# Patient Record
Sex: Male | Born: 1960 | Race: White | Hispanic: No | Marital: Married | Smoking: Former smoker
Health system: Southern US, Community
[De-identification: ages and names within clinical notes are randomized; demographics above are authoritative.]

## PROBLEM LIST (undated history)

## (undated) DIAGNOSIS — F32A Depression, unspecified: Secondary | ICD-10-CM

## (undated) DIAGNOSIS — K6289 Other specified diseases of anus and rectum: Secondary | ICD-10-CM

## (undated) DIAGNOSIS — C4359 Malignant melanoma of other part of trunk: Secondary | ICD-10-CM

## (undated) DIAGNOSIS — Z87820 Personal history of traumatic brain injury: Secondary | ICD-10-CM

## (undated) DIAGNOSIS — M1990 Unspecified osteoarthritis, unspecified site: Secondary | ICD-10-CM

## (undated) DIAGNOSIS — F329 Major depressive disorder, single episode, unspecified: Secondary | ICD-10-CM

## (undated) DIAGNOSIS — J189 Pneumonia, unspecified organism: Secondary | ICD-10-CM

## (undated) DIAGNOSIS — K219 Gastro-esophageal reflux disease without esophagitis: Secondary | ICD-10-CM

## (undated) DIAGNOSIS — F419 Anxiety disorder, unspecified: Secondary | ICD-10-CM

## (undated) DIAGNOSIS — R32 Unspecified urinary incontinence: Secondary | ICD-10-CM

## (undated) HISTORY — PX: TONSILLECTOMY: SUR1361

## (undated) HISTORY — DX: Unspecified urinary incontinence: R32

## (undated) HISTORY — PX: SUBTALAR JOINT ARTHROEREISIS: SHX6201

## (undated) HISTORY — DX: Personal history of traumatic brain injury: Z87.820

## (undated) HISTORY — PX: LAPAROSCOPIC CHOLECYSTECTOMY: SUR755

## (undated) HISTORY — DX: Other specified diseases of anus and rectum: K62.89

## (undated) HISTORY — PX: FRACTURE SURGERY: SHX138

## (undated) HISTORY — DX: Malignant melanoma of other part of trunk: C43.59

## (undated) HISTORY — PX: ANKLE ARTHROSCOPY: SHX545

## (undated) HISTORY — DX: Major depressive disorder, single episode, unspecified: F32.9

---

## 1980-12-03 HISTORY — PX: ANKLE FRACTURE SURGERY: SHX122

## 2011-06-19 ENCOUNTER — Other Ambulatory Visit: Payer: Self-pay | Admitting: Orthopedic Surgery

## 2011-06-19 ENCOUNTER — Ambulatory Visit
Admission: RE | Admit: 2011-06-19 | Discharge: 2011-06-19 | Disposition: A | Discharge status: Home / Self Care | Payer: BC Managed Care – PPO | Source: Ambulatory Visit | Attending: Orthopedic Surgery | Admitting: Orthopedic Surgery

## 2011-06-19 DIAGNOSIS — M25572 Pain in left ankle and joints of left foot: Secondary | ICD-10-CM

## 2011-06-19 DIAGNOSIS — M25579 Pain in unspecified ankle and joints of unspecified foot: Secondary | ICD-10-CM

## 2012-09-21 ENCOUNTER — Ambulatory Visit: Payer: BC Managed Care – PPO | Attending: Orthopedic Surgery | Admitting: Rehabilitation

## 2012-09-21 DIAGNOSIS — M25673 Stiffness of unspecified ankle, not elsewhere classified: Secondary | ICD-10-CM | POA: Insufficient documentation

## 2012-09-21 DIAGNOSIS — IMO0001 Reserved for inherently not codable concepts without codable children: Secondary | ICD-10-CM | POA: Insufficient documentation

## 2012-09-21 DIAGNOSIS — M25676 Stiffness of unspecified foot, not elsewhere classified: Secondary | ICD-10-CM | POA: Insufficient documentation

## 2012-09-21 DIAGNOSIS — M25579 Pain in unspecified ankle and joints of unspecified foot: Secondary | ICD-10-CM | POA: Insufficient documentation

## 2012-10-03 ENCOUNTER — Ambulatory Visit: Payer: BC Managed Care – PPO | Admitting: Rehabilitation

## 2012-10-06 ENCOUNTER — Ambulatory Visit: Payer: BC Managed Care – PPO | Attending: Orthopedic Surgery | Admitting: Rehabilitation

## 2012-10-06 DIAGNOSIS — M25579 Pain in unspecified ankle and joints of unspecified foot: Secondary | ICD-10-CM | POA: Insufficient documentation

## 2012-10-06 DIAGNOSIS — M25676 Stiffness of unspecified foot, not elsewhere classified: Secondary | ICD-10-CM | POA: Insufficient documentation

## 2012-10-06 DIAGNOSIS — IMO0001 Reserved for inherently not codable concepts without codable children: Secondary | ICD-10-CM | POA: Insufficient documentation

## 2012-10-06 DIAGNOSIS — M25673 Stiffness of unspecified ankle, not elsewhere classified: Secondary | ICD-10-CM | POA: Insufficient documentation

## 2012-10-10 ENCOUNTER — Ambulatory Visit: Payer: BC Managed Care – PPO | Admitting: Rehabilitation

## 2012-10-13 ENCOUNTER — Ambulatory Visit: Payer: BC Managed Care – PPO | Admitting: Physical Therapy

## 2012-10-17 ENCOUNTER — Ambulatory Visit: Payer: BC Managed Care – PPO | Admitting: Rehabilitation

## 2012-10-20 ENCOUNTER — Ambulatory Visit: Payer: BC Managed Care – PPO | Admitting: Rehabilitation

## 2012-10-24 ENCOUNTER — Ambulatory Visit: Payer: BC Managed Care – PPO | Admitting: Rehabilitation

## 2012-10-27 ENCOUNTER — Ambulatory Visit: Payer: BC Managed Care – PPO | Admitting: Rehabilitation

## 2012-10-31 ENCOUNTER — Ambulatory Visit: Payer: BC Managed Care – PPO | Admitting: Rehabilitation

## 2012-11-03 ENCOUNTER — Ambulatory Visit: Payer: BC Managed Care – PPO | Admitting: Rehabilitation

## 2012-11-07 ENCOUNTER — Ambulatory Visit: Payer: BC Managed Care – PPO | Attending: Orthopedic Surgery | Admitting: Rehabilitation

## 2012-11-07 DIAGNOSIS — IMO0001 Reserved for inherently not codable concepts without codable children: Secondary | ICD-10-CM | POA: Insufficient documentation

## 2012-11-07 DIAGNOSIS — M25579 Pain in unspecified ankle and joints of unspecified foot: Secondary | ICD-10-CM | POA: Insufficient documentation

## 2012-11-07 DIAGNOSIS — M25676 Stiffness of unspecified foot, not elsewhere classified: Secondary | ICD-10-CM | POA: Insufficient documentation

## 2012-11-07 DIAGNOSIS — M25673 Stiffness of unspecified ankle, not elsewhere classified: Secondary | ICD-10-CM | POA: Insufficient documentation

## 2012-11-10 ENCOUNTER — Ambulatory Visit: Payer: BC Managed Care – PPO | Admitting: Rehabilitation

## 2012-11-14 ENCOUNTER — Ambulatory Visit: Payer: BC Managed Care – PPO | Admitting: Rehabilitation

## 2012-11-17 ENCOUNTER — Ambulatory Visit: Payer: BC Managed Care – PPO | Admitting: Rehabilitation

## 2012-11-21 ENCOUNTER — Ambulatory Visit: Payer: BC Managed Care – PPO | Admitting: Rehabilitation

## 2012-11-24 ENCOUNTER — Ambulatory Visit: Payer: BC Managed Care – PPO | Admitting: Rehabilitation

## 2012-11-28 ENCOUNTER — Ambulatory Visit: Payer: BC Managed Care – PPO | Admitting: Rehabilitation

## 2012-12-01 ENCOUNTER — Encounter: Payer: BC Managed Care – PPO | Admitting: Rehabilitation

## 2012-12-05 ENCOUNTER — Encounter: Payer: BC Managed Care – PPO | Admitting: Rehabilitation

## 2012-12-08 ENCOUNTER — Ambulatory Visit: Payer: BC Managed Care – PPO | Attending: Orthopedic Surgery | Admitting: Rehabilitation

## 2012-12-08 DIAGNOSIS — IMO0001 Reserved for inherently not codable concepts without codable children: Secondary | ICD-10-CM | POA: Insufficient documentation

## 2012-12-08 DIAGNOSIS — M25673 Stiffness of unspecified ankle, not elsewhere classified: Secondary | ICD-10-CM | POA: Insufficient documentation

## 2012-12-08 DIAGNOSIS — M25579 Pain in unspecified ankle and joints of unspecified foot: Secondary | ICD-10-CM | POA: Insufficient documentation

## 2012-12-08 DIAGNOSIS — M25676 Stiffness of unspecified foot, not elsewhere classified: Secondary | ICD-10-CM | POA: Insufficient documentation

## 2012-12-12 ENCOUNTER — Ambulatory Visit: Payer: BC Managed Care – PPO | Admitting: Rehabilitation

## 2012-12-14 ENCOUNTER — Encounter: Payer: BC Managed Care – PPO | Admitting: Rehabilitation

## 2012-12-14 ENCOUNTER — Ambulatory Visit: Payer: BC Managed Care – PPO | Admitting: Rehabilitation

## 2012-12-15 ENCOUNTER — Encounter: Payer: BC Managed Care – PPO | Admitting: Rehabilitation

## 2012-12-26 ENCOUNTER — Ambulatory Visit: Payer: BC Managed Care – PPO | Admitting: Rehabilitation

## 2012-12-28 ENCOUNTER — Ambulatory Visit: Payer: BC Managed Care – PPO | Admitting: Rehabilitation

## 2013-01-04 ENCOUNTER — Ambulatory Visit: Payer: BC Managed Care – PPO | Attending: Orthopedic Surgery | Admitting: Rehabilitation

## 2013-01-04 DIAGNOSIS — M25579 Pain in unspecified ankle and joints of unspecified foot: Secondary | ICD-10-CM | POA: Insufficient documentation

## 2013-01-04 DIAGNOSIS — M25673 Stiffness of unspecified ankle, not elsewhere classified: Secondary | ICD-10-CM | POA: Insufficient documentation

## 2013-01-04 DIAGNOSIS — IMO0001 Reserved for inherently not codable concepts without codable children: Secondary | ICD-10-CM | POA: Insufficient documentation

## 2013-01-04 DIAGNOSIS — M25676 Stiffness of unspecified foot, not elsewhere classified: Secondary | ICD-10-CM | POA: Insufficient documentation

## 2013-01-10 ENCOUNTER — Ambulatory Visit: Payer: BC Managed Care – PPO | Admitting: Rehabilitation

## 2013-01-16 ENCOUNTER — Ambulatory Visit: Payer: BC Managed Care – PPO | Admitting: Rehabilitation

## 2013-01-23 ENCOUNTER — Encounter: Payer: BC Managed Care – PPO | Admitting: Rehabilitation

## 2015-06-07 DIAGNOSIS — J3089 Other allergic rhinitis: Secondary | ICD-10-CM | POA: Insufficient documentation

## 2015-06-07 DIAGNOSIS — F411 Generalized anxiety disorder: Secondary | ICD-10-CM | POA: Insufficient documentation

## 2015-06-07 DIAGNOSIS — B009 Herpesviral infection, unspecified: Secondary | ICD-10-CM | POA: Insufficient documentation

## 2015-06-07 HISTORY — DX: Other allergic rhinitis: J30.89

## 2015-06-07 HISTORY — DX: Generalized anxiety disorder: F41.1

## 2015-06-07 HISTORY — DX: Herpesviral infection, unspecified: B00.9

## 2015-09-11 ENCOUNTER — Other Ambulatory Visit: Payer: Self-pay | Admitting: Orthopedic Surgery

## 2015-09-11 DIAGNOSIS — M19072 Primary osteoarthritis, left ankle and foot: Secondary | ICD-10-CM

## 2015-09-16 ENCOUNTER — Ambulatory Visit
Admission: RE | Admit: 2015-09-16 | Discharge: 2015-09-16 | Disposition: A | Discharge status: Home / Self Care | Payer: Self-pay | Source: Ambulatory Visit | Attending: Orthopedic Surgery | Admitting: Orthopedic Surgery

## 2015-09-16 DIAGNOSIS — M19072 Primary osteoarthritis, left ankle and foot: Secondary | ICD-10-CM

## 2015-10-06 DIAGNOSIS — J189 Pneumonia, unspecified organism: Secondary | ICD-10-CM

## 2015-10-06 HISTORY — DX: Pneumonia, unspecified organism: J18.9

## 2016-06-14 DIAGNOSIS — E782 Mixed hyperlipidemia: Secondary | ICD-10-CM

## 2016-06-14 HISTORY — DX: Mixed hyperlipidemia: E78.2

## 2016-08-18 ENCOUNTER — Ambulatory Visit
Admission: RE | Admit: 2016-08-18 | Discharge: 2016-08-18 | Disposition: A | Discharge status: Home / Self Care | Payer: BC Managed Care – PPO | Source: Ambulatory Visit | Attending: Physician Assistant | Admitting: Physician Assistant

## 2016-08-18 ENCOUNTER — Other Ambulatory Visit: Payer: Self-pay | Admitting: Physician Assistant

## 2016-08-18 DIAGNOSIS — M546 Pain in thoracic spine: Secondary | ICD-10-CM

## 2017-01-21 ENCOUNTER — Ambulatory Visit (INDEPENDENT_AMBULATORY_CARE_PROVIDER_SITE_OTHER): Payer: BC Managed Care – PPO | Admitting: Orthopedic Surgery

## 2017-01-21 ENCOUNTER — Ambulatory Visit (INDEPENDENT_AMBULATORY_CARE_PROVIDER_SITE_OTHER): Payer: BC Managed Care – PPO

## 2017-01-21 DIAGNOSIS — M25572 Pain in left ankle and joints of left foot: Secondary | ICD-10-CM

## 2017-01-21 DIAGNOSIS — M24672 Ankylosis, left ankle: Secondary | ICD-10-CM

## 2017-01-21 DIAGNOSIS — M79671 Pain in right foot: Secondary | ICD-10-CM | POA: Diagnosis not present

## 2017-01-21 HISTORY — DX: Ankylosis, left ankle: M24.672

## 2017-01-21 HISTORY — DX: Pain in right foot: M79.671

## 2017-01-21 NOTE — Progress Notes (Signed)
Office Visit Note   Patient: Tom White           Date of Birth: 1960/12/28           MRN: [id number] Visit Date: 01/21/2017              Requested by: No referring provider defined for this encounter. PCP: Chesley Noon, MD  Chief Complaint  Patient presents with  . Right Ankle - Pain  . Left Ankle - Pain      HPI: Patient is a 56 year old gentleman who complains of subtalar pain on the left as well as posterior pain along the subtalar joint. Patient also complains of calcaneal pain on the right side. Patient states that he may take a job starting in a month which would require him to be on his feet and using steel toed shoes.  Assessment & Plan: Visit Diagnoses:  1. Inflammatory heel pain, right   2. Acute left ankle pain   3. Fibrosis of subtalar joint, left     Plan: Recommended a stiff soled walking or running shoe with orthotics to help cushion the stress reaction in the right calcaneus. Recommended using an ASO stabilizer on the left foot. Patient states that his subtalar joint is less painful with an ASO. Discussed that if we proceeded with revision subtalar fusion from a posterior approach the patient would be off his foot for about 2 months. Patient states he cannot take this much time off from his new job.  Follow-Up Instructions: Return if symptoms worsen or fail to improve.   Ortho Exam  Patient is alert, oriented, no adenopathy, well-dressed, normal affect, normal respiratory effort. Patient has a normal gait. He has good pulses bilaterally. Patient has pain reproduced with attempted subtalar motion on the left. He has pain to palpation over the subtalar joint posteriorly and over the sinus Tarsi this reproduces his symptoms. Patient has no tenderness to palpation over the Achilles posterior tibial tendon or peroneal tendons on the left. Examination of right foot patient is point tender with lateral compression of the calcaneus. The tarsal tunnel is nontender to  palpation the Achilles tendon is nontender to palpation the plantar fascias nontender to palpation the peroneal or posterior tibial tendon are nontender to palpation.  Imaging: Xr Ankle Complete Left  Result Date: 01/21/2017 2 view radiographs of the left ankle shows some mild arthritic changes of the tibial talar joint. Patient has advanced subtalar arthrosis status post attempted subtalar fusion with a prominent staple in place.  Xr Foot 2 Views Right  Result Date: 01/21/2017 2 view radiographs of the right foot shows no evidence of any stress fractures of the calcaneus there is no bony abnormalities.   Labs: No results found for: HGBA1C, ESRSEDRATE, CRP, LABURIC, REPTSTATUS, GRAMSTAIN, CULT, LABORGA  Orders:  Orders Placed This Encounter  Procedures  . XR Ankle Complete Left  . XR Foot 2 Views Right   No orders of the defined types were placed in this encounter.    Procedures: No procedures performed  Clinical Data: No additional findings.  ROS:  All other systems negative, except as noted in the HPI. Review of Systems  Objective: Vital Signs: There were no vitals taken for this visit.  Specialty Comments:  No specialty comments available.  PMFS History: Patient Active Problem List   Diagnosis Date Noted  . 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 Inflammatory heel pain, right 01/21/2017   No past medical history on file.  No family  history on file.  No past surgical history on file. Social History   Occupational History  . Not on file.   Social History Main Topics  . Smoking status: Not on file  . Smokeless tobacco: Not on file  . Alcohol use Not on file  . Drug use: Unknown  . Sexual activity: Not on file

## 2017-05-03 ENCOUNTER — Emergency Department (HOSPITAL_COMMUNITY): Payer: BC Managed Care – PPO

## 2017-05-03 ENCOUNTER — Encounter (HOSPITAL_COMMUNITY)
Admission: EM | Disposition: A | Discharge status: Home / Self Care | Payer: Self-pay | Source: Home / Self Care | Attending: Emergency Medicine

## 2017-05-03 ENCOUNTER — Emergency Department (HOSPITAL_COMMUNITY): Payer: BC Managed Care – PPO | Admitting: Certified Registered Nurse Anesthetist

## 2017-05-03 ENCOUNTER — Observation Stay (HOSPITAL_COMMUNITY)
Admission: EM | Admit: 2017-05-03 | Discharge: 2017-05-04 | Disposition: A | Discharge status: Home / Self Care | Payer: BC Managed Care – PPO | Attending: Orthopaedic Surgery | Admitting: Orthopaedic Surgery

## 2017-05-03 ENCOUNTER — Encounter (HOSPITAL_COMMUNITY): Payer: Self-pay | Admitting: *Deleted

## 2017-05-03 DIAGNOSIS — S91319A Laceration without foreign body, unspecified foot, initial encounter: Secondary | ICD-10-CM | POA: Diagnosis present

## 2017-05-03 DIAGNOSIS — Z881 Allergy status to other antibiotic agents status: Secondary | ICD-10-CM | POA: Insufficient documentation

## 2017-05-03 DIAGNOSIS — K219 Gastro-esophageal reflux disease without esophagitis: Secondary | ICD-10-CM | POA: Diagnosis not present

## 2017-05-03 DIAGNOSIS — S86121A Laceration of other muscle(s) and tendon(s) of posterior muscle group at lower leg level, right leg, initial encounter: Secondary | ICD-10-CM | POA: Diagnosis not present

## 2017-05-03 DIAGNOSIS — Z79899 Other long term (current) drug therapy: Secondary | ICD-10-CM | POA: Insufficient documentation

## 2017-05-03 DIAGNOSIS — S91311A Laceration without foreign body, right foot, initial encounter: Secondary | ICD-10-CM | POA: Diagnosis not present

## 2017-05-03 DIAGNOSIS — M1990 Unspecified osteoarthritis, unspecified site: Secondary | ICD-10-CM | POA: Insufficient documentation

## 2017-05-03 DIAGNOSIS — W293XXA Contact with powered garden and outdoor hand tools and machinery, initial encounter: Secondary | ICD-10-CM | POA: Insufficient documentation

## 2017-05-03 DIAGNOSIS — F419 Anxiety disorder, unspecified: Secondary | ICD-10-CM | POA: Insufficient documentation

## 2017-05-03 HISTORY — DX: Gastro-esophageal reflux disease without esophagitis: K21.9

## 2017-05-03 HISTORY — DX: Pneumonia, unspecified organism: J18.9

## 2017-05-03 HISTORY — DX: Depression, unspecified: F32.A

## 2017-05-03 HISTORY — PX: POSTERIOR TIBIAL TENDON REPAIR: SHX6039

## 2017-05-03 HISTORY — DX: Unspecified osteoarthritis, unspecified site: M19.90

## 2017-05-03 HISTORY — PX: I & D EXTREMITY: SHX5045

## 2017-05-03 HISTORY — DX: Anxiety disorder, unspecified: F41.9

## 2017-05-03 HISTORY — DX: Major depressive disorder, single episode, unspecified: F32.9

## 2017-05-03 HISTORY — PX: APPLICATION OF WOUND VAC: SHX5189

## 2017-05-03 LAB — BASIC METABOLIC PANEL
Anion gap: 8 (ref 5–15)
BUN: 11 mg/dL (ref 6–20)
CO2: 26 mmol/L (ref 22–32)
Calcium: 8.7 mg/dL — ABNORMAL LOW (ref 8.9–10.3)
Chloride: 104 mmol/L (ref 101–111)
Creatinine, Ser: 0.95 mg/dL (ref 0.61–1.24)
GFR calc Af Amer: >60 mL/min (ref >60)
GFR calc non Af Amer: >60 mL/min (ref >60)
Glucose, Bld: 106 mg/dL — ABNORMAL HIGH (ref 65–99)
Potassium: 4 mmol/L (ref 3.5–5.1)
Sodium: 138 mmol/L (ref 135–145)

## 2017-05-03 LAB — CBC WITH DIFFERENTIAL/PLATELET
Basophils Absolute: 0 10*3/uL (ref 0.0–0.1)
Basophils Relative: 0 %
Eosinophils Absolute: 0.1 10*3/uL (ref 0.0–0.7)
Eosinophils Relative: 1 %
HCT: 40.7 % (ref 39.0–52.0)
Hemoglobin: 14 g/dL (ref 13.0–17.0)
Lymphocytes Relative: 15 %
Lymphs Abs: 1 10*3/uL (ref 0.7–4.0)
MCH: 29.9 pg (ref 26.0–34.0)
MCHC: 34.4 g/dL (ref 30.0–36.0)
MCV: 86.8 fL (ref 78.0–100.0)
Monocytes Absolute: 0.6 10*3/uL (ref 0.1–1.0)
Monocytes Relative: 9 %
Neutro Abs: 5.1 10*3/uL (ref 1.7–7.7)
Neutrophils Relative %: 75 %
Platelets: 183 10*3/uL (ref 150–400)
RBC: 4.69 MIL/uL (ref 4.22–5.81)
RDW: 12.6 % (ref 11.5–15.5)
WBC: 6.8 10*3/uL (ref 4.0–10.5)

## 2017-05-03 SURGERY — IRRIGATION AND DEBRIDEMENT EXTREMITY
Anesthesia: General | Site: Foot | Laterality: Right

## 2017-05-03 MED ORDER — SUCCINYLCHOLINE CHLORIDE 20 MG/ML IJ SOLN
INTRAMUSCULAR | Status: DC | PRN
  Administered 2017-05-03: 120 mg via INTRAVENOUS

## 2017-05-03 MED ORDER — METOCLOPRAMIDE HCL 5 MG PO TABS: 5.0000 mg | ORAL_TABLET | Freq: Three times a day (TID) | ORAL | Status: DC | PRN

## 2017-05-03 MED ORDER — OXYCODONE HCL 5 MG PO TABS: 5.0000 mg | ORAL_TABLET | Freq: Once | ORAL | Status: DC | PRN

## 2017-05-03 MED ORDER — CLINDAMYCIN PHOSPHATE 900 MG/50ML IV SOLN
900.0000 mg | INTRAVENOUS | Status: AC
  Administered 2017-05-03: 900 mg via INTRAVENOUS

## 2017-05-03 MED ORDER — DOCUSATE SODIUM 100 MG PO CAPS
100.0000 mg | ORAL_CAPSULE | Freq: Two times a day (BID) | ORAL | Status: DC
  Administered 2017-05-03 – 2017-05-04 (×2): 100 mg via ORAL
  Filled 2017-05-03 (×2): qty 1

## 2017-05-03 MED ORDER — SODIUM CHLORIDE 0.9 % IR SOLN
Status: DC | PRN
  Administered 2017-05-03: 1000 mL
  Administered 2017-05-03: 3000 mL

## 2017-05-03 MED ORDER — LORAZEPAM 2 MG/ML IJ SOLN
1.0000 mg | Freq: Once | INTRAMUSCULAR | Status: AC
  Administered 2017-05-03: 1 mg via INTRAVENOUS
  Filled 2017-05-03: qty 1

## 2017-05-03 MED ORDER — LACTATED RINGERS IV SOLN
INTRAVENOUS | Status: DC | PRN
  Administered 2017-05-03: 16:00:00 via INTRAVENOUS

## 2017-05-03 MED ORDER — ONDANSETRON HCL 4 MG/2ML IJ SOLN: 4.0000 mg | Freq: Four times a day (QID) | INTRAMUSCULAR | Status: DC | PRN

## 2017-05-03 MED ORDER — HYDROMORPHONE HCL 1 MG/ML IJ SOLN
0.2500 mg | INTRAMUSCULAR | Status: DC | PRN
  Administered 2017-05-03: 0.5 mg via INTRAVENOUS

## 2017-05-03 MED ORDER — MIDAZOLAM HCL 2 MG/2ML IJ SOLN
INTRAMUSCULAR | Status: AC
  Filled 2017-05-03: qty 2

## 2017-05-03 MED ORDER — ONDANSETRON HCL 4 MG PO TABS: 4.0000 mg | ORAL_TABLET | Freq: Four times a day (QID) | ORAL | Status: DC | PRN

## 2017-05-03 MED ORDER — OXYCODONE HCL 5 MG/5ML PO SOLN: 5.0000 mg | Freq: Once | ORAL | Status: DC | PRN

## 2017-05-03 MED ORDER — PROPOFOL 10 MG/ML IV BOLUS
INTRAVENOUS | Status: DC | PRN
  Administered 2017-05-03: 200 mg via INTRAVENOUS

## 2017-05-03 MED ORDER — CLINDAMYCIN PHOSPHATE 600 MG/50ML IV SOLN
600.0000 mg | Freq: Once | INTRAVENOUS | Status: AC
  Administered 2017-05-03: 600 mg via INTRAVENOUS
  Filled 2017-05-03: qty 50

## 2017-05-03 MED ORDER — METHOCARBAMOL 500 MG PO TABS
500.0000 mg | ORAL_TABLET | Freq: Four times a day (QID) | ORAL | Status: DC | PRN
  Filled 2017-05-03: qty 1

## 2017-05-03 MED ORDER — SODIUM CHLORIDE 0.9 % IV SOLN: INTRAVENOUS | Status: DC

## 2017-05-03 MED ORDER — LIDOCAINE 2% (20 MG/ML) 5 ML SYRINGE
INTRAMUSCULAR | Status: AC
  Filled 2017-05-03: qty 5

## 2017-05-03 MED ORDER — ONDANSETRON HCL 4 MG/2ML IJ SOLN
INTRAMUSCULAR | Status: AC
  Filled 2017-05-03: qty 2

## 2017-05-03 MED ORDER — LIDOCAINE-EPINEPHRINE 1 %-1:100000 IJ SOLN
10.0000 mL | Freq: Once | INTRAMUSCULAR | Status: DC
  Filled 2017-05-03: qty 10

## 2017-05-03 MED ORDER — ACETAMINOPHEN 325 MG PO TABS: 650.0000 mg | ORAL_TABLET | Freq: Four times a day (QID) | ORAL | Status: DC | PRN

## 2017-05-03 MED ORDER — CLINDAMYCIN PHOSPHATE 600 MG/50ML IV SOLN
600.0000 mg | Freq: Four times a day (QID) | INTRAVENOUS | Status: AC
  Administered 2017-05-03 – 2017-05-04 (×3): 600 mg via INTRAVENOUS
  Filled 2017-05-03 (×3): qty 50

## 2017-05-03 MED ORDER — CHLORHEXIDINE GLUCONATE 4 % EX LIQD: 60.0000 mL | Freq: Once | CUTANEOUS | Status: DC

## 2017-05-03 MED ORDER — LIDOCAINE HCL (PF) 1 % IJ SOLN
INTRAMUSCULAR | Status: AC
  Administered 2017-05-03: 30 mL
  Filled 2017-05-03: qty 30

## 2017-05-03 MED ORDER — POLYETHYLENE GLYCOL 3350 17 G PO PACK: 17.0000 g | PACK | Freq: Every day | ORAL | Status: DC | PRN

## 2017-05-03 MED ORDER — DEXAMETHASONE SODIUM PHOSPHATE 10 MG/ML IJ SOLN
INTRAMUSCULAR | Status: DC | PRN
  Administered 2017-05-03: 5 mg via INTRAVENOUS

## 2017-05-03 MED ORDER — CITALOPRAM HYDROBROMIDE 20 MG PO TABS
20.0000 mg | ORAL_TABLET | Freq: Every day | ORAL | Status: DC
  Administered 2017-05-03: 20 mg via ORAL
  Filled 2017-05-03: qty 1

## 2017-05-03 MED ORDER — HYDROMORPHONE HCL 1 MG/ML IJ SOLN
INTRAMUSCULAR | Status: AC
  Administered 2017-05-03: 0.5 mg via INTRAVENOUS
  Filled 2017-05-03: qty 1

## 2017-05-03 MED ORDER — OXYCODONE HCL 5 MG PO TABS
5.0000 mg | ORAL_TABLET | ORAL | Status: DC | PRN
  Administered 2017-05-04: 5 mg via ORAL
  Filled 2017-05-03: qty 2
  Filled 2017-05-03: qty 1

## 2017-05-03 MED ORDER — DEXAMETHASONE SODIUM PHOSPHATE 10 MG/ML IJ SOLN
INTRAMUSCULAR | Status: AC
  Filled 2017-05-03: qty 1

## 2017-05-03 MED ORDER — METHOCARBAMOL 1000 MG/10ML IJ SOLN: 500.0000 mg | Freq: Four times a day (QID) | INTRAVENOUS | Status: DC | PRN

## 2017-05-03 MED ORDER — HYDROMORPHONE HCL 1 MG/ML IJ SOLN
1.0000 mg | Freq: Once | INTRAMUSCULAR | Status: AC
  Administered 2017-05-03: 1 mg via INTRAVENOUS
  Filled 2017-05-03: qty 1

## 2017-05-03 MED ORDER — SODIUM CHLORIDE 0.9 % IV BOLUS (SEPSIS)
1000.0000 mL | Freq: Once | INTRAVENOUS | Status: AC
  Administered 2017-05-03: 1000 mL via INTRAVENOUS

## 2017-05-03 MED ORDER — MIDAZOLAM HCL 5 MG/5ML IJ SOLN
INTRAMUSCULAR | Status: DC | PRN
  Administered 2017-05-03: 2 mg via INTRAVENOUS

## 2017-05-03 MED ORDER — POVIDONE-IODINE 10 % EX SWAB: 2.0000 "application " | Freq: Once | CUTANEOUS | Status: DC

## 2017-05-03 MED ORDER — ONDANSETRON HCL 4 MG/2ML IJ SOLN
INTRAMUSCULAR | Status: DC | PRN
  Administered 2017-05-03: 4 mg via INTRAVENOUS

## 2017-05-03 MED ORDER — FENTANYL CITRATE (PF) 100 MCG/2ML IJ SOLN
INTRAMUSCULAR | Status: DC | PRN
  Administered 2017-05-03: 50 ug via INTRAVENOUS
  Administered 2017-05-03: 150 ug via INTRAVENOUS

## 2017-05-03 MED ORDER — ACETAMINOPHEN 650 MG RE SUPP: 650.0000 mg | Freq: Four times a day (QID) | RECTAL | Status: DC | PRN

## 2017-05-03 MED ORDER — FENTANYL CITRATE (PF) 250 MCG/5ML IJ SOLN
INTRAMUSCULAR | Status: AC
  Filled 2017-05-03: qty 5

## 2017-05-03 MED ORDER — METOCLOPRAMIDE HCL 5 MG/ML IJ SOLN: 5.0000 mg | Freq: Three times a day (TID) | INTRAMUSCULAR | Status: DC | PRN

## 2017-05-03 MED ORDER — PROPOFOL 10 MG/ML IV BOLUS
INTRAVENOUS | Status: AC
  Filled 2017-05-03: qty 20

## 2017-05-03 MED ORDER — LIDOCAINE HCL (CARDIAC) 20 MG/ML IV SOLN
INTRAVENOUS | Status: DC | PRN
  Administered 2017-05-03: 80 mg via INTRAVENOUS

## 2017-05-03 MED ORDER — BUPROPION HCL ER (XL) 150 MG PO TB24
300.0000 mg | ORAL_TABLET | Freq: Every day | ORAL | Status: DC
  Administered 2017-05-04: 300 mg via ORAL
  Filled 2017-05-03: qty 2

## 2017-05-03 MED ORDER — HYDROMORPHONE HCL 1 MG/ML IJ SOLN
1.0000 mg | INTRAMUSCULAR | Status: DC | PRN
  Administered 2017-05-03: 1 mg via INTRAVENOUS
  Filled 2017-05-03: qty 1

## 2017-05-03 SURGICAL SUPPLY — 45 items
BAG DECANTER FOR FLEXI CONT (MISCELLANEOUS) ×3 IMPLANT
BANDAGE ACE 4X5 VEL STRL LF (GAUZE/BANDAGES/DRESSINGS) ×3 IMPLANT
BANDAGE ACE 6X5 VEL STRL LF (GAUZE/BANDAGES/DRESSINGS) ×3 IMPLANT
BNDG GAUZE ELAST 4 BULKY (GAUZE/BANDAGES/DRESSINGS) IMPLANT
CANISTER WOUND CARE 500ML ATS (WOUND CARE) ×3 IMPLANT
COVER SURGICAL LIGHT HANDLE (MISCELLANEOUS) ×3 IMPLANT
CUFF TOURNIQUET SINGLE 34IN LL (TOURNIQUET CUFF) ×3 IMPLANT
DRSG PAD ABDOMINAL 8X10 ST (GAUZE/BANDAGES/DRESSINGS) ×3 IMPLANT
ELECT REM PT RETURN 9FT ADLT (ELECTROSURGICAL) ×3
ELECTRODE REM PT RTRN 9FT ADLT (ELECTROSURGICAL) ×1 IMPLANT
GAUZE SPONGE 4X4 12PLY STRL (GAUZE/BANDAGES/DRESSINGS) ×3 IMPLANT
GAUZE SPONGE 4X4 12PLY STRL LF (GAUZE/BANDAGES/DRESSINGS) ×3 IMPLANT
GAUZE XEROFORM 5X9 LF (GAUZE/BANDAGES/DRESSINGS) ×3 IMPLANT
GLOVE BIOGEL PI IND STRL 8 (GLOVE) ×1 IMPLANT
GLOVE BIOGEL PI INDICATOR 8 (GLOVE) ×2
GLOVE ORTHO TXT STRL SZ7.5 (GLOVE) ×3 IMPLANT
GOWN STRL REUS W/ TWL LRG LVL3 (GOWN DISPOSABLE) ×1 IMPLANT
GOWN STRL REUS W/ TWL XL LVL3 (GOWN DISPOSABLE) ×1 IMPLANT
GOWN STRL REUS W/TWL 2XL LVL3 (GOWN DISPOSABLE) ×3 IMPLANT
GOWN STRL REUS W/TWL LRG LVL3 (GOWN DISPOSABLE) ×2
GOWN STRL REUS W/TWL XL LVL3 (GOWN DISPOSABLE) ×2
HANDPIECE INTERPULSE COAX TIP (DISPOSABLE) ×2
KIT BASIN OR (CUSTOM PROCEDURE TRAY) ×3 IMPLANT
KIT DRSG PREVENA PLUS 7DAY 125 (MISCELLANEOUS) ×3 IMPLANT
KIT PREVENA INCISION MGT 13 (CANNISTER) ×3 IMPLANT
KIT ROOM TURNOVER OR (KITS) ×3 IMPLANT
MANIFOLD NEPTUNE II (INSTRUMENTS) ×3 IMPLANT
NS IRRIG 1000ML POUR BTL (IV SOLUTION) ×3 IMPLANT
PACK ORTHO EXTREMITY (CUSTOM PROCEDURE TRAY) ×3 IMPLANT
PAD ARMBOARD 7.5X6 YLW CONV (MISCELLANEOUS) ×6 IMPLANT
PADDING CAST COTTON 6X4 STRL (CAST SUPPLIES) ×6 IMPLANT
SET HNDPC FAN SPRY TIP SCT (DISPOSABLE) ×1 IMPLANT
SPLINT PLASTER CAST XFAST 5X30 (CAST SUPPLIES) ×1 IMPLANT
SPLINT PLASTER XFAST SET 5X30 (CAST SUPPLIES) ×2
SPONGE LAP 18X18 X RAY DECT (DISPOSABLE) ×3 IMPLANT
SUT ETHILON 2 0 FS 18 (SUTURE) ×9 IMPLANT
SUT ETHILON 4 0 PS 2 18 (SUTURE) ×12 IMPLANT
SUT VIC AB 0 CT1 27 (SUTURE) ×2
SUT VIC AB 0 CT1 27XBRD ANBCTR (SUTURE) ×1 IMPLANT
TOWEL OR 17X24 6PK STRL BLUE (TOWEL DISPOSABLE) ×3 IMPLANT
TOWEL OR 17X26 10 PK STRL BLUE (TOWEL DISPOSABLE) ×3 IMPLANT
TUBE CONNECTING 12'X1/4 (SUCTIONS) ×1
TUBE CONNECTING 12X1/4 (SUCTIONS) ×2 IMPLANT
UNDERPAD 30X30 (UNDERPADS AND DIAPERS) ×3 IMPLANT
YANKAUER SUCT BULB TIP NO VENT (SUCTIONS) ×3 IMPLANT

## 2017-05-03 NOTE — ED Triage Notes (Signed)
Per EMS, pt was cutting a small tree with a chainsaw, it kicked back hitting his R foot.  Bleeding controlled.

## 2017-05-03 NOTE — Transfer of Care (Signed)
Immediate Anesthesia Transfer of Care Note  Patient: Tom White  Procedure(s) Performed: Procedure(s): IRRIGATION AND DEBRIDEMENT EXTREMITY RIGHT, REPAIR OF POSTERIOR TIBIAL TENDON (Right) APPLICATION OF WOUND VAC (Right)  Patient Location: PACU  Anesthesia Type:General  Level of Consciousness: awake, alert  and patient cooperative  Airway & Oxygen Therapy: Patient Spontanous Breathing  Post-op Assessment: Report given to RN and Post -op Vital signs reviewed and stable  Post vital signs: Reviewed and stable  Last Vitals:  Vitals:   05/03/17 1345 05/03/17 1650  BP: 109/73 (!) 127/91  Pulse: (!) 58 86  Resp:  11  Temp:  (!) 36.3 C    Last Pain:  Vitals:   05/03/17 1650  TempSrc:   PainSc: (P) 0-No pain         Complications: No apparent anesthesia complications

## 2017-05-03 NOTE — ED Notes (Signed)
Patient transported to X-ray 

## 2017-05-03 NOTE — ED Provider Notes (Signed)
[facility] DEPT Provider Note   CSN: [id number] Arrival date & time: 05/03/17  1108  By signing my name below, I, Tom White, attest that this documentation has been prepared under the direction and in the presence of [name], MD. Electronically Signed: Reola White, ED Scribe. 05/03/17. 11:44 AM.  History   Chief Complaint Chief Complaint  Patient presents with  . Foot Injury   The history is provided by the patient. No language interpreter was used.   HPI Comments: Tom White is a 56 y.o. male BIB EMS, who presents to the Emergency Department complaining of a wound sustained to the right medial foot which occurred this morning prior to arrival. Bleeding is controlled w/ pressure dressing. Pt reports that he was working in his yard this morning with a chainsaw when the device kicked back and struck him over the right medial foot. He notes associated paraesthesias nd 8/10 pain the right great toe. Initially, he states that the blood was only oozing from his foot; however, this was controlled with a wrapped bandage. No other reported treatments were tried prior to coming into the ED. He denies numbness, weakness, or any other associated symptoms. Tetanus is UTD.   NPO is 10AM  History reviewed. No pertinent past medical history.  Patient Active Problem List   Diagnosis Date Noted  . 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 Inflammatory heel pain, right 01/21/2017   History reviewed. No pertinent surgical history.  Home Medications    Prior to Admission medications   Not on File   Family History No family history on file.  Social History Social History  Substance Use Topics  . Smoking status: Not on file  . Smokeless tobacco: Not on file  . Alcohol use Not on file   Allergies   Ancef [cefazolin]  Review of Systems Review of Systems  Musculoskeletal: Positive for myalgias.  Skin: Positive for wound.  Neurological: Negative for  weakness and numbness.  All other systems reviewed and are negative.  Physical Exam Updated Vital Signs BP 121/82 (BP Location: Left Arm)   Pulse 71   Temp 98.3 F (36.8 C) (Oral)   Resp 16   Ht 5\' 9"  (1.753 m)   Wt 180 lb (81.6 kg)   SpO2 100%   BMI 26.58 kg/m   Physical Exam  Constitutional: He appears well-developed and well-nourished.  HENT:  Head: Normocephalic.  Right Ear: External ear normal.  Left Ear: External ear normal.  Nose: Nose normal.  Eyes: Conjunctivae are normal. Right eye exhibits no discharge. Left eye exhibits no discharge.  Neck: Normal range of motion.  Cardiovascular: Normal rate, regular rhythm and normal heart sounds.   No murmur heard. Pulmonary/Chest: Effort normal and breath sounds normal. No respiratory distress. He has no wheezes. He has no rales.  Abdominal: Soft. There is no tenderness. There is no rebound and no guarding.  Musculoskeletal: He exhibits no edema.  Extensive macerated laceration to the medial aspect of the right foot. Grossly contaminated with woody debris. Brisk venous bleeding near the proximal aspect of the wound. Muscle and tendon visible. Can wiggle all toes. Sensation intact to all toes. Brisk capillary refill distally.   Neurological: He is alert. No cranial nerve deficit. Coordination normal.  Skin: Skin is warm and dry. No rash noted. No erythema. No pallor.  Psychiatric: He has a normal mood and affect. His behavior is normal.  Nursing note and vitals reviewed.  ED Treatments / Results  DIAGNOSTIC  STUDIES: Oxygen Saturation is 100% on RA, normal by my interpretation.   COORDINATION OF CARE: 11:44 AM-Discussed next steps with pt. Pt verbalized understanding and is agreeable with the plan.   Labs (all labs ordered are listed, but only abnormal results are displayed) Labs Reviewed  BASIC METABOLIC PANEL - Abnormal; Notable for the following:       Result Value   Glucose, Bld 106 (*)    Calcium 8.7 (*)    All  other components within normal limits  CBC WITH DIFFERENTIAL/PLATELET    EKG  EKG Interpretation None      Radiology Dg Foot Complete Right  Result Date: 05/03/2017 CLINICAL DATA:  Chainsaw injury 10 medial foot. EXAM: RIGHT FOOT COMPLETE - 3+ VIEW COMPARISON:  None. FINDINGS: Several foci project over the soft tissues of the right foot. A focus over the distal third toe appears be on the skin based on oblique imaging. Several seen at the base of the foot laterally are likely either on the skin or soft tissue calcifications given a medial injury. A linear density between the second and third metatarsals on oblique imaging is identified. A focus over the soft tissues of the great toe on AP imaging is identified as well. No other soft tissue abnormalities are noted. Dressing material is seen over the soft tissues of the medial mid and posterior foot. The medial aspect of the navicular bone is partially obscured on AP imaging but appears to be intact based on oblique imaging on lateral imaging. There is lucency over the medial navicular bone on the AP imaging thought to most likely be artifactual. No other evidence of fracture identified. IMPRESSION: No fractures identified. Several foci associated with the soft tissues as above are nonspecific. No soft tissue foci are seen in the region of the dressing to suggest foreign bodies. Recommend clinical correlation. Electronically Signed   By: Dorise Bullion III M.D   On: 05/03/2017 12:47    Procedures Procedures   LACERATION REPAIR Performed by: [name] Authorized by: [name] Consent: Verbal consent obtained. Risks and benefits: risks, benefits and alternatives were discussed Consent given by: patient Patient identity confirmed: provided demographic data Prepped and Draped in normal sterile fashion Wound explored  Laceration Location: medial R foot  Laceration Length: 10 cm  No Foreign Bodies seen or palpated  Anesthesia:  local infiltration  Local anesthetic: lidocaine 1% w/o epinephrine  Anesthetic total: 2 ml  Irrigation method: syringe Amount of cleaning: standard  Brisk venous bleeding from proximal/superior aspect of wound. Vessel ligated 5-0 vicryl rapide.   Wound was then gently irrigated with a liter of NS and a few visible pieces of woody debris were removed. No significant additional bleeding noted. Saline soak gauze then applied and foot loosely wrapped.   Patient tolerance: Patient tolerated the procedure well with no immediate complications.   Medications Ordered in ED Medications - No data to display  Initial Impression / Assessment and Plan / ED Course  I have reviewed the triage vital signs and the nursing notes.  Pertinent labs & imaging results that were available during my care of the patient were reviewed by me and considered in my medical decision making (see chart for details).     Macerated grossly contaminated laceration to R foot. I think this is going require ortho to take to OR for washout/debridement. Appears to involve muscle/tendon but wound is just too macerated and ongoing bleeding make it difficult to to clearly define anatomy.   NPO.  Cefazolin allergy. Clindamycin ordered. Tetanus is current. XR foot. Brisk venous bleeding from a couple areas and will take care of these and irrigate some in the ED. Ortho consult.   Final Clinical Impressions(s) / ED Diagnoses   Final diagnoses:  Laceration of right foot, initial encounter   New Prescriptions New Prescriptions   No medications on file   I personally preformed the services scribed in my presence. The recorded information has been reviewed is accurate. [name], MD.     [name], MD 05/03/17 [phone]

## 2017-05-03 NOTE — ED Notes (Signed)
ED Provider at bedside. 

## 2017-05-03 NOTE — Anesthesia Preprocedure Evaluation (Addendum)
Anesthesia Evaluation  Patient identified by MRN, date of birth, ID band Patient awake    Reviewed: Allergy & Precautions, H&P , NPO status , Patient's Chart, lab work & pertinent test results  Airway Mallampati: II   Neck ROM: full    Dental   Pulmonary neg pulmonary ROS,    breath sounds clear to auscultation       Cardiovascular negative cardio ROS   Rhythm:regular Rate:Normal     Neuro/Psych negative neurological ROS     GI/Hepatic negative GI ROS, Neg liver ROS,   Endo/Other  negative endocrine ROS  Renal/GU negative Renal ROS     Musculoskeletal negative musculoskeletal ROS (+)   Abdominal   Peds  Hematology negative hematology ROS (+)   Anesthesia Other Findings Day of surgery medications reviewed with the patient.  Reproductive/Obstetrics                            Anesthesia Physical Anesthesia Plan  ASA: II  Anesthesia Plan: General   Post-op Pain Management:    Induction: Intravenous  PONV Risk Score and Plan: 3 and Ondansetron, Dexamethasone, Midazolam and Propofol infusion  Airway Management Planned: LMA  Additional Equipment:   Intra-op Plan:   Post-operative Plan: Extubation in OR  Informed Consent: I have reviewed the patients History and Physical, chart, labs and discussed the procedure including the risks, benefits and alternatives for the proposed anesthesia with the patient or authorized representative who has indicated his/her understanding and acceptance.   Dental advisory given  Plan Discussed with: CRNA, Anesthesiologist and Surgeon  Anesthesia Plan Comments:        Anesthesia Quick Evaluation

## 2017-05-03 NOTE — Brief Op Note (Signed)
05/03/2017  4:44 PM  PATIENT:  Tom White  56 y.o. male  PRE-OPERATIVE DIAGNOSIS:  Right Foot Laceration  POST-OPERATIVE DIAGNOSIS:  Right Foot Laceration  PROCEDURE:  Procedure(s): IRRIGATION AND DEBRIDEMENT EXTREMITY RIGHT, REPAIR OF POSTERIOR TIBIAL TENDON (Right) APPLICATION OF WOUND VAC (Right)  SURGEON:  Surgeon(s) and Role:    * Marybelle Killings, MD - Primary  PHYSICIAN ASSISTANT: Benjiman Core pa-c   ANESTHESIA:   general  EBL:  Total I/O In: 1850 [I.V.:800; IV Piggyback:1050] Out: -   BLOOD ADMINISTERED:none  DRAINS: wound vac  LOCAL MEDICATIONS USED:  NONE  SPECIMEN:  No Specimen  DISPOSITION OF SPECIMEN:  N/A  COUNTS:  YES  TOURNIQUET:   Total Tourniquet Time Documented: Thigh (Right) - 27 minutes Total: Thigh (Right) - 27 minutes   DICTATION: .Viviann Spare Dictation  PLAN OF CARE: Admit for overnight observation  PATIENT DISPOSITION:  PACU - hemodynamically stable.

## 2017-05-03 NOTE — Op Note (Signed)
Preop diagnosis: Right foot chainsaw injury to the medial foot.  Postop diagnosis: Right foot 13 cm complex jagged chainsaw laceration to foot with partial posterior tibial tendon laceration.  Procedure: Right foot sharp excisional debridement of skin subcutaneous tissue, tendon. Repair of partial posterior tibial tendon laceration and closure of complex jagged wound 13 cm.  Surgeon: Rodell Perna M.D.  Assistant: Benjiman Core PA-C, present for the entire procedure and assistive in the closure of complex jagged laceration as well as posterior tibial tendon repair.  Anesthesia: Gen.  Tourniquet: 350 pressure times less than 1 hour.  Brief history this 56 year old male with sandals on a chainsaw slipped fell of his hand and one chainsaw was running injured his right foot with complex parallel jagged lacerations. Patient had a gaping wound that was 13 cm long and 4-5 cm wide with the copious bleeding. Pressure dressing was applied in the emergency room and he was taken to the operating room for rub exploration and repair.  After application of a tourniquet during Vance gray Betadine prep tourniquet was inflated due to bleeding. Usual extremity drapes were applied. Pulsatile lavage was used and sharp excisional debridement of multiple skin flap edges due to the parallel nature of the chainsaw laceration with devitalized areas of skin subcutaneous tissue and fascia debrided sharply. Portion of the plantar foot muscles devitalized had to be sharply excised. Cautery was used for small venous bleeders for coagulation. Posterior neurovascular bundle fortunately was not injured. Posterior tibial tendon had partial laceration with ragged edges and a portion of this had to be sharply debrided and then using #1 Vicryl suture repair side to side for split and the posterior tibial tendon after extensive debridement of the tendon of all foreign material multiple sutures are placed in the tendon for repair. With  inversion and eversion the tendon repair was satisfactory. Repeat pulsatile lavage total of 3 L was performed and numerous times he stopped intermittently for removal of foreign material and debris that was in the subcutaneous tissue which had to be excisionally debrided with the the pickups and scissors or pickups and 15 scalpel blade to remove all foreign material associated with the chainsaw injury. Once the wound was clean and with magnification no remaining foreign material was present further sharp excisional debridement of skin edges was performed and far near near far 2-0 nylon interrupted sutures were used for skin reapproximation. An excisional wound VAC was then applied over the top with good seal. 100 mm pressure was placed on the back with low leak rate. Postoperative dressing followed by a short leg splint was applied to protect the tendon repair and patient was transferred recovery room in stable condition.

## 2017-05-03 NOTE — H&P (Signed)
Tom White is an 56 y.o. male.   Chief Complaint: Foot laceration  RIGHT FOOT HPI: Tom White was using a chainsaw and dropped it on his sandaled foot, causing a large laceration. He came to the ED for management OF RIGHT FOOT LACERATION.   History reviewed. No pertinent past medical history.  Past Surgical History:  Procedure Laterality Date  . CHOLECYSTECTOMY    . FRACTURE SURGERY Left    L ankle  . TONSILLECTOMY      No family history on file. Social History:  reports that he has never smoked. He has never used smokeless tobacco. He reports that he does not drink alcohol or use drugs.  Allergies:  Allergies  Allergen Reactions  . Ancef [Cefazolin] Hives     (Not in a hospital admission)  Results for orders placed or performed during the hospital encounter of 05/03/17 (from the past 48 hour(s))  Basic metabolic panel     Status: Abnormal   Collection Time: 05/03/17  1:04 PM  Result Value Ref Range   Sodium 138 135 - 145 mmol/L   Potassium 4.0 3.5 - 5.1 mmol/L   Chloride 104 101 - 111 mmol/L   CO2 26 22 - 32 mmol/L   Glucose, Bld 106 (H) 65 - 99 mg/dL   BUN 11 6 - 20 mg/dL   Creatinine, Ser 0.95 0.61 - 1.24 mg/dL   Calcium 8.7 (L) 8.9 - 10.3 mg/dL   GFR calc non Af Amer >60 >60 mL/min   GFR calc Af Amer >60 >60 mL/min    Comment: (NOTE) The eGFR has been calculated using the CKD EPI equation. This calculation has not been validated in all clinical situations. eGFR's persistently <60 mL/min signify possible Chronic Kidney Disease.    Anion gap 8 5 - 15  CBC with Differential     Status: None   Collection Time: 05/03/17  1:04 PM  Result Value Ref Range   WBC 6.8 4.0 - 10.5 K/uL   RBC 4.69 4.22 - 5.81 MIL/uL   Hemoglobin 14.0 13.0 - 17.0 g/dL   HCT 40.7 39.0 - 52.0 %   MCV 86.8 78.0 - 100.0 fL   MCH 29.9 26.0 - 34.0 pg   MCHC 34.4 30.0 - 36.0 g/dL   RDW 12.6 11.5 - 15.5 %   Platelets 183 150 - 400 K/uL   Neutrophils Relative % 75 %   Neutro Abs 5.1 1.7 - 7.7 K/uL    Lymphocytes Relative 15 %   Lymphs Abs 1.0 0.7 - 4.0 K/uL   Monocytes Relative 9 %   Monocytes Absolute 0.6 0.1 - 1.0 K/uL   Eosinophils Relative 1 %   Eosinophils Absolute 0.1 0.0 - 0.7 K/uL   Basophils Relative 0 %   Basophils Absolute 0.0 0.0 - 0.1 K/uL   Dg Foot Complete Right  Result Date: 05/03/2017 CLINICAL DATA:  Chainsaw injury 10 medial foot. EXAM: RIGHT FOOT COMPLETE - 3+ VIEW COMPARISON:  None. FINDINGS: Several foci project over the soft tissues of the right foot. A focus over the distal third toe appears be on the skin based on oblique imaging. Several seen at the base of the foot laterally are likely either on the skin or soft tissue calcifications given a medial injury. A linear density between the second and third metatarsals on oblique imaging is identified. A focus over the soft tissues of the great toe on AP imaging is identified as well. No other soft tissue abnormalities are noted. Dressing material is seen  over the soft tissues of the medial mid and posterior foot. The medial aspect of the navicular bone is partially obscured on AP imaging but appears to be intact based on oblique imaging on lateral imaging. There is lucency over the medial navicular bone on the AP imaging thought to most likely be artifactual. No other evidence of fracture identified. IMPRESSION: No fractures identified. Several foci associated with the soft tissues as above are nonspecific. No soft tissue foci are seen in the region of the dressing to suggest foreign bodies. Recommend clinical correlation. Electronically Signed   By: Dorise Bullion III M.D   On: 05/03/2017 12:47    Review of Systems  Constitutional: Negative for weight loss.  HENT: Negative for ear discharge, ear pain, hearing loss and tinnitus.   Eyes: Negative for blurred vision, double vision, photophobia and pain.  Respiratory: Negative for cough, sputum production and shortness of breath.   Cardiovascular: Negative for chest pain.   Gastrointestinal: Negative for abdominal pain, nausea and vomiting.  Genitourinary: Negative for dysuria, flank pain, frequency and urgency.  Musculoskeletal: Positive for myalgias (Right foot). Negative for back pain, falls, joint pain and neck pain.  Neurological: Negative for dizziness, tingling, sensory change, focal weakness, loss of consciousness and headaches.  Endo/Heme/Allergies: Does not bruise/bleed easily.  Psychiatric/Behavioral: Negative for depression, memory loss and substance abuse. The patient is not nervous/anxious.     Blood pressure 108/74, pulse 68, temperature 98.3 F (36.8 C), temperature source Oral, resp. rate 16, height _0  (1.753 m), weight 81.6 kg (180 lb), SpO2 97 %. Physical Exam  Constitutional: He appears well-developed and well-nourished. No distress.  HENT:  Head: Normocephalic.  Eyes: Conjunctivae are normal. Right eye exhibits no discharge. Left eye exhibits no discharge. No scleral icterus.  Cardiovascular: Normal rate and regular rhythm.   Respiratory: Effort normal. No respiratory distress.  Musculoskeletal:  RLE Large irregular laceration medial foot, no ecchymosis or rash  No effusions  Knee stable to varus/ valgus and anterior/posterior stress  Sens DPN, SPN, TN intact  Motor EHL, ext, flex, evers 5/5  DP 2+, PT 2+, No significant edema     Neurological: He is alert.  Skin: Skin is warm and dry. He is not diaphoretic.  Psychiatric: He has a normal mood and affect. His behavior is normal.     Assessment/Plan Right foot laceration -- To the OR for I&D and repair by Dr. Lorin Mercy.    Lisette Abu, PA-C Orthopedic Surgery [phone] 05/03/2017, 1:54 PM

## 2017-05-03 NOTE — Interval H&P Note (Signed)
History and Physical Interval Note:  05/03/2017 3:10 PM  Tom White  has presented today for surgery, with the diagnosis of Right Foot Laceration  The various methods of treatment have been discussed with the patient and family. After consideration of risks, benefits and other options for treatment, the patient has consented to  Procedure(s): IRRIGATION AND DEBRIDEMENT EXTREMITY RIGHT FOOT REPAIR TENDON/NERVE/ARTERY POSSIBLE (Right) as a surgical intervention .  The patient's history has been reviewed, patient examined, no change in status, stable for surgery.  I have reviewed the patient's chart and labs.  Questions were answered to the patient's satisfaction.     Marybelle Killings

## 2017-05-03 NOTE — Progress Notes (Signed)
Pt admitted to the unit from pacu; pt A&O X4; pt oriented to the unit and room; RLE has compression dsg with wound vac clean, dry and intact. Pt neuro check wnl; VSS: IV intact and transfusing; foot pumps on; pt call light within reach; family at bedside; Foot of bed elevated up Dr. Lorin Mercy request and pt voices it help relieve pain a pressure. Will continue to closely monitor pt. Delia Heady RN

## 2017-05-03 NOTE — Anesthesia Procedure Notes (Signed)
Procedure Name: Intubation Date/Time: 05/03/2017 3:52 PM Performed by: Salli Quarry Kamiah Fite Pre-anesthesia Checklist: Patient identified, Emergency Drugs available, Suction available and Patient being monitored Patient Re-evaluated:Patient Re-evaluated prior to induction Oxygen Delivery Method: Circle System Utilized Preoxygenation: Pre-oxygenation with 100% oxygen Induction Type: IV induction and Rapid sequence Laryngoscope Size: Mac and 4 Grade View: Grade II Tube type: Oral Tube size: 7.5 mm Number of attempts: 1 Airway Equipment and Method: Stylet Placement Confirmation: ETT inserted through vocal cords under direct vision,  positive ETCO2 and breath sounds checked- equal and bilateral Secured at: 23 cm Tube secured with: Tape Dental Injury: Teeth and Oropharynx as per pre-operative assessment

## 2017-05-03 NOTE — ED Notes (Signed)
Informed Dr. Wilson Singer that pt's pain is an 8.

## 2017-05-04 ENCOUNTER — Encounter (HOSPITAL_COMMUNITY): Payer: Self-pay | Admitting: General Practice

## 2017-05-04 MED ORDER — CIPROFLOXACIN HCL 500 MG PO TABS: 500.0000 mg | ORAL_TABLET | Freq: Two times a day (BID) | ORAL | 0 refills | Status: AC

## 2017-05-04 MED ORDER — HYDROCODONE-ACETAMINOPHEN 5-325 MG PO TABS: 1.0000 | ORAL_TABLET | ORAL | 0 refills | Status: AC | PRN

## 2017-05-04 NOTE — Anesthesia Postprocedure Evaluation (Signed)
Anesthesia Post Note  Patient: Tom White  Procedure(s) Performed: Procedure(s) (LRB): IRRIGATION AND DEBRIDEMENT EXTREMITY RIGHT, REPAIR OF POSTERIOR TIBIAL TENDON (Right) APPLICATION OF WOUND VAC (Right)     Patient location during evaluation: PACU Anesthesia Type: General Level of consciousness: awake and alert and patient cooperative Pain management: pain level controlled Vital Signs Assessment: post-procedure vital signs reviewed and stable Respiratory status: spontaneous breathing and respiratory function stable Cardiovascular status: stable Anesthetic complications: no    Last Vitals:  Vitals:   05/03/17 2221 05/04/17 0300  BP: 117/75 106/69  Pulse: 87 88  Resp:    Temp: 36.5 C 36.4 C    Last Pain:  Vitals:   05/04/17 0300  TempSrc: Axillary  PainSc:                  [place]

## 2017-05-04 NOTE — Evaluation (Signed)
Physical Therapy Evaluation Patient Details Name: Tom White MRN: [id number] DOB: 1960-11-09 Today's Date: 05/04/2017   History of Present Illness  Pt is a 56 y/o male admitted secondary to R foot laceration after chainsaw kicked back on him. Pt is s/p I and D of R foot with repair of posterior tibial tendon with application of wound vac. PMH includes L ankle fx s/p surgery.   Clinical Impression  Pt s/p surgery above with deficits below. PTA, pt was independent with functional mobility. Upon eval, pt limited by weakness and decreased balance. Attempted to use crutches initially, however, pt very unsteady and required mod A to maintain balance. Pt decreased support to min guard with use of RW. Practiced gait and stair navigation this session and reviewed technique with use of RW for stair management. Pt's wife practiced with guarding RW as well. Pt will need RW at d/c home. No follow up PT recommended at this time, however, pt would benefit from outpatient PT once cleared by MD. Will continue to follow acutely to maximize functional mobility independence and safety.     Follow Up Recommendations Supervision for mobility/OOB;No PT follow up (would benefit from OP PT once cleared by MD)    Equipment Recommendations  Rolling walker with 5" wheels    Recommendations for Other Services       Precautions / Restrictions Precautions Precautions: Fall Restrictions Weight Bearing Restrictions: Yes RLE Weight Bearing: Non weight bearing      Mobility  Bed Mobility Overal bed mobility: Modified Independent             General bed mobility comments: Increased time, however, no assist required   Transfers Overall transfer level: Needs assistance Equipment used: Rolling walker (2 wheeled);Crutches Transfers: Sit to/from Stand Sit to Stand: Mod assist;Min guard         General transfer comment: Attempted transfer with crutches, however, pt extremely unsteady and required mod A to  maintain balance. Pt decreased assist to min guard with use of RW. Required verbal cues for safe hand placement with use of RW.   Ambulation/Gait Ambulation/Gait assistance: Min guard Ambulation Distance (Feet): 125 Feet Assistive device: Rolling walker (2 wheeled) Gait Pattern/deviations: Decreased dorsiflexion - right (hop to ) Gait velocity: Decreased Gait velocity interpretation: Below normal speed for age/gender General Gait Details: Slow, hop to pattern with use of RW. Min guard for safety. 125' X 2 with RW. Pt with some UE fatigue, therefore continuous gait distance limited. Able to maintain NWB on RLE appropriately.   Stairs Stairs: Yes Stairs assistance: Min guard Stair Management: One rail Left;With walker;Forwards;Backwards (hop to ) Number of Stairs: 4 General stair comments: Min guard for safety. Reviewed hop to pattern using RW for stair management. Reviewed appropriate guarding technique with wife and practiced stair navigation using RW. Administered handout as well. Also demonstrated bump up technique, if pt felt he needed that method as well. Verbal cues for sequencing and appropriate RW placement.   Wheelchair Mobility    Modified Rankin (Stroke Patients Only)       Balance Overall balance assessment: Needs assistance Sitting-balance support: No upper extremity supported;Feet supported Sitting balance-Leahy Scale: Good     Standing balance support: Bilateral upper extremity supported;During functional activity Standing balance-Leahy Scale: Poor Standing balance comment: Reliant on UE support for balance.                              Pertinent Vitals/Pain Pain Assessment:  0-10 Pain Score: 2  Pain Location: R foot  Pain Descriptors / Indicators: Aching;Operative site guarding;Sore Pain Intervention(s): Limited activity within patient's tolerance;Monitored during session;Repositioned    Home Living Family/patient expects to be discharged to::  Private residence Living Arrangements: Spouse/significant other Available Help at Discharge: Family;Available 24 hours/day Type of Home: House Home Access: Stairs to enter Entrance Stairs-Rails: Right;Left;Can reach both Entrance Stairs-Number of Steps: 3 Home Layout: Two level Home Equipment: Shower seat - built in;Hand held shower head      Prior Function Level of Independence: Independent               Hand Dominance   Dominant Hand: Right    Extremity/Trunk Assessment   Upper Extremity Assessment Upper Extremity Assessment: Overall WFL for tasks assessed    Lower Extremity Assessment Lower Extremity Assessment: RLE deficits/detail RLE Deficits / Details: Sensory in tact. Deficits consistent with post op pain and weakness.     Cervical / Trunk Assessment Cervical / Trunk Assessment: Normal  Communication   Communication: No difficulties  Cognition Arousal/Alertness: Awake/alert Behavior During Therapy: WFL for tasks assessed/performed Overall Cognitive Status: Within Functional Limits for tasks assessed                                        General Comments General comments (skin integrity, edema, etc.): Reviewed how to set up RW to appropriate height once pt received. Pt more stable and comfortable with use of RW.     Exercises     Assessment/Plan    PT Assessment Patient needs continued PT services  PT Problem List Decreased strength;Decreased range of motion;Decreased balance;Decreased mobility;Decreased knowledge of use of DME;Decreased knowledge of precautions;Pain       PT Treatment Interventions DME instruction;Gait training;Stair training;Functional mobility training;Therapeutic exercise;Therapeutic activities;Balance training;Neuromuscular re-education;Patient/family education    PT Goals (Current goals can be found in the Care Plan section)  Acute Rehab PT Goals Patient Stated Goal: to go home ASAP PT Goal Formulation: With  patient Time For Goal Achievement: 05/11/17 Potential to Achieve Goals: Good    Frequency Min 3X/week   Barriers to discharge        Co-evaluation               AM-PAC PT "6 Clicks" Daily Activity  Outcome Measure Difficulty turning over in bed (including adjusting bedclothes, sheets and blankets)?: A Little Difficulty moving from lying on back to sitting on the side of the bed? : A Little Difficulty sitting down on and standing up from a chair with arms (e.g., wheelchair, bedside commode, etc,.)?: Total Help needed moving to and from a bed to chair (including a wheelchair)?: A Little Help needed walking in hospital room?: A Little Help needed climbing 3-5 steps with a railing? : A Little 6 Click Score: 16    End of Session Equipment Utilized During Treatment: Gait belt Activity Tolerance: Patient tolerated treatment well Patient left: in bed;with call bell/phone within reach;with family/visitor present Nurse Communication: Mobility status PT Visit Diagnosis: Other abnormalities of gait and mobility (R26.89);Unsteadiness on feet (R26.81)    Time: 5573-2202 PT Time Calculation (min) (ACUTE ONLY): 47 min   Charges:   PT Evaluation $PT Eval Low Complexity: 1 Low PT Treatments $Gait Training: 8-22 mins   PT G Codes:   PT G-Codes **NOT FOR INPATIENT CLASS** Functional Assessment Tool Used: AM-PAC 6 Clicks Basic Mobility;Clinical judgement Functional Limitation:  Mobility: Walking and moving around Mobility: Walking and Moving Around Current Status [phone]): At least 40 percent but less than 60 percent impaired, limited or restricted Mobility: Walking and Moving Around Goal Status [phone]): At least 1 percent but less than 20 percent impaired, limited or restricted    Leighton Ruff, PT, DPT  Acute Rehabilitation Services  Pager: [place] 05/04/2017, 1:16 PM

## 2017-05-04 NOTE — Progress Notes (Signed)
   Subjective: 1 Day Post-Op Procedure(s) (LRB): IRRIGATION AND DEBRIDEMENT EXTREMITY RIGHT, REPAIR OF POSTERIOR TIBIAL TENDON (Right) APPLICATION OF WOUND VAC (Right) Patient reports pain as mild.    Objective: Vital signs in last 24 hours: Temp:  [97.4 F (36.3 C)-98.3 F (36.8 C)] 97.6 F (36.4 C) (07/31 0300) Pulse Rate:  [58-88] 88 (07/31 0300) Resp:  [11-16] 16 (07/30 1758) BP: (102-139)/(69-91) 106/69 (07/31 0300) SpO2:  [92 %-100 %] 94 % (07/31 0300) Weight:  [180 lb (81.6 kg)] 180 lb (81.6 kg) (07/30 1119)  Intake/Output from previous day: 07/30 0701 - 07/31 0700 In: 2330 [P.O.:480; I.V.:800; IV Piggyback:1050] Out: 1780 [Urine:1750; Blood:30] Intake/Output this shift: No intake/output data recorded.   Recent Labs  05/03/17 1304  HGB 14.0    Recent Labs  05/03/17 1304  WBC 6.8  RBC 4.69  HCT 40.7  PLT 183    Recent Labs  05/03/17 1304  NA 138  K 4.0  CL 104  CO2 26  BUN 11  CREATININE 0.95  GLUCOSE 106*  CALCIUM 8.7*   No results for input(s): LABPT, INR in the last 72 hours.  VAC good seal. prevena charging at his sink for home use.  Dg Foot Complete Right  Result Date: 05/03/2017 CLINICAL DATA:  Chainsaw injury 10 medial foot. EXAM: RIGHT FOOT COMPLETE - 3+ VIEW COMPARISON:  None. FINDINGS: Several foci project over the soft tissues of the right foot. A focus over the distal third toe appears be on the skin based on oblique imaging. Several seen at the base of the foot laterally are likely either on the skin or soft tissue calcifications given a medial injury. A linear density between the second and third metatarsals on oblique imaging is identified. A focus over the soft tissues of the great toe on AP imaging is identified as well. No other soft tissue abnormalities are noted. Dressing material is seen over the soft tissues of the medial mid and posterior foot. The medial aspect of the navicular bone is partially obscured on AP imaging but  appears to be intact based on oblique imaging on lateral imaging. There is lucency over the medial navicular bone on the AP imaging thought to most likely be artifactual. No other evidence of fracture identified. IMPRESSION: No fractures identified. Several foci associated with the soft tissues as above are nonspecific. No soft tissue foci are seen in the region of the dressing to suggest foreign bodies. Recommend clinical correlation. Electronically Signed   By: Dorise Bullion III M.D   On: 05/03/2017 12:47    Assessment/Plan: 1 Day Post-Op Procedure(s) (LRB): IRRIGATION AND DEBRIDEMENT EXTREMITY RIGHT, REPAIR OF POSTERIOR TIBIAL TENDON (Right) APPLICATION OF WOUND VAC (Right) Up with therapy, discharge home this afternoon. Office Wednesday.   Marybelle Killings 05/04/2017, 7:48 AM

## 2017-05-04 NOTE — Discharge Instructions (Signed)
No weight on right foot. Keep it elevated except when up to bathroom. See Dr. Lorin Mercy tomorrow in office for dressing change.

## 2017-05-04 NOTE — Progress Notes (Signed)
Notified by pt's nurse that [facility] continues to indicate a "leak". Ensured that all connectors and tubing were flush and connected tight. Spoke with Dr. Lorin Mercy and Arby Barrette with [employer], and tried various interventions (elevating pt's leg, adding tegaderm over the connector pieces, etc.) but was unable to get indicator light to go out. Instructed by Dr. Lorin Mercy that as long as VAC was audibly suctioning he was comfortable with letting pt go home since he had a follow up appointment at the office tomorrow morning. Pt and wife updated on plan and verbalized understanding. Will continue to monitor.

## 2017-05-04 NOTE — Progress Notes (Signed)
Pt discharge instructions reviewed with pt. Pt verbalizes understanding. Pt's family member is driving him home. Pt belongings with pt. Pt is not in distress. Pt discharged via wheelchair.

## 2017-05-05 ENCOUNTER — Encounter (INDEPENDENT_AMBULATORY_CARE_PROVIDER_SITE_OTHER): Payer: Self-pay | Admitting: Orthopaedic Surgery

## 2017-05-05 ENCOUNTER — Telehealth (INDEPENDENT_AMBULATORY_CARE_PROVIDER_SITE_OTHER): Payer: Self-pay | Admitting: Orthopaedic Surgery

## 2017-05-05 ENCOUNTER — Ambulatory Visit (INDEPENDENT_AMBULATORY_CARE_PROVIDER_SITE_OTHER): Payer: BC Managed Care – PPO | Admitting: Orthopaedic Surgery

## 2017-05-05 DIAGNOSIS — S91311D Laceration without foreign body, right foot, subsequent encounter: Secondary | ICD-10-CM

## 2017-05-05 NOTE — Telephone Encounter (Signed)
PT REQUESTED A CALL BACK REGARDING THE BOOT HE GOT, HIS TOES ARE NUMB NOW.  [phone]

## 2017-05-05 NOTE — Telephone Encounter (Signed)
I spoke with Dr. Lorin Mercy and called patient. Per Dr. Lorin Mercy, loosen the boot and he can loosen and rewrap the Ace Wrap to see if that helps. Patent states that he loosened two straps on boot and that has helped. I reminded about elevating the ankle.  Patient also takes Valium as needed for anxiety attacks. He wondered if this would be okay to take with the pain medicine. I explained that it could affect him, make his reflexes more slow, and make him an increased fall risk. Explained that he would need to be careful if and when he had to take both medications.

## 2017-05-05 NOTE — Progress Notes (Signed)
   Post-Op Visit Note   Patient: Tom White           Date of Birth: 05/18/1961           MRN: [id number] Visit Date: 05/05/2017 PCP: Chesley Noon, MD   Assessment & Plan: Post chain saw injury right foot with partial posterior tibial tendon laceration. Soft dressing followed by boot applied return 1 week for wound check.  Chief Complaint:  Chief Complaint  Patient presents with  . Right Leg - Follow-up    05/03/17 I &D Right lower extremity repair of posterior tendon repair.   Visit Diagnoses: Foot laceration secondary to chainsaw with partial posterior tibial tendon laceration and repair.  Plan: Return 1 week for wound check. Continue elevation.  Follow-Up Instructions: No Follow-up on file.   Orders:  No orders of the defined types were placed in this encounter.  No orders of the defined types were placed in this encounter.   Imaging: No results found.  PMFS History: Patient Active Problem List   Diagnosis Date Noted  . Foot laceration 05/03/2017  . 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 Inflammatory heel pain, right 01/21/2017   Past Medical History:  Diagnosis Date  . Anxiety   . Arthritis    "had it in my left ankle" (05/04/2017)  . GERD (gastroesophageal reflux disease)   . Pneumonia 2017    No family history on file.  Past Surgical History:  Procedure Laterality Date  . ANKLE ARTHROSCOPY Left ~ 2013   "scraped out arthritis"  . ANKLE FRACTURE SURGERY Left 12/1980  . APPLICATION OF WOUND VAC Right 05/03/2017   foot  . APPLICATION OF WOUND VAC Right 05/03/2017   Procedure: APPLICATION OF WOUND VAC;  Surgeon: Marybelle Killings, MD;  Location: [place];  Service: Orthopedics;  Laterality: Right;  . FRACTURE SURGERY    . I&D EXTREMITY Right 05/03/2017   foot  . I&D EXTREMITY Right 05/03/2017   Procedure: IRRIGATION AND DEBRIDEMENT EXTREMITY RIGHT, REPAIR OF POSTERIOR TIBIAL TENDON;  Surgeon: Marybelle Killings, MD;  Location: [place];  Service: Orthopedics;   Laterality: Right;  . [place]?  Marland Kitchen POSTERIOR TIBIAL TENDON REPAIR Right 05/03/2017  . SUBTALAR JOINT ARTHROEREISIS Left 1985?  . TONSILLECTOMY     Social History   Occupational History  . Not on file.   Social History Main Topics  . Smoking status: Former Smoker    Packs/day: 1.00    Years: 15.00    Types: Cigarettes    Quit date: 1995  . Smokeless tobacco: Never Used  . Alcohol use Yes     Comment: 05/04/2017 "recovering alcoholic since ~ 0932"  . Drug use: No  . Sexual activity: Not on file

## 2017-05-06 NOTE — Discharge Summary (Signed)
Patient ID: Tom White MRN: [id number] DOB/AGE: [birth year] 56 y.o.  Admit date: 05/03/2017 Discharge date: 05/06/2017  Admission Diagnoses:  Active Problems:   Foot laceration   Discharge Diagnoses:  Active Problems:   Foot laceration  status post Procedure(s): IRRIGATION AND DEBRIDEMENT EXTREMITY RIGHT, REPAIR OF POSTERIOR TIBIAL TENDON APPLICATION OF WOUND VAC  Past Medical History:  Diagnosis Date  . Anxiety   . Arthritis    "had it in my left ankle" (05/04/2017)  . GERD (gastroesophageal reflux disease)   . Pneumonia 2017    Surgeries: Procedure(s): IRRIGATION AND DEBRIDEMENT EXTREMITY RIGHT, REPAIR OF POSTERIOR TIBIAL TENDON APPLICATION OF WOUND VAC on 05/03/2017   Consultants: Treatment Team:  Marybelle Killings, MD  Discharged Condition: Improved  Hospital Course: Tom White is an 56 y.o. male who was admitted 05/03/2017 for operative treatment of foot laceration.. Patient failed conservative treatments (please see the history and physical for the specifics) and had severe unremitting pain that affects sleep, daily activities and work/hobbies. After pre-op clearance, the patient was taken to the operating room on 05/03/2017 and underwent  Procedure(s): IRRIGATION AND DEBRIDEMENT EXTREMITY RIGHT, REPAIR OF POSTERIOR TIBIAL TENDON APPLICATION OF WOUND VAC.    Patient was given perioperative antibiotics: Anti-infectives    Start     Dose/Rate Route Frequency Ordered Stop   05/04/17 0000  ciprofloxacin (CIPRO) 500 MG tablet     500 mg Oral 2 times daily 05/04/17 0754 05/09/17 2359   05/03/17 1900  clindamycin (CLEOCIN) IVPB 600 mg     600 mg 100 mL/hr over 30 Minutes Intravenous Every 6 hours 05/03/17 1801 05/04/17 0722   05/03/17 1445  clindamycin (CLEOCIN) IVPB 900 mg     900 mg 100 mL/hr over 30 Minutes Intravenous On call to O.R. 05/03/17 1432 05/03/17 1559   05/03/17 1200  clindamycin (CLEOCIN) IVPB 600 mg     600 mg 100 mL/hr over 30 Minutes Intravenous  Once  05/03/17 1154 05/03/17 1243       Patient was given sequential compression devices and early ambulation to prevent DVT.   Patient benefited maximally from hospital stay and there were no complications. At the time of discharge, the patient was urinating/moving their bowels without difficulty, tolerating a regular diet, pain is controlled with oral pain medications and they have been cleared by PT/OT.   Recent vital signs: No data found.    Recent laboratory studies:  Recent Labs  05/03/17 1304  WBC 6.8  HGB 14.0  HCT 40.7  PLT 183  NA 138  K 4.0  CL 104  CO2 26  BUN 11  CREATININE 0.95  GLUCOSE 106*  CALCIUM 8.7*     Discharge Medications:   Allergies as of 05/04/2017      Reactions   Ancef [cefazolin] Hives      Medication List    STOP taking these medications   acetaminophen 500 MG tablet Commonly known as:  TYLENOL     TAKE these medications   buPROPion 300 MG 24 hr tablet Commonly known as:  WELLBUTRIN XL Take 300 mg by mouth daily.   ciprofloxacin 500 MG tablet Commonly known as:  CIPRO Take 1 tablet (500 mg total) by mouth 2 (two) times daily.   citalopram 20 MG tablet Commonly known as:  CELEXA Take 20 mg by mouth at bedtime.   diazepam 10 MG tablet Commonly known as:  VALIUM Take 5 mg by mouth at bedtime as needed for anxiety or sleep.   HYDROcodone-acetaminophen 5-325  MG tablet Commonly known as:  NORCO Take 1-2 tablets by mouth every 4 (four) hours as needed for moderate pain.   ICY HOT 7.5 % (Roll) Misc Generic drug:  Menthol (Topical Analgesic) Apply 1 each topically daily as needed (muscle pain).   tiZANidine 4 MG tablet Commonly known as:  ZANAFLEX Take 4 mg by mouth every 6 (six) hours as needed for muscle spasms.       Diagnostic Studies: Dg Foot Complete Right  Result Date: 05/03/2017 CLINICAL DATA:  Chainsaw injury 10 medial foot. EXAM: RIGHT FOOT COMPLETE - 3+ VIEW COMPARISON:  None. FINDINGS: Several foci project over the  soft tissues of the right foot. A focus over the distal third toe appears be on the skin based on oblique imaging. Several seen at the base of the foot laterally are likely either on the skin or soft tissue calcifications given a medial injury. A linear density between the second and third metatarsals on oblique imaging is identified. A focus over the soft tissues of the great toe on AP imaging is identified as well. No other soft tissue abnormalities are noted. Dressing material is seen over the soft tissues of the medial mid and posterior foot. The medial aspect of the navicular bone is partially obscured on AP imaging but appears to be intact based on oblique imaging on lateral imaging. There is lucency over the medial navicular bone on the AP imaging thought to most likely be artifactual. No other evidence of fracture identified. IMPRESSION: No fractures identified. Several foci associated with the soft tissues as above are nonspecific. No soft tissue foci are seen in the region of the dressing to suggest foreign bodies. Recommend clinical correlation. Electronically Signed   By: Dorise Bullion III M.D   On: 05/03/2017 12:47      Follow-up Information    Marybelle Killings, MD Follow up in 1 day(s).   Specialty:  Orthopedic Surgery Why:  see Dr. Lorin Mercy on Wednesday in office Contact information: [place] [address] [phone]           Discharge Plan:  discharge to home  Disposition:     Signed: Montravious Weigelt  05/06/2017, 11:06 AM

## 2017-05-12 ENCOUNTER — Ambulatory Visit (INDEPENDENT_AMBULATORY_CARE_PROVIDER_SITE_OTHER): Payer: BC Managed Care – PPO | Admitting: Orthopaedic Surgery

## 2017-05-12 ENCOUNTER — Telehealth (INDEPENDENT_AMBULATORY_CARE_PROVIDER_SITE_OTHER): Payer: Self-pay | Admitting: Orthopaedic Surgery

## 2017-05-12 ENCOUNTER — Telehealth (INDEPENDENT_AMBULATORY_CARE_PROVIDER_SITE_OTHER): Payer: Self-pay

## 2017-05-12 DIAGNOSIS — S91311D Laceration without foreign body, right foot, subsequent encounter: Secondary | ICD-10-CM

## 2017-05-12 DIAGNOSIS — S96921D Laceration of unspecified muscle and tendon at ankle and foot level, right foot, subsequent encounter: Secondary | ICD-10-CM

## 2017-05-12 NOTE — Telephone Encounter (Signed)
Patient would like a work note stating that he will be out of work for about 6 weeks. Patient is not sure if Dr. Lorin Mercy said 6 weeks or not.  Could you clairfiy with Dr. Lorin Mercy first.  Cb# is [phone].  Please advise.  Thank You.

## 2017-05-12 NOTE — Telephone Encounter (Signed)
Please advise 

## 2017-05-12 NOTE — Progress Notes (Signed)
   Post-Op Visit Note   Patient: Tom White           Date of Birth: 05-May-1961           MRN: [id number] Visit Date: 05/12/2017 PCP: Chesley Noon, MD   Assessment & Plan: Return 1 week for likely suture removal Dr. Lorin Mercy to check first  Chief Complaint: No chief complaint on file.  Visit Diagnoses:  1. Laceration of right foot with tendon involvement, subsequent encounter     Plan: Return 1 week for wound. He can touchdown weight-bear.  Follow-Up Instructions: Return in about 1 week (around 05/19/2017).   Orders:  No orders of the defined types were placed in this encounter.  No orders of the defined types were placed in this encounter.   Imaging: No results found.  PMFS History: Patient Active Problem List   Diagnosis Date Noted  . Foot laceration 05/03/2017  . 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 Inflammatory heel pain, right 01/21/2017   Past Medical History:  Diagnosis Date  . Anxiety   . Arthritis    "had it in my left ankle" (05/04/2017)  . GERD (gastroesophageal reflux disease)   . Pneumonia 2017    No family history on file.  Past Surgical History:  Procedure Laterality Date  . ANKLE ARTHROSCOPY Left ~ 2013   "scraped out arthritis"  . ANKLE FRACTURE SURGERY Left 12/1980  . APPLICATION OF WOUND VAC Right 05/03/2017   foot  . APPLICATION OF WOUND VAC Right 05/03/2017   Procedure: APPLICATION OF WOUND VAC;  Surgeon: Marybelle Killings, MD;  Location: [facility];  Service: Orthopedics;  Laterality: Right;  . FRACTURE SURGERY    . I&D EXTREMITY Right 05/03/2017   foot  . I&D EXTREMITY Right 05/03/2017   Procedure: IRRIGATION AND DEBRIDEMENT EXTREMITY RIGHT, REPAIR OF POSTERIOR TIBIAL TENDON;  Surgeon: Marybelle Killings, MD;  Location: [facility];  Service: Orthopedics;  Laterality: Right;  . [place]?  Marland Kitchen POSTERIOR TIBIAL TENDON REPAIR Right 05/03/2017  . SUBTALAR JOINT ARTHROEREISIS Left 1985?  . TONSILLECTOMY     Social History     Occupational History  . Not on file.   Social History Main Topics  . Smoking status: Former Smoker    Packs/day: 1.00    Years: 15.00    Types: Cigarettes    Quit date: 1995  . Smokeless tobacco: Never Used  . Alcohol use Yes     Comment: 05/04/2017 "recovering alcoholic since ~ 5465"  . Drug use: No  . Sexual activity: Not on file

## 2017-05-12 NOTE — Telephone Encounter (Signed)
CAN YOU PLEASE OPEN UP A SPOT PER [facility]ATES NEXT WEEK. ANY DAY OR TIME AND JUST LET [place].  [phone]

## 2017-05-13 ENCOUNTER — Encounter (INDEPENDENT_AMBULATORY_CARE_PROVIDER_SITE_OTHER): Payer: Self-pay

## 2017-05-13 ENCOUNTER — Telehealth (INDEPENDENT_AMBULATORY_CARE_PROVIDER_SITE_OTHER): Payer: Self-pay | Admitting: Orthopaedic Surgery

## 2017-05-13 NOTE — Telephone Encounter (Signed)
Patient aware note faxed to provided number

## 2017-05-13 NOTE — Telephone Encounter (Signed)
Patient called asking for a note to be faxed to his work stating that he will be out of work for how ever long he needs to recover from surgery. Fax # [phone]

## 2017-05-13 NOTE — Telephone Encounter (Signed)
Can you advise?  And I can fax it? thanks

## 2017-05-13 NOTE — Telephone Encounter (Signed)
Fix it for 4 wks we will discuss further when he returns next week for visit. You can call him and fax note if he wants thanks.

## 2017-05-13 NOTE — Telephone Encounter (Signed)
I did not realize you already had a message on this one, can you do note and fax? thanks

## 2017-05-13 NOTE — Telephone Encounter (Signed)
See 8 AM note about no work times 4 wks thanks [employer]

## 2017-05-19 ENCOUNTER — Ambulatory Visit (INDEPENDENT_AMBULATORY_CARE_PROVIDER_SITE_OTHER): Payer: BC Managed Care – PPO | Admitting: Orthopaedic Surgery

## 2017-05-19 VITALS — BP 107/73 | HR 74

## 2017-05-19 DIAGNOSIS — S91311D Laceration without foreign body, right foot, subsequent encounter: Secondary | ICD-10-CM

## 2017-05-19 NOTE — Progress Notes (Signed)
   Post-Op Visit Note   Patient: Tom White           Date of Birth: 1960-10-30           MRN: [id number] Visit Date: 05/19/2017 PCP: Chesley Noon, MD   Assessment & Plan: Follow up the chain saw injury right foot with repair posterior tibial tendon. He can begin 50% weightbearing sutures are harvested today Steri-Strips applied. Recheck 3 weeks.  Chief Complaint:  Chief Complaint  Patient presents with  . Right Foot - Routine Post Op   Visit Diagnoses: Foot laceration secondary to chainsaw  Plan: Return in 3 weeks  Follow-Up Instructions: No Follow-up on file.   Orders:  No orders of the defined types were placed in this encounter.  No orders of the defined types were placed in this encounter.   Imaging: No results found.  PMFS History: Patient Active Problem List   Diagnosis Date Noted  . Foot laceration 05/03/2017  . 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 Inflammatory heel pain, right 01/21/2017   Past Medical History:  Diagnosis Date  . Anxiety   . Arthritis    "had it in my left ankle" (05/04/2017)  . GERD (gastroesophageal reflux disease)   . Pneumonia 2017    No family history on file.  Past Surgical History:  Procedure Laterality Date  . ANKLE ARTHROSCOPY Left ~ 2013   "scraped out arthritis"  . ANKLE FRACTURE SURGERY Left 12/1980  . APPLICATION OF WOUND VAC Right 05/03/2017   foot  . APPLICATION OF WOUND VAC Right 05/03/2017   Procedure: APPLICATION OF WOUND VAC;  Surgeon: Marybelle Killings, MD;  Location: [facility];  Service: Orthopedics;  Laterality: Right;  . FRACTURE SURGERY    . I&D EXTREMITY Right 05/03/2017   foot  . I&D EXTREMITY Right 05/03/2017   Procedure: IRRIGATION AND DEBRIDEMENT EXTREMITY RIGHT, REPAIR OF POSTERIOR TIBIAL TENDON;  Surgeon: Marybelle Killings, MD;  Location: [facility];  Service: Orthopedics;  Laterality: Right;  . [place]?  Marland Kitchen POSTERIOR TIBIAL TENDON REPAIR Right 05/03/2017  . SUBTALAR JOINT  ARTHROEREISIS Left 1985?  . TONSILLECTOMY     Social History   Occupational History  . Not on file.   Social History Main Topics  . Smoking status: Former Smoker    Packs/day: 1.00    Years: 15.00    Types: Cigarettes    Quit date: 1995  . Smokeless tobacco: Never Used  . Alcohol use Yes     Comment: 05/04/2017 "recovering alcoholic since ~ 3790"  . Drug use: No  . Sexual activity: Not on file

## 2017-05-25 ENCOUNTER — Ambulatory Visit (INDEPENDENT_AMBULATORY_CARE_PROVIDER_SITE_OTHER): Payer: BC Managed Care – PPO | Admitting: Orthopaedic Surgery

## 2017-05-25 ENCOUNTER — Telehealth (INDEPENDENT_AMBULATORY_CARE_PROVIDER_SITE_OTHER): Payer: Self-pay

## 2017-05-25 NOTE — Telephone Encounter (Signed)
Patient would like to know if he can wear sneakers now.  He is currently in a boot.  Cb# is [phone].  Please advise.  Thank You.

## 2017-05-25 NOTE — Telephone Encounter (Signed)
Please advise 

## 2017-05-25 NOTE — Telephone Encounter (Signed)
[facility]. Yes OK

## 2017-05-26 NOTE — Telephone Encounter (Signed)
I called patient and advised. 

## 2017-06-09 ENCOUNTER — Ambulatory Visit (INDEPENDENT_AMBULATORY_CARE_PROVIDER_SITE_OTHER): Payer: BC Managed Care – PPO | Admitting: Orthopaedic Surgery

## 2017-06-09 ENCOUNTER — Encounter (INDEPENDENT_AMBULATORY_CARE_PROVIDER_SITE_OTHER): Payer: Self-pay | Admitting: Orthopaedic Surgery

## 2017-06-09 VITALS — BP 114/78 | HR 72

## 2017-06-09 DIAGNOSIS — S91311D Laceration without foreign body, right foot, subsequent encounter: Secondary | ICD-10-CM

## 2017-06-09 NOTE — Progress Notes (Signed)
   Post-Op Visit Note   Patient: Tom White           Date of Birth: 10-26-1960           MRN: [id number] Visit Date: 06/09/2017 PCP: Chesley Noon, MD   Assessment & Plan: Chainsaw laceration medial right foot with repair posterior tibial tendon. He's wearing a tennis shoe using 2 canes. He'll gradually wean himself 1 cane. Temporal handicap sticker given 4 months.  Chief Complaint:  Chief Complaint  Patient presents with  . Right Foot - Routine Post Op   Visit Diagnoses:  1. Laceration of right foot, subsequent encounter     Plan: Recheck 8 weeks. When he gets 3 months he can resume workout activity.  Follow-Up Instructions: Return in about 8 weeks (around 08/04/2017).   Orders:  No orders of the defined types were placed in this encounter.  No orders of the defined types were placed in this encounter.   Imaging: No results found.  PMFS History: Patient Active Problem List   Diagnosis Date Noted  . Foot laceration 05/03/2017  . 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 Inflammatory heel pain, right 01/21/2017   Past Medical History:  Diagnosis Date  . Anxiety   . Arthritis    "had it in my left ankle" (05/04/2017)  . GERD (gastroesophageal reflux disease)   . Pneumonia 2017    No family history on file.  Past Surgical History:  Procedure Laterality Date  . ANKLE ARTHROSCOPY Left ~ 2013   "scraped out arthritis"  . ANKLE FRACTURE SURGERY Left 12/1980  . APPLICATION OF WOUND VAC Right 05/03/2017   foot  . APPLICATION OF WOUND VAC Right 05/03/2017   Procedure: APPLICATION OF WOUND VAC;  Surgeon: Marybelle Killings, MD;  Location: [facility];  Service: Orthopedics;  Laterality: Right;  . FRACTURE SURGERY    . I&D EXTREMITY Right 05/03/2017   foot  . I&D EXTREMITY Right 05/03/2017   Procedure: IRRIGATION AND DEBRIDEMENT EXTREMITY RIGHT, REPAIR OF POSTERIOR TIBIAL TENDON;  Surgeon: Marybelle Killings, MD;  Location: [facility];  Service: Orthopedics;  Laterality: Right;    . [place]?  Marland Kitchen POSTERIOR TIBIAL TENDON REPAIR Right 05/03/2017  . SUBTALAR JOINT ARTHROEREISIS Left 1985?  . TONSILLECTOMY     Social History   Occupational History  . Not on file.   Social History Main Topics  . Smoking status: Former Smoker    Packs/day: 1.00    Years: 15.00    Types: Cigarettes    Quit date: 1995  . Smokeless tobacco: Never Used  . Alcohol use Yes     Comment: 05/04/2017 "recovering alcoholic since ~ 1025"  . Drug use: No  . Sexual activity: Not on file

## 2017-06-17 ENCOUNTER — Telehealth: Payer: Self-pay | Admitting: *Deleted

## 2017-06-17 ENCOUNTER — Ambulatory Visit (INDEPENDENT_AMBULATORY_CARE_PROVIDER_SITE_OTHER): Payer: BC Managed Care – PPO | Admitting: Orthopedic Surgery

## 2017-06-17 ENCOUNTER — Encounter (INDEPENDENT_AMBULATORY_CARE_PROVIDER_SITE_OTHER): Payer: Self-pay | Admitting: Orthopedic Surgery

## 2017-06-17 ENCOUNTER — Ambulatory Visit (INDEPENDENT_AMBULATORY_CARE_PROVIDER_SITE_OTHER): Payer: BC Managed Care – PPO

## 2017-06-17 DIAGNOSIS — M25572 Pain in left ankle and joints of left foot: Secondary | ICD-10-CM | POA: Diagnosis not present

## 2017-06-17 DIAGNOSIS — M24672 Ankylosis, left ankle: Secondary | ICD-10-CM

## 2017-06-17 HISTORY — DX: Pain in left ankle and joints of left foot: M25.572

## 2017-06-17 NOTE — Progress Notes (Signed)
Office Visit Note   Patient: Tom White           Date of Birth: [birth year]           MRN: [id number] Visit Date: 06/17/2017              Requested by: Chesley Noon, MD [address] [place], [place] [address] PCP: Chesley Noon, MD  Chief Complaint  Patient presents with  . 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 Patient states that he's had pain in the joint for 20 years. He is also status post arthroscopic debridement for impingement of the left ankle approximately 5 years ago. Patient is most recently status post a surgical intervention on the right foot with Dr. Lorin Mercy for chainsaw injury over the posterior tibial tendon.  Patient complains of pain primarily in the sinus Tarsi region. Patient states that sometimes he has to crawl in his home due to the subtalar joint pain.  Assessment & Plan: Visit Diagnoses:  1. Pain in left ankle and joints of left foot   2. Fibrosis of subtalar joint, left     Plan: Discussed with the patient his best option to improve his symptoms proceed with a posterior arthroscopic 7 would plan for surgery in the prone position and stabilization with 2 headless 6.5 cannulated Synthes screws. Risks and benefits were discussed including potential for persistent pain. Discussed the importance of strict nonweightbearing for 2 months postoperatively. Discussed that any micromotion with not allow for proper bone healing.  Follow-Up Instructions: Return if symptoms worsen or fail to improve.   Ortho Exam  Patient is alert, oriented, no adenopathy, well-dressed, normal affect, normal respiratory effort. Examination patient has a good dorsalis pedis pulse he has good range of motion of the ankle there is no tenderness to palpation anteriorly over the ankle. Patient has essentially no subtalar motion and palpation of the sinus Tarsi  reproduces his pain. There is no redness no cellulitis no signs of infection.  Imaging: Xr Ankle Complete Left  Result Date: 06/17/2017 Three-view radiographs of the left ankle shows a fibrous union of the subtalar fusion and shows mild osteophytic bone spurs in the ankle joint anteriorly with a congruent mortise.  Xr Foot Complete Left  Result Date: 06/17/2017 Three-view radiographs of the left foot shows a fibrous union of the subtalar fusion there is a staple in place laterally.  No images are attached to the encounter.  Labs: No results found for: HGBA1C, ESRSEDRATE, CRP, LABURIC, REPTSTATUS, GRAMSTAIN, CULT, LABORGA  Orders:  Orders Placed This Encounter  Procedures  . XR Ankle Complete Left  . XR Foot Complete Left   No orders of the defined types were placed in this encounter.    Procedures: No procedures performed  Clinical Data: No additional findings.  ROS:  All other systems negative, except as noted in the HPI. Review of Systems  Objective: Vital Signs: There were no vitals taken for this visit.  Specialty Comments:  No specialty comments available.  PMFS History: Patient Active Problem List   Diagnosis Date Noted  . Pain in left ankle and joints of left foot 06/17/2017  . Foot laceration 05/03/2017  . 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 Inflammatory heel pain, right 01/21/2017   Past Medical History:  Diagnosis Date  . Anxiety   . Arthritis    "  had it in my left ankle" (05/04/2017)  . GERD (gastroesophageal reflux disease)   . Pneumonia 2017    History reviewed. No pertinent family history.  Past Surgical History:  Procedure Laterality Date  . ANKLE ARTHROSCOPY Left ~ 2013   "scraped out arthritis"  . ANKLE FRACTURE SURGERY Left 12/1980  . APPLICATION OF WOUND VAC Right 05/03/2017   foot  . APPLICATION OF WOUND VAC Right 05/03/2017   Procedure: APPLICATION OF WOUND VAC;  Surgeon: Marybelle Killings, MD;  Location: [facility];  Service:  Orthopedics;  Laterality: Right;  . FRACTURE SURGERY    . I&D EXTREMITY Right 05/03/2017   foot  . I&D EXTREMITY Right 05/03/2017   Procedure: IRRIGATION AND DEBRIDEMENT EXTREMITY RIGHT, REPAIR OF POSTERIOR TIBIAL TENDON;  Surgeon: Marybelle Killings, MD;  Location: [facility];  Service: Orthopedics;  Laterality: Right;  . [place]?  Marland Kitchen POSTERIOR TIBIAL TENDON REPAIR Right 05/03/2017  . SUBTALAR JOINT ARTHROEREISIS Left 1985?  . TONSILLECTOMY     Social History   Occupational History  . Not on file.   Social History Main Topics  . Smoking status: Former Smoker    Packs/day: 1.00    Years: 15.00    Types: Cigarettes    Quit date: 1995  . Smokeless tobacco: Never Used  . Alcohol use Yes     Comment: 05/04/2017 "recovering alcoholic since ~ 2423"  . Drug use: No  . Sexual activity: Not on file

## 2017-06-17 NOTE — Telephone Encounter (Signed)
Received request for Medical records from  Disability Determination Services, forwarded to [place] for email/scan/SLS 09/13    

## 2017-06-25 ENCOUNTER — Telehealth (INDEPENDENT_AMBULATORY_CARE_PROVIDER_SITE_OTHER): Payer: Self-pay | Admitting: Orthopedic Surgery

## 2017-06-25 NOTE — Telephone Encounter (Signed)
Patient would like to know how long he has to keep his foot raised; his bedroom is upstairs and he is wanting to know how he will be able to navigate his home, will he have to stay upstairs or downstairs, and he wants to know if he will be weight bearing in the boot after surgery.  How long will he be laid up and how soon will he be able to get up and do things.  Please call patient.  CB#[phone].  Thank you.

## 2017-06-28 NOTE — Telephone Encounter (Signed)
Called patient. Patient would be off his foot for 2 weeks with elevation for 2 weeks will need to stay down stairs.

## 2017-06-28 NOTE — Telephone Encounter (Signed)
I called advised per Dr. Sharol Given he would be nonweightbearing for 2 months per Dr. Sharol Given, 2 weeks was typo, recommended him staying down stairs. He would like to know if he can get a home health aide squared away before his surgery. Per Dr. Sharol Given home health aide will most likely not be covered. He could go to SNF.

## 2017-07-01 NOTE — Telephone Encounter (Signed)
Seeing if he will qualify for [facility]. Submitted assessment questions pending a response.

## 2017-07-05 ENCOUNTER — Other Ambulatory Visit (INDEPENDENT_AMBULATORY_CARE_PROVIDER_SITE_OTHER): Payer: Self-pay | Admitting: Family

## 2017-08-10 ENCOUNTER — Ambulatory Visit (INDEPENDENT_AMBULATORY_CARE_PROVIDER_SITE_OTHER): Payer: BC Managed Care – PPO | Admitting: Orthopaedic Surgery

## 2017-08-10 ENCOUNTER — Encounter (INDEPENDENT_AMBULATORY_CARE_PROVIDER_SITE_OTHER): Payer: Self-pay | Admitting: Orthopaedic Surgery

## 2017-08-10 ENCOUNTER — Other Ambulatory Visit (INDEPENDENT_AMBULATORY_CARE_PROVIDER_SITE_OTHER): Payer: Self-pay | Admitting: Family

## 2017-08-10 VITALS — BP 114/73 | HR 74 | Ht 69.0 in | Wt 189.0 lb

## 2017-08-10 DIAGNOSIS — S91311D Laceration without foreign body, right foot, subsequent encounter: Secondary | ICD-10-CM | POA: Diagnosis not present

## 2017-08-10 NOTE — Progress Notes (Signed)
Office Visit Note   Patient: Tom White           Date of Birth: 03-Aug-1961           MRN: [id number] Visit Date: 08/10/2017              Requested by: Chesley Noon, MD [place], [place] [address] PCP: Chesley Noon, MD   Assessment & Plan: Visit Diagnoses: Laceration right foot post posterior tibial tendon repair from chainsaw laceration on 05/03/2017.  Plan: Patient is walking without a cane.  He is getting ready to have some surgery on his opposite left foot for a fibrous subtalar nonunion.  His right foot is progressively improving each month.  Gradually increase his activity for gradual strengthening and try not to overdo it.  Follow-up with me will be on an as-needed basis.  Follow-Up Instructions: No Follow-up on file.   Orders:  No orders of the defined types were placed in this encounter.  No orders of the defined types were placed in this encounter.     Procedures: No procedures performed   Clinical Data: No additional findings.   Subjective: Chief Complaint  Patient presents with  . Right Foot - Follow-up    HPI patient returns now over 3 months post right foot chainsaw laceration repair with repair posterior tibial tendon.  Tibial function is strong he is ambulatory without a cane.  Occasionally at the end of the day he has some soreness he occasionally uses some Aleve.  Review of Systems review of systems is unchanged since his surgery in July other than as mentioned in HPI   Objective: Vital Signs: BP 114/73   Pulse 74   Ht 5\' 9"  (1.753 m)   Wt 189 lb (85.7 kg)   BMI 27.91 kg/m   Physical Exam  Constitutional: He is oriented to person, place, and time. He appears well-developed and well-nourished.  HENT:  Head: Normocephalic and atraumatic.  Eyes: EOM are normal. Pupils are equal, round, and reactive to light.  Neck: No tracheal deviation present. No thyromegaly present.  Cardiovascular: Normal rate.  Pulmonary/Chest:  Effort normal. He has no wheezes.  Abdominal: Soft. Bowel sounds are normal.  Neurological: He is alert and oriented to person, place, and time.  Skin: Skin is warm and dry. Capillary refill takes less than 2 seconds.  Psychiatric: He has a normal mood and affect. His behavior is normal. Judgment and thought content normal.    Ortho Exam patient is well-healed right medial foot laceration.  Posterior tibial tendon takes good strength.  No swelling no cellulitis normal ankle normal subtalar motion.  Specialty Comments:  No specialty comments available.  Imaging: No results found.   PMFS History: Patient Active Problem List   Diagnosis Date Noted  . Pain in left ankle and joints of left foot 06/17/2017  . Foot laceration 05/03/2017  . 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 Inflammatory heel pain, right 01/21/2017   Past Medical History:  Diagnosis Date  . Anxiety   . Arthritis    "had it in my left ankle" (05/04/2017)  . GERD (gastroesophageal reflux disease)   . Pneumonia 2017    No family history on file.  Past Surgical History:  Procedure Laterality Date  . ANKLE ARTHROSCOPY Left ~ 2013   "scraped out arthritis"  . ANKLE FRACTURE SURGERY Left 12/1980  . APPLICATION OF WOUND VAC Right 05/03/2017   foot  . FRACTURE SURGERY    .  I&D EXTREMITY Right 05/03/2017   foot  . [place]?  Marland Kitchen POSTERIOR TIBIAL TENDON REPAIR Right 05/03/2017  . SUBTALAR JOINT ARTHROEREISIS Left 1985?  . TONSILLECTOMY     Social History   Occupational History  . Not on file  Tobacco Use  . Smoking status: Former Smoker    Packs/day: 1.00    Years: 15.00    Pack years: 15.00    Types: Cigarettes    Last attempt to quit: 1995    Years since quitting: 23.8  . Smokeless tobacco: Never Used  Substance and Sexual Activity  . Alcohol use: Yes    Comment: 05/04/2017 "recovering alcoholic since ~ 8325"  . Drug use: No  . Sexual activity: Not on file

## 2017-08-31 ENCOUNTER — Encounter (INDEPENDENT_AMBULATORY_CARE_PROVIDER_SITE_OTHER): Payer: Self-pay | Admitting: Orthopedic Surgery

## 2017-08-31 ENCOUNTER — Ambulatory Visit (INDEPENDENT_AMBULATORY_CARE_PROVIDER_SITE_OTHER): Payer: BC Managed Care – PPO | Admitting: Orthopedic Surgery

## 2017-08-31 VITALS — Ht 69.0 in | Wt 189.0 lb

## 2017-08-31 DIAGNOSIS — M24672 Ankylosis, left ankle: Secondary | ICD-10-CM | POA: Diagnosis not present

## 2017-08-31 NOTE — Progress Notes (Signed)
Office Visit Note   Patient: Tom White           Date of Birth: 1960-11-18           MRN: [id number] Visit Date: 08/31/2017              Requested by: Chesley Noon, MD [address] [place], [place] [address] PCP: Chesley Noon, MD  Chief Complaint  Patient presents with  . Left Ankle - Follow-up    sch for a left ankle posterior arthroscopic subtalar arthrodesis next month      HPI: Patient is a 56 year old gentleman with fibrous nonunion subtalar fusion left foot.  Patient is scheduled for posterior arthroscopic subtalar arthrodesis on 09/22/2017.  Patient states that previously with his surgery he was noncompliant he did smoke he states he currently does not smoke.  Assessment & Plan: Visit Diagnoses:  1. Fibrosis of subtalar joint, left     Plan: We will plan for the posterior arthroscopic subtalar arthrodesis.  We will follow-up in 1 week to change the dressing follow-up in 2 weeks to remove the sutures.  Discussed that the 2 weeks we can either proceed with a fracture boot or with a short leg cast.  Discussed that he will need to be strict nonweightbearing for 6 weeks he will be in the fracture boot for the first 2 weeks with nonweightbearing and elevation.  Discussed risks of surgery including infection persistent pain DVT pulmonary embolus need for additional surgery.  Follow-Up Instructions: Return in about 1 week (around 09/07/2017).   Ortho Exam  Patient is alert, oriented, no adenopathy, well-dressed, normal affect, normal respiratory effort. Examination patient has pain with attempted inversion and eversion of the subtalar joint on the left he has no pain with range of motion of the ankle.  Radiographs are reviewed which shows a fibrous nonunion of the subtalar joint with a staple in the sinus Tarsi.  Imaging: No results found. No images are attached to the encounter.  Labs: No results found for: HGBA1C, ESRSEDRATE, CRP, LABURIC, REPTSTATUS,  GRAMSTAIN, CULT, LABORGA  @LABSALLVALUES (HGBA1)@  @BMI1 @  Orders:  No orders of the defined types were placed in this encounter.  No orders of the defined types were placed in this encounter.    Procedures: No procedures performed  Clinical Data: No additional findings.  ROS:  All other systems negative, except as noted in the HPI. Review of Systems  Objective: Vital Signs: Ht 5\' 9"  (1.753 m)   Wt 189 lb (85.7 kg)   BMI 27.91 kg/m   Specialty Comments:  No specialty comments available.  PMFS History: Patient Active Problem List   Diagnosis Date Noted  . Pain in left ankle and joints of left foot 06/17/2017  . Foot laceration 05/03/2017  . 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 Inflammatory heel pain, right 01/21/2017   Past Medical History:  Diagnosis Date  . Anxiety   . Arthritis    "had it in my left ankle" (05/04/2017)  . GERD (gastroesophageal reflux disease)   . Pneumonia 2017    History reviewed. No pertinent family history.  Past Surgical History:  Procedure Laterality Date  . ANKLE ARTHROSCOPY Left ~ 2013   "scraped out arthritis"  . ANKLE FRACTURE SURGERY Left 12/1980  . APPLICATION OF WOUND VAC Right 05/03/2017   foot  . APPLICATION OF WOUND VAC Right 05/03/2017   Procedure: APPLICATION OF WOUND VAC;  Surgeon: Marybelle Killings, MD;  Location: [facility];  Service: Orthopedics;  Laterality: Right;  . FRACTURE SURGERY    . I&D EXTREMITY Right 05/03/2017   foot  . I&D EXTREMITY Right 05/03/2017   Procedure: IRRIGATION AND DEBRIDEMENT EXTREMITY RIGHT, REPAIR OF POSTERIOR TIBIAL TENDON;  Surgeon: Marybelle Killings, MD;  Location: [facility];  Service: Orthopedics;  Laterality: Right;  . [place]?  Marland Kitchen POSTERIOR TIBIAL TENDON REPAIR Right 05/03/2017  . SUBTALAR JOINT ARTHROEREISIS Left 1985?  . TONSILLECTOMY     Social History   Occupational History  . Not on file  Tobacco Use  . Smoking status: Former Smoker    Packs/day: 1.00     Years: 15.00    Pack years: 15.00    Types: Cigarettes    Last attempt to quit: 1995    Years since quitting: 23.9  . Smokeless tobacco: Never Used  Substance and Sexual Activity  . Alcohol use: Yes    Comment: 05/04/2017 "recovering alcoholic since ~ 9675"  . Drug use: No  . Sexual activity: Not on file

## 2017-09-17 NOTE — Pre-Procedure Instructions (Signed)
Tom White  09/17/2017      [facility] [address] [place] [address] Phone: [phone] Fax: [phone]    Your procedure is scheduled on Wednesday December 19.  Report to [facility] Admitting at 6:30 A.M.  Call this number if you have problems the morning of surgery:  [phone]   Remember:  Do not eat food or drink liquids after midnight.  Take these medicines the morning of surgery with A SIP OF WATER:   Acetaminophen (tylenol) if needed Bupropion (wellbutrin) flonase  7 days prior to surgery STOP taking any Aspirin(unless otherwise instructed by your surgeon), Aleve, Naproxen, Ibuprofen, Motrin, Advil, Goody's, BC's, all herbal medications, fish oil, and all vitamins     Do not wear jewelry.  Do not wear lotions, powders, or colognes, or deodorant.  Do not shave 48 hours prior to surgery.  Men may shave face and neck.  Do not bring valuables to the hospital.  [facility] is not responsible for any belongings or valuables.  Contacts, dentures or bridgework may not be worn into surgery.  Leave your suitcase in the car.  After surgery it may be brought to your room.  For patients admitted to the hospital, discharge time will be determined by your treatment team.  Patients discharged the day of surgery will not be allowed to drive home.   Special instructions:    [facility]- Preparing For Surgery  Before surgery, you can play an important role. Because skin is not sterile, your skin needs to be as free of germs as possible. You can reduce the number of germs on your skin by washing with CHG (chlorahexidine gluconate) Soap before surgery.  CHG is an antiseptic cleaner which kills germs and bonds with the skin to continue killing germs even after washing.  Please do not use if you have an allergy to CHG or antibacterial soaps. If your skin becomes reddened/irritated stop  using the CHG.  Do not shave (including legs and underarms) for at least 48 hours prior to first CHG shower. It is OK to shave your face.  Please follow these instructions carefully.   1. Shower the NIGHT BEFORE SURGERY and the MORNING OF SURGERY with CHG.   2. If you chose to wash your hair, wash your hair first as usual with your normal shampoo.  3. After you shampoo, rinse your hair and body thoroughly to remove the shampoo.  4. Use CHG as you would any other liquid soap. You can apply CHG directly to the skin and wash gently with a scrungie or a clean washcloth.   5. Apply the CHG Soap to your body ONLY FROM THE NECK DOWN.  Do not use on open wounds or open sores. Avoid contact with your eyes, ears, mouth and genitals (private parts). Wash Face and genitals (private parts)  with your normal soap.  6. Wash thoroughly, paying special attention to the area where your surgery will be performed.  7. Thoroughly rinse your body with warm water from the neck down.  8. DO NOT shower/wash with your normal soap after using and rinsing off the CHG Soap.  9. Pat yourself dry with a CLEAN TOWEL.  10. Wear CLEAN PAJAMAS to bed the night before surgery, wear comfortable clothes the morning of surgery  11. Place CLEAN SHEETS on your bed the night of your first shower and DO NOT SLEEP WITH PETS.  Day of Surgery: Do not apply any deodorants/lotions. Please wear clean clothes to the hospital/surgery center.      Please read over the following fact sheets that you were given. Coughing and Deep Breathing, MRSA Information and Surgical Site Infection Prevention

## 2017-09-20 ENCOUNTER — Encounter (HOSPITAL_COMMUNITY)
Admission: RE | Admit: 2017-09-20 | Discharge: 2017-09-20 | Disposition: A | Discharge status: Home / Self Care | Payer: BC Managed Care – PPO | Source: Ambulatory Visit | Attending: Orthopedic Surgery | Admitting: Orthopedic Surgery

## 2017-09-20 ENCOUNTER — Encounter (HOSPITAL_COMMUNITY): Payer: Self-pay

## 2017-09-20 DIAGNOSIS — F17210 Nicotine dependence, cigarettes, uncomplicated: Secondary | ICD-10-CM | POA: Diagnosis not present

## 2017-09-20 DIAGNOSIS — Z79899 Other long term (current) drug therapy: Secondary | ICD-10-CM | POA: Diagnosis not present

## 2017-09-20 DIAGNOSIS — F419 Anxiety disorder, unspecified: Secondary | ICD-10-CM | POA: Diagnosis not present

## 2017-09-20 DIAGNOSIS — F329 Major depressive disorder, single episode, unspecified: Secondary | ICD-10-CM | POA: Diagnosis not present

## 2017-09-20 DIAGNOSIS — K219 Gastro-esophageal reflux disease without esophagitis: Secondary | ICD-10-CM | POA: Diagnosis not present

## 2017-09-20 DIAGNOSIS — M24672 Ankylosis, left ankle: Secondary | ICD-10-CM | POA: Diagnosis not present

## 2017-09-20 LAB — BASIC METABOLIC PANEL
Anion gap: 9 (ref 5–15)
BUN: 11 mg/dL (ref 6–20)
CHLORIDE: 102 mmol/L (ref 101–111)
CO2: 29 mmol/L (ref 22–32)
Calcium: 9.2 mg/dL (ref 8.9–10.3)
Creatinine, Ser: 1.1 mg/dL (ref 0.61–1.24)
GFR calc non Af Amer: >60 mL/min (ref >60)
Glucose, Bld: 97 mg/dL (ref 65–99)
POTASSIUM: 4.3 mmol/L (ref 3.5–5.1)
SODIUM: 140 mmol/L (ref 135–145)

## 2017-09-20 LAB — CBC
HEMATOCRIT: 45.7 % (ref 39.0–52.0)
HEMOGLOBIN: 15.7 g/dL (ref 13.0–17.0)
MCH: 30.2 pg (ref 26.0–34.0)
MCHC: 34.4 g/dL (ref 30.0–36.0)
MCV: 87.9 fL (ref 78.0–100.0)
Platelets: 231 10*3/uL (ref 150–400)
RBC: 5.2 MIL/uL (ref 4.22–5.81)
RDW: 12.5 % (ref 11.5–15.5)
WBC: 6.6 10*3/uL (ref 4.0–10.5)

## 2017-09-20 LAB — SURGICAL PCR SCREEN
MRSA, PCR: NEGATIVE
STAPHYLOCOCCUS AUREUS: NEGATIVE

## 2017-09-21 NOTE — Anesthesia Preprocedure Evaluation (Addendum)
Anesthesia Evaluation  Patient identified by MRN, date of birth, ID band Patient awake    Reviewed: Allergy & Precautions, NPO status , Patient's Chart, lab work & pertinent test results  History of Anesthesia Complications Negative for: history of anesthetic complications  Airway Mallampati: I  TM Distance: >3 FB Neck ROM: Full    Dental  (+) Teeth Intact, Dental Advisory Given   Pulmonary former smoker (quit 1995),    breath sounds clear to auscultation       Cardiovascular negative cardio ROS   Rhythm:Regular Rate:Normal     Neuro/Psych PSYCHIATRIC DISORDERS Anxiety Depression negative neurological ROS     GI/Hepatic Neg liver ROS, GERD  Controlled,  Endo/Other  negative endocrine ROS  Renal/GU negative Renal ROS     Musculoskeletal  (+) Arthritis ,   Abdominal   Peds  Hematology negative hematology ROS (+)   Anesthesia Other Findings   Reproductive/Obstetrics                           Anesthesia Physical Anesthesia Plan  ASA: II  Anesthesia Plan: General   Post-op Pain Management:    Induction: Intravenous  PONV Risk Score and Plan: 3 and Ondansetron, Dexamethasone, Scopolamine patch - Pre-op and Midazolam  Airway Management Planned: Oral ETT  Additional Equipment:   Intra-op Plan:   Post-operative Plan: Extubation in OR  Informed Consent: I have reviewed the patients History and Physical, chart, labs and discussed the procedure including the risks, benefits and alternatives for the proposed anesthesia with the patient or authorized representative who has indicated his/her understanding and acceptance.   Dental advisory given  Plan Discussed with: Anesthesiologist, Surgeon and CRNA  Anesthesia Plan Comments: (Plan routine monitors, GA- LMA OK Pt understands and reserves popliteal and adductor blocks for post operative use if he needs for additional analgesia )       Anesthesia Quick Evaluation

## 2017-09-22 ENCOUNTER — Encounter (HOSPITAL_COMMUNITY): Payer: Self-pay | Admitting: Urology

## 2017-09-22 ENCOUNTER — Encounter (HOSPITAL_COMMUNITY)
Admission: RE | Disposition: A | Discharge status: Home / Self Care | Payer: Self-pay | Source: Ambulatory Visit | Attending: Orthopedic Surgery

## 2017-09-22 ENCOUNTER — Ambulatory Visit (HOSPITAL_COMMUNITY)
Admission: RE | Admit: 2017-09-22 | Discharge: 2017-09-22 | Disposition: A | Discharge status: Home / Self Care | Payer: BC Managed Care – PPO | Source: Ambulatory Visit | Attending: Orthopedic Surgery | Admitting: Orthopedic Surgery

## 2017-09-22 ENCOUNTER — Ambulatory Visit (HOSPITAL_COMMUNITY): Payer: BC Managed Care – PPO | Admitting: Anesthesiology

## 2017-09-22 DIAGNOSIS — M24672 Ankylosis, left ankle: Secondary | ICD-10-CM

## 2017-09-22 DIAGNOSIS — K219 Gastro-esophageal reflux disease without esophagitis: Secondary | ICD-10-CM | POA: Insufficient documentation

## 2017-09-22 DIAGNOSIS — Z79899 Other long term (current) drug therapy: Secondary | ICD-10-CM | POA: Insufficient documentation

## 2017-09-22 DIAGNOSIS — F17210 Nicotine dependence, cigarettes, uncomplicated: Secondary | ICD-10-CM | POA: Insufficient documentation

## 2017-09-22 DIAGNOSIS — F329 Major depressive disorder, single episode, unspecified: Secondary | ICD-10-CM | POA: Insufficient documentation

## 2017-09-22 DIAGNOSIS — F419 Anxiety disorder, unspecified: Secondary | ICD-10-CM | POA: Insufficient documentation

## 2017-09-22 HISTORY — PX: ANKLE ARTHROSCOPY WITH FUSION: SHX5581

## 2017-09-22 SURGERY — ANKLE ARTHROSCOPY WITH FUSION
Anesthesia: General | Laterality: Left

## 2017-09-22 MED ORDER — FENTANYL CITRATE (PF) 100 MCG/2ML IJ SOLN
INTRAMUSCULAR | Status: DC | PRN
  Administered 2017-09-22: 150 ug via INTRAVENOUS

## 2017-09-22 MED ORDER — CHLORHEXIDINE GLUCONATE 4 % EX LIQD: 60.0000 mL | Freq: Once | CUTANEOUS | Status: DC

## 2017-09-22 MED ORDER — SCOPOLAMINE 1 MG/3DAYS TD PT72
MEDICATED_PATCH | TRANSDERMAL | Status: DC | PRN
  Administered 2017-09-22: 1 via TRANSDERMAL

## 2017-09-22 MED ORDER — ROCURONIUM BROMIDE 100 MG/10ML IV SOLN
INTRAVENOUS | Status: DC | PRN
  Administered 2017-09-22: 50 mg via INTRAVENOUS

## 2017-09-22 MED ORDER — FENTANYL CITRATE (PF) 250 MCG/5ML IJ SOLN
INTRAMUSCULAR | Status: AC
  Filled 2017-09-22: qty 5

## 2017-09-22 MED ORDER — MIDAZOLAM HCL 5 MG/5ML IJ SOLN
INTRAMUSCULAR | Status: DC | PRN
  Administered 2017-09-22: 2 mg via INTRAVENOUS

## 2017-09-22 MED ORDER — OXYCODONE-ACETAMINOPHEN 5-325 MG PO TABS: 1.0000 | ORAL_TABLET | ORAL | 0 refills | Status: AC | PRN

## 2017-09-22 MED ORDER — LIDOCAINE 2% (20 MG/ML) 5 ML SYRINGE
INTRAMUSCULAR | Status: AC
  Filled 2017-09-22: qty 5

## 2017-09-22 MED ORDER — PROMETHAZINE HCL 25 MG/ML IJ SOLN: 6.2500 mg | INTRAMUSCULAR | Status: DC | PRN

## 2017-09-22 MED ORDER — SCOPOLAMINE 1 MG/3DAYS TD PT72
MEDICATED_PATCH | TRANSDERMAL | Status: AC
  Filled 2017-09-22: qty 1

## 2017-09-22 MED ORDER — HYDROMORPHONE HCL 1 MG/ML IJ SOLN: 0.2500 mg | INTRAMUSCULAR | Status: DC | PRN

## 2017-09-22 MED ORDER — CLINDAMYCIN PHOSPHATE 900 MG/50ML IV SOLN
INTRAVENOUS | Status: AC
  Filled 2017-09-22: qty 50

## 2017-09-22 MED ORDER — ROCURONIUM BROMIDE 10 MG/ML (PF) SYRINGE
PREFILLED_SYRINGE | INTRAVENOUS | Status: AC
  Filled 2017-09-22: qty 5

## 2017-09-22 MED ORDER — STERILE WATER FOR IRRIGATION IR SOLN
Status: DC | PRN
Start: 2017-09-22 — End: 2017-09-22
  Administered 2017-09-22: 1000 mL

## 2017-09-22 MED ORDER — ONDANSETRON HCL 4 MG/2ML IJ SOLN
INTRAMUSCULAR | Status: DC | PRN
Start: 2017-09-22 — End: 2017-09-22
  Administered 2017-09-22: 4 mg via INTRAVENOUS

## 2017-09-22 MED ORDER — SODIUM CHLORIDE 0.9 % IR SOLN
Status: DC | PRN
  Administered 2017-09-22 (×2): 3000 mL

## 2017-09-22 MED ORDER — ONDANSETRON HCL 4 MG/2ML IJ SOLN
INTRAMUSCULAR | Status: AC
  Filled 2017-09-22: qty 2

## 2017-09-22 MED ORDER — OXYCODONE-ACETAMINOPHEN 5-325 MG PO TABS
ORAL_TABLET | ORAL | Status: AC
  Filled 2017-09-22: qty 1

## 2017-09-22 MED ORDER — SUGAMMADEX SODIUM 200 MG/2ML IV SOLN
INTRAVENOUS | Status: AC
  Filled 2017-09-22: qty 2

## 2017-09-22 MED ORDER — LIDOCAINE 2% (20 MG/ML) 5 ML SYRINGE
INTRAMUSCULAR | Status: DC | PRN
  Administered 2017-09-22: 40 mg via INTRAVENOUS

## 2017-09-22 MED ORDER — PROPOFOL 10 MG/ML IV BOLUS
INTRAVENOUS | Status: AC
  Filled 2017-09-22: qty 20

## 2017-09-22 MED ORDER — DEXAMETHASONE SODIUM PHOSPHATE 10 MG/ML IJ SOLN
INTRAMUSCULAR | Status: DC | PRN
  Administered 2017-09-22: 10 mg via INTRAVENOUS

## 2017-09-22 MED ORDER — PHENYLEPHRINE HCL 10 MG/ML IJ SOLN
INTRAMUSCULAR | Status: DC | PRN
  Administered 2017-09-22: 40 ug via INTRAVENOUS

## 2017-09-22 MED ORDER — MIDAZOLAM HCL 2 MG/2ML IJ SOLN: 0.5000 mg | Freq: Once | INTRAMUSCULAR | Status: DC | PRN

## 2017-09-22 MED ORDER — 0.9 % SODIUM CHLORIDE (POUR BTL) OPTIME
TOPICAL | Status: DC | PRN
  Administered 2017-09-22: 1000 mL

## 2017-09-22 MED ORDER — CLINDAMYCIN PHOSPHATE 900 MG/50ML IV SOLN
900.0000 mg | INTRAVENOUS | Status: AC
  Administered 2017-09-22: 900 mg via INTRAVENOUS

## 2017-09-22 MED ORDER — MEPERIDINE HCL 25 MG/ML IJ SOLN: 6.2500 mg | INTRAMUSCULAR | Status: DC | PRN

## 2017-09-22 MED ORDER — MIDAZOLAM HCL 2 MG/2ML IJ SOLN
INTRAMUSCULAR | Status: AC
  Filled 2017-09-22: qty 2

## 2017-09-22 MED ORDER — LACTATED RINGERS IV SOLN
INTRAVENOUS | Status: DC | PRN
  Administered 2017-09-22 (×2): via INTRAVENOUS

## 2017-09-22 MED ORDER — OXYCODONE-ACETAMINOPHEN 5-325 MG PO TABS
1.0000 | ORAL_TABLET | Freq: Once | ORAL | Status: AC
  Administered 2017-09-22: 1 via ORAL

## 2017-09-22 MED ORDER — SUGAMMADEX SODIUM 200 MG/2ML IV SOLN
INTRAVENOUS | Status: DC | PRN
  Administered 2017-09-22: 200 mg via INTRAVENOUS

## 2017-09-22 MED ORDER — DEXAMETHASONE SODIUM PHOSPHATE 10 MG/ML IJ SOLN
INTRAMUSCULAR | Status: AC
  Filled 2017-09-22: qty 1

## 2017-09-22 MED ORDER — PROPOFOL 10 MG/ML IV BOLUS
INTRAVENOUS | Status: DC | PRN
  Administered 2017-09-22: 150 mg via INTRAVENOUS

## 2017-09-22 SURGICAL SUPPLY — 54 items
BANDAGE ESMARK 6X9 LF (GAUZE/BANDAGES/DRESSINGS) ×1 IMPLANT
BLADE CUDA 5.5 (BLADE) IMPLANT
BLADE GREAT WHITE 4.2 (BLADE) ×2 IMPLANT
BLADE GREAT WHITE 4.2MM (BLADE) ×1
BNDG COHESIVE 6X5 TAN STRL LF (GAUZE/BANDAGES/DRESSINGS) ×3 IMPLANT
BNDG ESMARK 6X9 LF (GAUZE/BANDAGES/DRESSINGS) ×3
BUR OVAL 6.0 (BURR) IMPLANT
BUR VERTEX HOODED 4.5 (BURR) ×3 IMPLANT
COVER SURGICAL LIGHT HANDLE (MISCELLANEOUS) ×3 IMPLANT
CUFF TOURNIQUET SINGLE 34IN LL (TOURNIQUET CUFF) IMPLANT
CUFF TOURNIQUET SINGLE 44IN (TOURNIQUET CUFF) IMPLANT
DRAPE ARTHROSCOPY W/POUCH 114 (DRAPES) ×3 IMPLANT
DRAPE OEC MINIVIEW 54X84 (DRAPES) ×3 IMPLANT
DRAPE U-SHAPE 47X51 STRL (DRAPES) ×3 IMPLANT
DRSG ADAPTIC 3X8 NADH LF (GAUZE/BANDAGES/DRESSINGS) ×3 IMPLANT
DRSG EMULSION OIL 3X3 NADH (GAUZE/BANDAGES/DRESSINGS) ×3 IMPLANT
DRSG PAD ABDOMINAL 8X10 ST (GAUZE/BANDAGES/DRESSINGS) ×3 IMPLANT
DURAPREP 26ML APPLICATOR (WOUND CARE) ×3 IMPLANT
ELECT REM PT RETURN 9FT ADLT (ELECTROSURGICAL) ×3
ELECTRODE REM PT RTRN 9FT ADLT (ELECTROSURGICAL) ×1 IMPLANT
GAUZE SPONGE 4X4 12PLY STRL (GAUZE/BANDAGES/DRESSINGS) ×3 IMPLANT
GAUZE SPONGE 4X4 12PLY STRL LF (GAUZE/BANDAGES/DRESSINGS) ×3 IMPLANT
GLOVE BIOGEL PI IND STRL 9 (GLOVE) ×3 IMPLANT
GLOVE BIOGEL PI INDICATOR 9 (GLOVE) ×6
GLOVE SURG ORTHO 9.0 STRL STRW (GLOVE) ×9 IMPLANT
GOWN STRL REUS W/ TWL XL LVL3 (GOWN DISPOSABLE) ×3 IMPLANT
GOWN STRL REUS W/TWL XL LVL3 (GOWN DISPOSABLE) ×6
GUIDEWIRE 2.8MM (WIRE) ×6 IMPLANT
IV NS IRRIG 3000ML ARTHROMATIC (IV SOLUTION) ×6 IMPLANT
KIT BASIN OR (CUSTOM PROCEDURE TRAY) ×3 IMPLANT
KIT ROOM TURNOVER OR (KITS) ×3 IMPLANT
MANIFOLD NEPTUNE II (INSTRUMENTS) ×3 IMPLANT
NEEDLE 18GX1X1/2 (RX/OR ONLY) (NEEDLE) ×3 IMPLANT
NS IRRIG 1000ML POUR BTL (IV SOLUTION) ×3 IMPLANT
PACK ARTHROSCOPY DSU (CUSTOM PROCEDURE TRAY) ×3 IMPLANT
PAD ABD 8X10 STRL (GAUZE/BANDAGES/DRESSINGS) ×3 IMPLANT
PAD ARMBOARD 7.5X6 YLW CONV (MISCELLANEOUS) ×6 IMPLANT
PADDING CAST COTTON 6X4 STRL (CAST SUPPLIES) ×3 IMPLANT
PENCIL BUTTON HOLSTER BLD 10FT (ELECTRODE) ×3 IMPLANT
SCREW 6.5X60MM (Screw) ×6 IMPLANT
SET ARTHROSCOPY TUBING (MISCELLANEOUS) ×2
SET ARTHROSCOPY TUBING LN (MISCELLANEOUS) ×1 IMPLANT
SPONGE LAP 18X18 X RAY DECT (DISPOSABLE) ×3 IMPLANT
SPONGE LAP 4X18 X RAY DECT (DISPOSABLE) ×3 IMPLANT
SUCTION FRAZIER HANDLE 10FR (MISCELLANEOUS)
SUCTION TUBE FRAZIER 10FR DISP (MISCELLANEOUS) IMPLANT
SUT ETHILON 2 0 PSLX (SUTURE) ×3 IMPLANT
SYR 20CC LL (SYRINGE) ×3 IMPLANT
TAPE STRIPS DRAPE STRL (GAUZE/BANDAGES/DRESSINGS) IMPLANT
TOWEL OR 17X24 6PK STRL BLUE (TOWEL DISPOSABLE) ×3 IMPLANT
TOWEL OR 17X26 10 PK STRL BLUE (TOWEL DISPOSABLE) ×3 IMPLANT
WAND STAR VAC 90 (SURGICAL WAND) ×3 IMPLANT
WATER STERILE IRR 1000ML POUR (IV SOLUTION) ×3 IMPLANT
WRAP KNEE MAXI GEL POST OP (GAUZE/BANDAGES/DRESSINGS) ×3 IMPLANT

## 2017-09-22 NOTE — H&P (Signed)
Tom White is an 56 y.o. male.   Chief Complaint: Left foot pain HPI: Patient is a 56 year old gentleman with fibrous nonunion subtalar fusion left foot.  Patient is scheduled for posterior arthroscopic subtalar arthrodesis on 09/22/2017.  Patient states that previously with his surgery he was noncompliant he did smoke he states he currently does not smoke.    Past Medical History:  Diagnosis Date  . Anxiety   . Arthritis    "had it in my left ankle" (05/04/2017)  . Depression   . GERD (gastroesophageal reflux disease)   . Pneumonia 2017    Past Surgical History:  Procedure Laterality Date  . ANKLE ARTHROSCOPY Left ~ 2013   "scraped out arthritis"  . ANKLE FRACTURE SURGERY Left 12/1980  . APPLICATION OF WOUND VAC Right 05/03/2017   foot  . APPLICATION OF WOUND VAC Right 05/03/2017   Procedure: APPLICATION OF WOUND VAC;  Surgeon: Marybelle Killings, MD;  Location: [facility];  Service: Orthopedics;  Laterality: Right;  . FRACTURE SURGERY    . I&D EXTREMITY Right 05/03/2017   foot  . I&D EXTREMITY Right 05/03/2017   Procedure: IRRIGATION AND DEBRIDEMENT EXTREMITY RIGHT, REPAIR OF POSTERIOR TIBIAL TENDON;  Surgeon: Marybelle Killings, MD;  Location: [facility];  Service: Orthopedics;  Laterality: Right;  . [place]?  Marland Kitchen POSTERIOR TIBIAL TENDON REPAIR Right 05/03/2017  . SUBTALAR JOINT ARTHROEREISIS Left 1985?  . TONSILLECTOMY      No family history on file. Social History:  reports that he quit smoking about 23 years ago. His smoking use included cigarettes. He has a 15.00 pack-year smoking history. he has never used smokeless tobacco. He reports that he drinks alcohol. He reports that he does not use drugs.  Allergies:  Allergies  Allergen Reactions  . Ancef [Cefazolin] Hives    Medications Prior to Admission  Medication Sig Dispense Refill  . acetaminophen (TYLENOL) 500 MG tablet Take 500 mg by mouth daily as needed for headache.    Marland Kitchen buPROPion (WELLBUTRIN XL) 300 MG  24 hr tablet Take 300 mg by mouth daily.    . citalopram (CELEXA) 20 MG tablet Take 20 mg by mouth at bedtime.    . diazepam (VALIUM) 10 MG tablet Take 5-10 mg by mouth every 12 (twelve) hours as needed for anxiety or sleep (takes 1 every night at bedtime and 0.5 tablet if needed in day for anxiety).     . fluticasone (FLONASE) 50 MCG/ACT nasal spray Place 1 spray into both nostrils 2 (two) times daily.    . Lactobacillus Rhamnosus, GG, (CULTURELLE) CAPS Take 1 tablet by mouth daily.    . Menthol, Topical Analgesic, (ICY HOT) 7.5 % (Roll) MISC Apply 1 each topically daily as needed (muscle pain).    . naproxen sodium (ALEVE) 220 MG tablet Take 220 mg by mouth daily as needed (PAIN).    Marland Kitchen polyethylene glycol (MIRALAX / GLYCOLAX) packet Take 17 g by mouth daily.    Marland Kitchen HYDROcodone-acetaminophen (NORCO) 5-325 MG tablet Take 1-2 tablets by mouth every 4 (four) hours as needed for moderate pain. (Patient not taking: Reported on 09/15/2017) 30 tablet 0    Results for orders placed or performed during the hospital encounter of 09/20/17 (from the past 48 hour(s))  Surgical pcr screen     Status: None   Collection Time: 09/20/17  1:17 PM  Result Value Ref Range   MRSA, PCR NEGATIVE NEGATIVE   Staphylococcus aureus NEGATIVE NEGATIVE    Comment: (NOTE) The  Xpert SA Assay (FDA approved for NASAL specimens in patients 3 years of age and older), is one component of a comprehensive surveillance program. It is not intended to diagnose infection nor to guide or monitor treatment.   CBC     Status: None   Collection Time: 09/20/17  1:19 PM  Result Value Ref Range   WBC 6.6 4.0 - 10.5 K/uL   RBC 5.20 4.22 - 5.81 MIL/uL   Hemoglobin 15.7 13.0 - 17.0 g/dL   HCT 45.7 39.0 - 52.0 %   MCV 87.9 78.0 - 100.0 fL   MCH 30.2 26.0 - 34.0 pg   MCHC 34.4 30.0 - 36.0 g/dL   RDW 12.5 11.5 - 15.5 %   Platelets 231 150 - 400 K/uL  Basic metabolic panel     Status: None   Collection Time: 09/20/17  1:19 PM  Result  Value Ref Range   Sodium 140 135 - 145 mmol/L   Potassium 4.3 3.5 - 5.1 mmol/L   Chloride 102 101 - 111 mmol/L   CO2 29 22 - 32 mmol/L   Glucose, Bld 97 65 - 99 mg/dL   BUN 11 6 - 20 mg/dL   Creatinine, Ser 1.10 0.61 - 1.24 mg/dL   Calcium 9.2 8.9 - 10.3 mg/dL   GFR calc non Af Amer >60 >60 mL/min   GFR calc Af Amer >60 >60 mL/min    Comment: (NOTE) The eGFR has been calculated using the CKD EPI equation. This calculation has not been validated in all clinical situations. eGFR's persistently <60 mL/min signify possible Chronic Kidney Disease.    Anion gap 9 5 - 15   No results found.  Review of Systems  All other systems reviewed and are negative.   There were no vitals taken for this visit. Physical Exam  Patient is alert, oriented, no adenopathy, well-dressed, normal affect, normal respiratory effort. Examination patient has pain with attempted inversion and eversion of the subtalar joint on the left he has no pain with range of motion of the ankle.  Radiographs are reviewed which shows a fibrous nonunion of the subtalar joint with a staple in the sinus Tarsi.   Assessment/Plan 1. Fibrosis of subtalar joint, left     Plan: We will plan for the posterior arthroscopic subtalar arthrodesis.  We will follow-up in 1 week to change the dressing follow-up in 2 weeks to remove the sutures.  Discussed that the 2 weeks we can either proceed with a fracture boot or with a short leg cast.  Discussed that he will need to be strict nonweightbearing for 6 weeks he will be in the fracture boot for the first 2 weeks with nonweightbearing and elevation.  Discussed risks of surgery including infection persistent pain DVT pulmonary embolus need for additional surgery.     Newt Minion, MD 09/22/2017, 6:49 AM

## 2017-09-22 NOTE — Op Note (Signed)
09/22/2017  9:54 AM  PATIENT:  Tom White    PRE-OPERATIVE DIAGNOSIS:  Fibrosis of Subtalar Joint Left Ankle  POST-OPERATIVE DIAGNOSIS:  Same  PROCEDURE:  LEFT ANKLE POSTERIOR ARTHROSCOPIC SUBTALAR ARTHRODESIS, utilization of the mini C arm.  SURGEON:  Newt Minion, MD  PHYSICIAN ASSISTANT:None ANESTHESIA:   General  PREOPERATIVE INDICATIONS:  [name] is a  56 y.o. male with a diagnosis of Fibrosis of Subtalar Joint Left Ankle who failed conservative measures and elected for surgical management.    The risks benefits and alternatives were discussed with the patient preoperatively including but not limited to the risks of infection, bleeding, nerve injury, cardiopulmonary complications, the need for revision surgery, among others, and the patient was willing to proceed.  OPERATIVE IMPLANTS: 6.5 headless cannulated screws x2  OPERATIVE FINDINGS: Fibrous union of the subtalar joint. Total radiation 0.06 milli-gray, 5 sec OPERATIVE PROCEDURE: Patient was brought to operating room and underwent a general anesthetic.  After adequate levels of anesthesia were obtained patient was placed prone on the operating room table in the left lower extremity was prepped using DuraPrep draped into a sterile field a posterior medial and posterior lateral arthroscopic portals were used.  The vapor wand and the shaver were used to debride down to the subtalar joint region.  18-gauge needle was used to localize the joint due to the overgrown bone.  A bur was then used to debride through the overlying bone down to the area of the fibrous union this was debrided out with both the bur and a ring curette.  After debridement of the subtalar joint 2 guidewires were placed from the calcaneus into the talus to stabilize the subtalar joint.  C-arm fluoroscopy was utilized for alignment.  To 60 mm 6.5 headless cannulated screws were used to stabilize the subtalar joint.  The wound was irrigated with normal saline  incisions were closed using 2-0 nylon.  A sterile compressive dressing was applied patient was extubated taken the PACU in stable condition plan for discharge to home.   DISCHARGE PLANNING:  Antibiotic duration: Perioperative antibiotics clindamycin.  Weightbearing: Nonweightbearing left lower extremity with elevation.  Pain medication: Prescription provided for Percocet.  Dressing care/ Wound VAC: Keep the dressing in place clean and dry for 5 days.  Ambulatory devices: Crutches  Discharge to:.  Home.  Follow-up: In the office 1 week post operative.

## 2017-09-22 NOTE — Anesthesia Postprocedure Evaluation (Signed)
Anesthesia Post Note  Patient: Tom White  Procedure(s) Performed: LEFT ANKLE POSTERIOR ARTHROSCOPIC SUBTALAR ARTHRODESIS (Left )     Patient location during evaluation: PACU Anesthesia Type: General Level of consciousness: awake and alert, oriented and sedated Pain management: pain level controlled Vital Signs Assessment: post-procedure vital signs reviewed and stable Respiratory status: spontaneous breathing, nonlabored ventilation and respiratory function stable Cardiovascular status: blood pressure returned to baseline and stable Postop Assessment: no apparent nausea or vomiting Anesthetic complications: no    Last Vitals:  Vitals:   09/22/17 1048 09/22/17 1116  BP: 113/67 109/81  Pulse: 79 76  Resp: 16 18  Temp:    SpO2: 100% 100%    Last Pain:  Vitals:   09/22/17 1048  TempSrc:   PainSc: 0-No pain                 Tyjai Charbonnet,E. Dillon Livermore

## 2017-09-22 NOTE — Anesthesia Procedure Notes (Signed)
Procedure Name: Intubation Date/Time: 09/22/2017 8:50 AM Performed by: Babs Bertin, CRNA Pre-anesthesia Checklist: Patient identified, Emergency Drugs available, Suction available and Patient being monitored Patient Re-evaluated:Patient Re-evaluated prior to induction Oxygen Delivery Method: Circle System Utilized Preoxygenation: Pre-oxygenation with 100% oxygen Induction Type: IV induction Ventilation: Mask ventilation without difficulty Laryngoscope Size: Mac and 3 Grade View: Grade I Tube type: Oral Tube size: 7.5 mm Number of attempts: 1 Airway Equipment and Method: Stylet and Oral airway Placement Confirmation: ETT inserted through vocal cords under direct vision,  positive ETCO2 and breath sounds checked- equal and bilateral Secured at: 22 cm Tube secured with: Tape Dental Injury: Teeth and Oropharynx as per pre-operative assessment

## 2017-09-22 NOTE — Transfer of Care (Signed)
Immediate Anesthesia Transfer of Care Note  Patient: Tom White  Procedure(s) Performed: LEFT ANKLE POSTERIOR ARTHROSCOPIC SUBTALAR ARTHRODESIS (Left )  Patient Location: PACU  Anesthesia Type:General  Level of Consciousness: awake, alert  and oriented  Airway & Oxygen Therapy: Patient Spontanous Breathing  Post-op Assessment: Report given to RN and Post -op Vital signs reviewed and stable  Post vital signs: Reviewed and stable  Last Vitals:  Vitals:   09/22/17 0648 09/22/17 1015  BP: 106/72   Pulse: 65   Resp: 20   Temp: 36.8 C (!) 36.3 C  SpO2: 97%     Last Pain:  Vitals:   09/22/17 0702  TempSrc:   PainSc: 4          Complications: No apparent anesthesia complications

## 2017-09-23 ENCOUNTER — Encounter (HOSPITAL_COMMUNITY): Payer: Self-pay | Admitting: Orthopedic Surgery

## 2017-09-24 ENCOUNTER — Telehealth (INDEPENDENT_AMBULATORY_CARE_PROVIDER_SITE_OTHER): Payer: Self-pay | Admitting: Orthopedic Surgery

## 2017-09-24 NOTE — Telephone Encounter (Signed)
Pt needs to know the proper way to wear his vibe sock made by Dr.Duda. Pt also wanted to know should he wear is sock at night. Pt also wanted to know should he be moving is foot in a flex motion.

## 2017-09-24 NOTE — Telephone Encounter (Signed)
Tried calling. No answer. LM to [facility] to discuss.

## 2017-10-01 ENCOUNTER — Emergency Department (HOSPITAL_COMMUNITY): Payer: BC Managed Care – PPO

## 2017-10-01 ENCOUNTER — Telehealth (INDEPENDENT_AMBULATORY_CARE_PROVIDER_SITE_OTHER): Payer: Self-pay | Admitting: Orthopedic Surgery

## 2017-10-01 ENCOUNTER — Encounter (HOSPITAL_COMMUNITY): Payer: Self-pay | Admitting: Emergency Medicine

## 2017-10-01 ENCOUNTER — Other Ambulatory Visit: Payer: Self-pay

## 2017-10-01 ENCOUNTER — Emergency Department (HOSPITAL_COMMUNITY)
Admission: EM | Admit: 2017-10-01 | Discharge: 2017-10-01 | Disposition: A | Discharge status: Home / Self Care | Payer: BC Managed Care – PPO | Attending: Emergency Medicine | Admitting: Emergency Medicine

## 2017-10-01 ENCOUNTER — Telehealth (INDEPENDENT_AMBULATORY_CARE_PROVIDER_SITE_OTHER): Payer: Self-pay | Admitting: Radiology

## 2017-10-01 DIAGNOSIS — M79672 Pain in left foot: Secondary | ICD-10-CM | POA: Insufficient documentation

## 2017-10-01 DIAGNOSIS — Y939 Activity, unspecified: Secondary | ICD-10-CM | POA: Diagnosis not present

## 2017-10-01 DIAGNOSIS — Y929 Unspecified place or not applicable: Secondary | ICD-10-CM | POA: Insufficient documentation

## 2017-10-01 DIAGNOSIS — Y999 Unspecified external cause status: Secondary | ICD-10-CM | POA: Insufficient documentation

## 2017-10-01 DIAGNOSIS — Z87891 Personal history of nicotine dependence: Secondary | ICD-10-CM | POA: Diagnosis not present

## 2017-10-01 DIAGNOSIS — W182XXA Fall in (into) shower or empty bathtub, initial encounter: Secondary | ICD-10-CM | POA: Insufficient documentation

## 2017-10-01 DIAGNOSIS — W19XXXA Unspecified fall, initial encounter: Secondary | ICD-10-CM

## 2017-10-01 DIAGNOSIS — Z79899 Other long term (current) drug therapy: Secondary | ICD-10-CM | POA: Insufficient documentation

## 2017-10-01 MED ORDER — IBUPROFEN 800 MG PO TABS
800.0000 mg | ORAL_TABLET | Freq: Once | ORAL | Status: AC
  Administered 2017-10-01: 800 mg via ORAL
  Filled 2017-10-01: qty 1

## 2017-10-01 NOTE — Discharge Instructions (Signed)
You were seen in the emergency department following a fall. X-rays of your right foot and left elbow did not show any fractures or dislocations. The x-ray of your left foot showed that all hardware is intact. I have attached the images and the report from your left foot x-ray to your discharge instructions.   Follow up with Dr. Sharol Given as scheduled on Monday. Return to the emergency department for any new or worsening symptoms, including blue discoloration to your feet or hands, numbness, weakness, increase in pain or any other concerns.   Remain non weightbearing as instructed by Dr. Sharol Given. I have attached his plan below from your initial history and physical for surgery:   We will plan for the posterior arthroscopic subtalar arthrodesis.  We will follow-up in 1 week to change the dressing follow-up in 2 weeks to remove the sutures.  Discussed that the 2 weeks we can either proceed with a fracture boot or with a short leg cast.  Discussed that he will need to be strict nonweightbearing for 6 weeks he will be in the fracture boot for the first 2 weeks with nonweightbearing and elevation.

## 2017-10-01 NOTE — Telephone Encounter (Signed)
I called patient to r/s appt for 10/06/17. He was actually in the ED at the time I called him due to a fall which busted open his incision, he wanted to make Dr. Sharol Given aware of this. I advised him to call back to reschedule after they treat him.

## 2017-10-01 NOTE — ED Triage Notes (Signed)
Pt. Stated, I was trying to get of of bath and slipped and landed on my operative leg/foot. Put screws into heal of foot to the tibia to fuse together.It was no weight bearing for 2 weeks. Surgery was Dec. 19.

## 2017-10-01 NOTE — ED Provider Notes (Signed)
[facility] EMERGENCY DEPARTMENT Provider Note   CSN: [id number] Arrival date & time: 10/01/17  1153     History   Chief Complaint Chief Complaint  Patient presents with  . Post-op Problem  . Fall  . Foot Pain    HPI Tom White is a 56 y.o. male who presents the emergency department status post mechanical fall today just PTA complaining of left foot and right elbow pain.  Of significant note patient underwent aposterior arthroscopic subtalar arthrodesis on 09/22/2017 due to fibrous nonunion subtalar fusion left foot. Surgery performed by Dr. Sharol Given.  Plan following surgery was 1 week follow-up for dressing change and 2-week follow-up for suture removal.  Postop patient was to be strict nonweightbearing for 6 weeks with plan for fracture boot for first 2 weeks.  Today he was trying to shower and slipped and fell, this was purely a mechanical fall.  Denies chest pain, dyspnea, dizziness, or lightheadedness prior to fall or at present.  Patient states that he will hit the left foot and right elbow during the fall.  No head injury or loss of consciousness.  At present having pain to areas of injury. Improving with Ibuprofen ordered in ED. Denies numbness, weakness, tingling, neck pain, or back pain.    HPI  Past Medical History:  Diagnosis Date  . Anxiety   . Arthritis    "had it in my left ankle" (05/04/2017)  . Depression   . GERD (gastroesophageal reflux disease)   . Pneumonia 2017    Patient Active Problem List   Diagnosis Date Noted  . Pain in left ankle and joints of left foot 06/17/2017  . Foot laceration 05/03/2017  . 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 Inflammatory heel pain, right 01/21/2017    Past Surgical History:  Procedure Laterality Date  . ANKLE ARTHROSCOPY Left ~ 2013   "scraped out arthritis"  . ANKLE ARTHROSCOPY WITH FUSION Left 09/22/2017   Procedure: LEFT ANKLE POSTERIOR ARTHROSCOPIC SUBTALAR ARTHRODESIS;  Surgeon: Newt Minion, MD;  Location: [place] [facility];  Service: Orthopedics;  Laterality: Left;  . ANKLE FRACTURE SURGERY Left 12/1980  . APPLICATION OF WOUND VAC Right 05/03/2017   foot  . APPLICATION OF WOUND VAC Right 05/03/2017   Procedure: APPLICATION OF WOUND VAC;  Surgeon: Marybelle Killings, MD;  Location: [place];  Service: Orthopedics;  Laterality: Right;  . FRACTURE SURGERY    . I&D EXTREMITY Right 05/03/2017   foot  . I&D EXTREMITY Right 05/03/2017   Procedure: IRRIGATION AND DEBRIDEMENT EXTREMITY RIGHT, REPAIR OF POSTERIOR TIBIAL TENDON;  Surgeon: Marybelle Killings, MD;  Location: [facility];  Service: Orthopedics;  Laterality: Right;  . [place]?  Marland Kitchen POSTERIOR TIBIAL TENDON REPAIR Right 05/03/2017  . SUBTALAR JOINT ARTHROEREISIS Left 1985?  . TONSILLECTOMY         Home Medications    Prior to Admission medications   Medication Sig Start Date End Date Taking? Authorizing Provider  acetaminophen (TYLENOL) 500 MG tablet Take 500 mg by mouth daily as needed for headache.    [provider]  buPROPion (WELLBUTRIN XL) 300 MG 24 hr tablet Take 300 mg by mouth daily. 02/22/17   [provider]  citalopram (CELEXA) 20 MG tablet Take 20 mg by mouth at bedtime. 02/22/17   [provider]  diazepam (VALIUM) 10 MG tablet Take 5-10 mg by mouth every 12 (twelve) hours as needed for anxiety or sleep (takes 1 every night at bedtime  and 0.5 tablet if needed in day for anxiety).     [provider]  fluticasone (FLONASE) 50 MCG/ACT nasal spray Place 1 spray into both nostrils 2 (two) times daily.    [provider]  HYDROcodone-acetaminophen (NORCO) 5-325 MG tablet Take 1-2 tablets by mouth every 4 (four) hours as needed for moderate pain. Patient not taking: Reported on 09/15/2017 05/04/17   Marybelle Killings, MD  Lactobacillus Rhamnosus, GG, (CULTURELLE) CAPS Take 1 tablet by mouth daily.    [provider]  Menthol, Topical Analgesic, (ICY HOT) 7.5 %  (Roll) MISC Apply 1 each topically daily as needed (muscle pain).    [provider]  Multiple Vitamins-Minerals (AIRBORNE PO) Take by mouth.    [provider]  naproxen sodium (ALEVE) 220 MG tablet Take 220 mg by mouth daily as needed (PAIN).    [provider]  oxyCODONE-acetaminophen (PERCOCET/ROXICET) 5-325 MG tablet Take 1-2 tablets by mouth every 4 (four) hours as needed for severe pain. 09/22/17   Newt Minion, MD  polyethylene glycol [facility] / Floria Raveling) packet Take 17 g by mouth daily.    [provider]    Family History History reviewed. No pertinent family history.  Social History Social History   Tobacco Use  . Smoking status: Former Smoker    Packs/day: 1.00    Years: 15.00    Pack years: 15.00    Types: Cigarettes    Last attempt to quit: 1995    Years since quitting: 24.0  . Smokeless tobacco: Never Used  Substance Use Topics  . 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 Drug use: No     Allergies   Ancef [cefazolin]   Review of Systems Review of Systems  Constitutional: Negative for chills and fever.  Gastrointestinal: Negative for nausea and vomiting.  Musculoskeletal: Positive for arthralgias (R elbow and L foot). Negative for back pain and neck pain.  Neurological: Negative for weakness, numbness and headaches.     Physical Exam Updated Vital Signs BP 116/84 (BP Location: Left Arm)   Pulse 80   Temp 98.1 F (36.7 C) (Oral)   Resp 16   Ht 5\' 9"  (1.753 m)   Wt 81.6 kg (180 lb)   SpO2 97%   BMI 26.58 kg/m   Physical Exam  Constitutional: He appears well-developed and well-nourished.  Non-toxic appearance. No distress.  HENT:  Head: Normocephalic and atraumatic.  Right Ear: No hemotympanum.  Left Ear: No hemotympanum.  Mouth/Throat: Uvula is midline and oropharynx is clear and moist.  Eyes: Conjunctivae are normal. Pupils are equal, round, and reactive to light.  Right eye exhibits no discharge. Left eye exhibits no discharge.  Neck: Full passive range of motion without pain. Neck supple. No spinous process tenderness present.  Cardiovascular: Normal rate and regular rhythm.  No murmur heard. Pulmonary/Chest: Breath sounds normal. No respiratory distress. He has no wheezes. He has no rales.  Abdominal: Soft. He exhibits no distension. There is no tenderness.  Musculoskeletal:  Back: no midline tenderness Upper extremities: 3 cm superficial abrasion to medial forearm, no active bleeding.  No obvious deformity, erythema, warmth, or appreciable swelling.  Patient has full range of motions at all joints of the upper extremities.  Patient with tenderness to palpation over the medial epicondyle of the elbow.  Otherwise no bony tenderness, no olecranon, lateral epicondyle, or radial head tenderness.  Lower extremities: Patient with some healing bruise discoloration to lateral  aspects of the ankle. Diffuse swelling to ankle/posterior foot. Patient with well healing surgical incisions- there are 5 total sutures in place with what appears to be one horizontal mattress suture missing which came out during fall. No active bleeding. No erythema, warmth, discharge, or fluctuance.  Patient is diffusely tender to the medial and lateral ankle as well as the calcaneus area.  No tenderness at the base of the fifth or navicular bone Patient able to move all digits.  Neurological: He is alert.  Clear speech.   Skin: Skin is warm and dry. No rash noted.  Psychiatric: He has a normal mood and affect. His behavior is normal.  Nursing note and vitals reviewed.   ED Treatments / Results  Labs (all labs ordered are listed, but only abnormal results are displayed) Labs Reviewed - No data to display  EKG  EKG Interpretation None       Radiology Dg Elbow Complete Right  Result Date: 10/01/2017 CLINICAL DATA:  Status post fall today with right elbow pain. EXAM: RIGHT ELBOW  - COMPLETE 3+ VIEW COMPARISON:  None. FINDINGS: There is no evidence of fracture, dislocation, or joint effusion osteophytosis is identified in the proximal ulna. Soft tissues are unremarkable. IMPRESSION: No acute fracture or dislocation. Electronically Signed   By: Abelardo Diesel M.D.   On: 10/01/2017 15:00   Dg Foot Complete Left  Result Date: 10/01/2017 CLINICAL DATA:  Injury. EXAM: LEFT FOOT - COMPLETE 3+ VIEW COMPARISON:  09/16/2015. FINDINGS: No acute bony or joint abnormality identified. No evidence of fracture dislocation. Postsurgical changes talocalcaneal region. Hardware intact. Anatomic alignment. IMPRESSION: No acute abnormality identified. Postsurgical changes talocalcaneal area. Hardware intact. Anatomic alignment. Electronically Signed   By: Marcello Moores  Register   On: 10/01/2017 13:24    Procedures Procedures (including critical care time)  Medications Ordered in ED Medications  ibuprofen (ADVIL,MOTRIN) tablet 800 mg (800 mg Oral Given 10/01/17 1335)     Initial Impression / Assessment and Plan / ED Course  I have reviewed the triage vital signs and the nursing notes.  Pertinent labs & imaging results that were available during my care of the patient were reviewed by me and considered in my medical decision making (see chart for details).   Patient presents s/p mechanical fall with R elbow pain and L foot pain in the setting of recent L foot surgery 12/19 by Dr. Sharol Given. Patient is nontoxic appearing with stable vital signs. He is neurovascularly intact distal to both areas of pain. X-rays ordered of R elbow and L foot given areas of tenderness, negative for fracture or dislocation. Specifically L foot x-ray with anatomic alignment and hardware intact. Patient's wife spoke with Dr. Jess Barters office today and they have rearranged for follow up appointment on 12/31. Patient's surgical scars appear well healing, there are no signs of infection, the area where the suture fell out does not  appear gaping or open, do not think that replacing suture is required. Patient will need to follow strict non weight bearing instructions per Dr. Sharol Given and follow up accordingly. Dressing placed over surgical incision sites. I discussed results, treatment plan, need for ortho follow-up, and return precautions with the patient and his wife. Provided opportunity for questions, patient and his wife confirmed understanding and are in agreement with plan.   Final Clinical Impressions(s) / ED Diagnoses   Final diagnoses:  Fall, initial encounter    ED Discharge Orders    None       Brookelynn Hamor, Glynda Jaeger, PA-C  10/01/17 1711    Pattricia Boss, MD 10/02/17 [phone]

## 2017-10-01 NOTE — Telephone Encounter (Signed)
Tom White..  Patient called triage line. He just fell in the shower, landed on heel rather hard, and has busted open his sutures. He states that he is bleeding, but no profusely.  I advised patient that there are no providers in the clinic this afternoon. He can reinforce the dressing around his ankle but will need to go to the ED to be seen.

## 2017-10-01 NOTE — Telephone Encounter (Signed)
I spoke with patient this morning. There is another message that is in chart that has been sent to Dr. Sharol Given informing him of the patient's fall and injury this morning. Patient's wife called the office back concerned that she had not received a call from the office. She spoke with Chenise and stated the ED wondered why the patient did not call the office and stated there should be a doctor on call. I explained to Chenise that I had urged patient to go to the ED as there are no providers scheduled in clinic in the office this afternoon. Dr. Erlinda Hong is on practice call and routinely the hospital will reach out to him if there is a problem they need addressed. I did advise we would work patient in to Dr. Jess Barters schedule on Monday at [facility] if they would like to come in then. Patient's wife wanted to make this appt.

## 2017-10-04 ENCOUNTER — Encounter (INDEPENDENT_AMBULATORY_CARE_PROVIDER_SITE_OTHER): Payer: Self-pay | Admitting: Orthopedic Surgery

## 2017-10-04 ENCOUNTER — Ambulatory Visit (INDEPENDENT_AMBULATORY_CARE_PROVIDER_SITE_OTHER): Payer: BC Managed Care – PPO | Admitting: Orthopedic Surgery

## 2017-10-04 DIAGNOSIS — M24672 Ankylosis, left ankle: Secondary | ICD-10-CM

## 2017-10-04 NOTE — Progress Notes (Signed)
Office Visit Note   Patient: Tom White           Date of Birth: [birth year]           MRN: [id number] Visit Date: 10/04/2017              Requested by: Chesley Noon, MD [address] [place], [place] [address] PCP: Chesley Noon, MD  Chief Complaint  Patient presents with  . Left Foot - Routine Post Op      HPI: Patient is a 56 year old gentleman who presents status post posterior arthroscopic subtalar arthrodesis.  Patient states he fell in his shower had increased bleeding he went to the emergency room radiographs are reviewed which shows no complicating features of the fixation.  Assessment & Plan: Visit Diagnoses:  1. Fibrosis of subtalar joint, left     Plan: Recommended patient work on range of motion exercises of his ankle he may begin showering and getting the incision wet recommended that he wear the 15-20 mm of medical compression stockings around the clock.  Continue nonweightbearing.  2 view radiographs of the left ankle at follow-up.  Follow-Up Instructions: Return in about 3 weeks (around 10/25/2017).   Ortho Exam  Patient is alert, oriented, no adenopathy, well-dressed, normal affect, normal respiratory effort. Examination there is no redness no cellulitis no wound dehiscence.  Patient has good range of motion of his ankle.  There is no swelling.  Imaging: No results found. No images are attached to the encounter.  Labs: No results found for: HGBA1C, ESRSEDRATE, CRP, LABURIC, REPTSTATUS, GRAMSTAIN, CULT, LABORGA  @LABSALLVALUES (HGBA1)@  There is no height or weight on file to calculate BMI.  Orders:  No orders of the defined types were placed in this encounter.  No orders of the defined types were placed in this encounter.    Procedures: No procedures performed  Clinical Data: No additional findings.  ROS:  All other systems negative, except as noted in the HPI. Review of Systems  Objective: Vital Signs: There were no vitals  taken for this visit.  Specialty Comments:  No specialty comments available.  PMFS History: Patient Active Problem List   Diagnosis Date Noted  . Pain in left ankle and joints of left foot 06/17/2017  . Foot laceration 05/03/2017  . 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 Inflammatory heel pain, right 01/21/2017   Past Medical History:  Diagnosis Date  . Anxiety   . Arthritis    "had it in my left ankle" (05/04/2017)  . Depression   . GERD (gastroesophageal reflux disease)   . Pneumonia 2017    History reviewed. No pertinent family history.  Past Surgical History:  Procedure Laterality Date  . ANKLE ARTHROSCOPY Left ~ 2013   "scraped out arthritis"  . ANKLE ARTHROSCOPY WITH FUSION Left 09/22/2017   Procedure: LEFT ANKLE POSTERIOR ARTHROSCOPIC SUBTALAR ARTHRODESIS;  Surgeon: Newt Minion, MD;  Location: [place];  Service: Orthopedics;  Laterality: Left;  . ANKLE FRACTURE SURGERY Left 12/1980  . APPLICATION OF WOUND VAC Right 05/03/2017   foot  . APPLICATION OF WOUND VAC Right 05/03/2017   Procedure: APPLICATION OF WOUND VAC;  Surgeon: Marybelle Killings, MD;  Location: [place];  Service: Orthopedics;  Laterality: Right;  . FRACTURE SURGERY    . I&D EXTREMITY Right 05/03/2017   foot  . I&D EXTREMITY Right 05/03/2017   Procedure: IRRIGATION AND DEBRIDEMENT EXTREMITY RIGHT, REPAIR OF POSTERIOR TIBIAL TENDON;  Surgeon: Marybelle Killings, MD;  Location: [place];  Service: Orthopedics;  Laterality: Right;  . [place]?  Marland Kitchen POSTERIOR TIBIAL TENDON REPAIR Right 05/03/2017  . SUBTALAR JOINT ARTHROEREISIS Left 1985?  . TONSILLECTOMY     Social History   Occupational History  . Not on file  Tobacco Use  . Smoking status: Former Smoker    Packs/day: 1.00    Years: 15.00    Pack years: 15.00    Types: Cigarettes    Last attempt to quit: 1995    Years since quitting: 24.0  . Smokeless tobacco: Never Used  Substance and Sexual Activity  . Alcohol use: Yes     Comment: 05/04/2017 "recovering alcoholic since ~ 9672"  . Drug use: No  . Sexual activity: Not on file

## 2017-10-06 ENCOUNTER — Telehealth (INDEPENDENT_AMBULATORY_CARE_PROVIDER_SITE_OTHER): Payer: Self-pay

## 2017-10-06 ENCOUNTER — Ambulatory Visit (INDEPENDENT_AMBULATORY_CARE_PROVIDER_SITE_OTHER): Payer: BC Managed Care – PPO | Admitting: Orthopedic Surgery

## 2017-10-06 NOTE — Telephone Encounter (Signed)
I called and spoke with patient to advise of message below, he expressed understanding.

## 2017-10-06 NOTE — Telephone Encounter (Signed)
Patient would like to know if he would be able to do a trip to [place], being that he just had surgery on 10/01/17.  Stated that he has a death in his family.  CB# is [phone].  Please advise.  Thank You.

## 2017-10-06 NOTE — Telephone Encounter (Signed)
OK to travel, he needs either a wheelchair or kneeling scooter for ambulation, to keep weight off his foot

## 2017-10-07 ENCOUNTER — Ambulatory Visit (INDEPENDENT_AMBULATORY_CARE_PROVIDER_SITE_OTHER): Payer: BC Managed Care – PPO | Admitting: Orthopedic Surgery

## 2017-10-28 ENCOUNTER — Encounter (INDEPENDENT_AMBULATORY_CARE_PROVIDER_SITE_OTHER): Payer: Self-pay | Admitting: Orthopedic Surgery

## 2017-10-28 ENCOUNTER — Ambulatory Visit (INDEPENDENT_AMBULATORY_CARE_PROVIDER_SITE_OTHER): Payer: BC Managed Care – PPO | Admitting: Orthopedic Surgery

## 2017-10-28 ENCOUNTER — Ambulatory Visit (INDEPENDENT_AMBULATORY_CARE_PROVIDER_SITE_OTHER): Payer: BC Managed Care – PPO

## 2017-10-28 VITALS — Ht 69.0 in | Wt 180.0 lb

## 2017-10-28 DIAGNOSIS — M24672 Ankylosis, left ankle: Secondary | ICD-10-CM

## 2017-10-28 NOTE — Progress Notes (Signed)
Office Visit Note   Patient: Sathvik Tiedt           Date of Birth: Dec 15, 1960           MRN: [id number] Visit Date: 10/28/2017              Requested by: Chesley Noon, MD [address] [place],  [address] PCP: Chesley Noon, MD  Chief Complaint  Patient presents with  . Left Ankle - Routine Post Op    09/22/17 left ankle posterior arthroscopic subtalar arthrodesis       HPI: Patient presents 5 weeks status post posterior arthroscopic subtalar arthrodesis on the left.  Patient complains of some pain anteriorly over the ankle.  He states that the hindfoot is nonpainful.  Assessment & Plan: Visit Diagnoses:  1. Fibrosis of subtalar joint, left     Plan: Patient will advance to a stiff soled walking shoe continue with compression stocking follow-up in 4 weeks if he is symptomatic.  Follow-Up Instructions: Return in about 4 weeks (around 11/25/2017).   Ortho Exam  Patient is alert, oriented, no adenopathy, well-dressed, normal affect, normal respiratory effort. Examination there is very minimal swelling of the left foot he has good range of motion of the ankle there is no redness no cellulitis no signs of infection.  Imaging: Xr Ankle 2 Views Left  Result Date: 10/28/2017 2 view radiographs of the left hindfoot shows stable subtalar fusion no hardware complications the ankle joint is congruent.  No images are attached to the encounter.  Labs: No results found for: HGBA1C, ESRSEDRATE, CRP, LABURIC, REPTSTATUS, GRAMSTAIN, CULT, LABORGA  @LABSALLVALUES (HGBA1)@  Body mass index is 26.58 kg/m.  Orders:  Orders Placed This Encounter  Procedures  . XR Ankle 2 Views Left   No orders of the defined types were placed in this encounter.    Procedures: No procedures performed  Clinical Data: No additional findings.  ROS:  All other systems negative, except as noted in the HPI. Review of Systems  Objective: Vital Signs: Ht 5\' 9"  (1.753 m)   Wt  180 lb (81.6 kg)   BMI 26.58 kg/m   Specialty Comments:  No specialty comments available.  PMFS History: Patient Active Problem List   Diagnosis Date Noted  . Pain in left ankle and joints of left foot 06/17/2017  . Foot laceration 05/03/2017  . 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 Inflammatory heel pain, right 01/21/2017   Past Medical History:  Diagnosis Date  . Anxiety   . Arthritis    "had it in my left ankle" (05/04/2017)  . Depression   . GERD (gastroesophageal reflux disease)   . Pneumonia 2017    History reviewed. No pertinent family history.  Past Surgical History:  Procedure Laterality Date  . ANKLE ARTHROSCOPY Left ~ 2013   "scraped out arthritis"  . ANKLE ARTHROSCOPY WITH FUSION Left 09/22/2017   Procedure: LEFT ANKLE POSTERIOR ARTHROSCOPIC SUBTALAR ARTHRODESIS;  Surgeon: Newt Minion, MD;  Location: [place];  Service: Orthopedics;  Laterality: Left;  . ANKLE FRACTURE SURGERY Left 12/1980  . APPLICATION OF WOUND VAC Right 05/03/2017   foot  . APPLICATION OF WOUND VAC Right 05/03/2017   Procedure: APPLICATION OF WOUND VAC;  Surgeon: Marybelle Killings, MD;  Location: [place];  Service: Orthopedics;  Laterality: Right;  . FRACTURE SURGERY    . I&D EXTREMITY Right 05/03/2017   foot  . I&D EXTREMITY Right 05/03/2017   Procedure: IRRIGATION AND DEBRIDEMENT EXTREMITY  RIGHT, REPAIR OF POSTERIOR TIBIAL TENDON;  Surgeon: Marybelle Killings, MD;  Location: [place];  Service: Orthopedics;  Laterality: Right;  . [place]?  Marland Kitchen POSTERIOR TIBIAL TENDON REPAIR Right 05/03/2017  . SUBTALAR JOINT ARTHROEREISIS Left 1985?  . TONSILLECTOMY     Social History   Occupational History  . Not on file  Tobacco Use  . Smoking status: Former Smoker    Packs/day: 1.00    Years: 15.00    Pack years: 15.00    Types: Cigarettes    Last attempt to quit: 1995    Years since quitting: 24.0  . Smokeless tobacco: Never Used  Substance and Sexual Activity  .  Alcohol use: Yes    Comment: 05/04/2017 "recovering alcoholic since ~ 5436"  . Drug use: No  . Sexual activity: Not on file

## 2017-12-02 ENCOUNTER — Ambulatory Visit (INDEPENDENT_AMBULATORY_CARE_PROVIDER_SITE_OTHER): Payer: BC Managed Care – PPO | Admitting: Orthopedic Surgery

## 2017-12-02 ENCOUNTER — Encounter (INDEPENDENT_AMBULATORY_CARE_PROVIDER_SITE_OTHER): Payer: Self-pay | Admitting: Orthopedic Surgery

## 2017-12-02 VITALS — Ht 69.0 in | Wt 180.0 lb

## 2017-12-02 DIAGNOSIS — M25872 Other specified joint disorders, left ankle and foot: Secondary | ICD-10-CM

## 2017-12-02 MED ORDER — METHYLPREDNISOLONE ACETATE 40 MG/ML IJ SUSP
40.0000 mg | INTRAMUSCULAR | Status: AC | PRN
  Administered 2017-12-02: 40 mg via INTRA_ARTICULAR

## 2017-12-02 MED ORDER — LIDOCAINE HCL 1 % IJ SOLN
2.0000 mL | INTRAMUSCULAR | Status: AC | PRN
  Administered 2017-12-02: 2 mL

## 2017-12-02 NOTE — Progress Notes (Signed)
Office Visit Note   Patient: Tom White           Date of Birth: 1961/08/31           MRN: [id number] Visit Date: 12/02/2017              Requested by: Chesley Noon, MD [address] [place], [place] [address] PCP: Chesley Noon, MD  Chief Complaint  Patient presents with  . Left Ankle - Pain, Routine Post Op    09/22/17 Left ankle posterior arthroscopic subtalar arthrodesis.      HPI: Patient is a 57 year old gentleman who presents in follow-up for his left ankle.  He states that he does not have subtalar pain but is having pain primarily anterior laterally of the ankle joint with swelling.  He states the knee-high compression socks did not feel comfortable but he is currently wearing the crew socks which are comfortable.  He walks with a cane.  Assessment & Plan: Visit Diagnoses:  1. Impingement syndrome of left ankle     Plan: The ankle joint was injected discussed that we could follow-up as needed.  Discussed that we could repeat injections and may need to consider arthroscopic debridement if he still has swelling and pain anteriorly in the ankle.  Follow-Up Instructions: Return if symptoms worsen or fail to improve.   Ortho Exam  Patient is alert, oriented, no adenopathy, well-dressed, normal affect, normal respiratory effort. Examination patient has a palpable dorsalis pedis and posterior tibial pulse he has good ankle range of motion.  The sinus Tarsi is nontender to palpation.  He has some pain posteriorly in the ankle and pain primarily anterior laterally of the ankle joint with visible swelling there is no redness no cellulitis.  Imaging: No results found. No images are attached to the encounter.  Labs: No results found for: HGBA1C, ESRSEDRATE, CRP, LABURIC, REPTSTATUS, GRAMSTAIN, CULT, LABORGA  @LABSALLVALUES (HGBA1)@  Body mass index is 26.58 kg/m.  Orders:  No orders of the defined types were placed in this encounter.  No orders of the  defined types were placed in this encounter.    Procedures: Medium Joint Inj: L ankle on 12/02/2017 8:55 AM Indications: pain and diagnostic evaluation Details: 22 G 1.5 in needle, anteromedial approach Medications: 2 mL lidocaine 1 %; 40 mg methylPREDNISolone acetate 40 MG/ML Outcome: tolerated well, no immediate complications Procedure, treatment alternatives, risks and benefits explained, specific risks discussed. Consent was given by the patient. Immediately prior to procedure a time out was called to verify the correct patient, procedure, equipment, support staff and site/side marked as required. Patient was prepped and draped in the usual sterile fashion.      Clinical Data: No additional findings.  ROS:  All other systems negative, except as noted in the HPI. Review of Systems  Objective: Vital Signs: Ht 5\' 9"  (1.753 m)   Wt 180 lb (81.6 kg)   BMI 26.58 kg/m   Specialty Comments:  No specialty comments available.  PMFS History: Patient Active Problem List   Diagnosis Date Noted  . Pain in left ankle and joints of left foot 06/17/2017  . Foot laceration 05/03/2017  . 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 Inflammatory heel pain, right 01/21/2017   Past Medical History:  Diagnosis Date  . Anxiety   . Arthritis    "had it in my left ankle" (05/04/2017)  . Depression   . GERD (gastroesophageal reflux disease)   . Pneumonia 2017    History  reviewed. No pertinent family history.  Past Surgical History:  Procedure Laterality Date  . ANKLE ARTHROSCOPY Left ~ 2013   "scraped out arthritis"  . ANKLE ARTHROSCOPY WITH FUSION Left 09/22/2017   Procedure: LEFT ANKLE POSTERIOR ARTHROSCOPIC SUBTALAR ARTHRODESIS;  Surgeon: Newt Minion, MD;  Location: [facility];  Service: Orthopedics;  Laterality: Left;  . ANKLE FRACTURE SURGERY Left 12/1980  . APPLICATION OF WOUND VAC Right 05/03/2017   foot  . APPLICATION OF WOUND VAC Right 05/03/2017   Procedure: APPLICATION OF  WOUND VAC;  Surgeon: Marybelle Killings, MD;  Location: [facility];  Service: Orthopedics;  Laterality: Right;  . FRACTURE SURGERY    . I&D EXTREMITY Right 05/03/2017   foot  . I&D EXTREMITY Right 05/03/2017   Procedure: IRRIGATION AND DEBRIDEMENT EXTREMITY RIGHT, REPAIR OF POSTERIOR TIBIAL TENDON;  Surgeon: Marybelle Killings, MD;  Location: [facility];  Service: Orthopedics;  Laterality: Right;  . [place]?  Marland Kitchen POSTERIOR TIBIAL TENDON REPAIR Right 05/03/2017  . SUBTALAR JOINT ARTHROEREISIS Left 1985?  . TONSILLECTOMY     Social History   Occupational History  . Not on file  Tobacco Use  . Smoking status: Former Smoker    Packs/day: 1.00    Years: 15.00    Pack years: 15.00    Types: Cigarettes    Last attempt to quit: 1995    Years since quitting: 24.1  . Smokeless tobacco: Never Used  Substance and Sexual Activity  . Alcohol use: Yes    Comment: 05/04/2017 "recovering alcoholic since ~ 3149"  . Drug use: No  . Sexual activity: Not on file

## 2017-12-22 ENCOUNTER — Other Ambulatory Visit (INDEPENDENT_AMBULATORY_CARE_PROVIDER_SITE_OTHER): Payer: Self-pay

## 2017-12-22 ENCOUNTER — Ambulatory Visit (INDEPENDENT_AMBULATORY_CARE_PROVIDER_SITE_OTHER): Payer: BC Managed Care – PPO | Admitting: Orthopedic Surgery

## 2017-12-22 ENCOUNTER — Encounter (INDEPENDENT_AMBULATORY_CARE_PROVIDER_SITE_OTHER): Payer: Self-pay | Admitting: Orthopedic Surgery

## 2017-12-22 VITALS — Ht 69.0 in | Wt 180.0 lb

## 2017-12-22 DIAGNOSIS — M25572 Pain in left ankle and joints of left foot: Secondary | ICD-10-CM

## 2017-12-22 DIAGNOSIS — M25872 Other specified joint disorders, left ankle and foot: Secondary | ICD-10-CM

## 2017-12-22 DIAGNOSIS — G8929 Other chronic pain: Secondary | ICD-10-CM

## 2017-12-22 DIAGNOSIS — M24672 Ankylosis, left ankle: Secondary | ICD-10-CM

## 2017-12-22 NOTE — Addendum Note (Signed)
Addended by: Pamella Pert on: 12/22/2017 04:38 PM   Modules accepted: Orders

## 2017-12-22 NOTE — Progress Notes (Signed)
Office Visit Note   Patient: Tom White           Date of Birth: 1961/01/07           MRN: [id number] Visit Date: 12/22/2017              Requested by: Chesley Noon, MD [address] [place], [place] [address] PCP: Chesley Noon, MD  Chief Complaint  Patient presents with  . Left Ankle - Follow-up    09/22/17 PASTA      HPI: Patient is a 57 year old gentleman is seen in follow-up E status post revision of his subtalar fusion with a posterior arthroscopic subtalar arthrodesis with a previous staple in the sinus Tarsi which is prominent.  Patient recently underwent a ankle injection.  He states that this resolved the ankle swelling which he states has started up again but did not do much for his pain.  Patient states he has been completely disabled recently due to the increasing ankle pain.  Patient states he cannot ambulate other than with the crutches and protected weightbearing.  Assessment & Plan: Visit Diagnoses:  1. Impingement syndrome of left ankle   2. Fibrosis of subtalar joint, left     Plan: Will obtain a CT scan to evaluate the tibiotalar joint and evaluate the subtalar fusion.  Discussed with the patient we may need to consider an ankle arthroscopy and possible removal of his old staple.  Patient is given a handicap parking for 5 years discussed with the patient that he will be disabled for at least a year he currently is totally disabled and unable to ambulate and walk or carry any weight due to his ankle symptoms.  We will need to complete disability paperwork for him.  We will draw a uric acid level to see if gout is a component to his symptoms.  Follow-Up Instructions: Return if symptoms worsen or fail to improve.   Ortho Exam  Patient is alert, oriented, no adenopathy, well-dressed, normal affect, normal respiratory effort. Examination patient has a palpable dorsalis pedis and posterior tibial pulse.  He has swelling anteriorly over the ankle which  is exquisitely tender to palpation over the ankle there is no redness no cellulitis.  He has tenderness to palpation over the previous staple he has pain with range of motion of the ankle.  His previous radiographs are reviewed which shows what appears to be a stable subtalar fusion.  Patient has no dystrophic changes no hypersensitivity to light touch.  His foot is plantigrade.  Imaging: No results found. No images are attached to the encounter.  Labs: No results found for: HGBA1C, ESRSEDRATE, CRP, LABURIC, REPTSTATUS, GRAMSTAIN, CULT, LABORGA  @LABSALLVALUES (HGBA1)@  Body mass index is 26.58 kg/m.  Orders:  No orders of the defined types were placed in this encounter.  No orders of the defined types were placed in this encounter.    Procedures: No procedures performed  Clinical Data: No additional findings.  ROS:  All other systems negative, except as noted in the HPI. Review of Systems  Objective: Vital Signs: Ht 5\' 9"  (1.753 m)   Wt 180 lb (81.6 kg)   BMI 26.58 kg/m   Specialty Comments:  No specialty comments available.  PMFS History: Patient Active Problem List   Diagnosis Date Noted  . Pain in left ankle and joints of left foot 06/17/2017  . Foot laceration 05/03/2017  . 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 Inflammatory heel pain, right 01/21/2017  Past Medical History:  Diagnosis Date  . Anxiety   . Arthritis    "had it in my left ankle" (05/04/2017)  . Depression   . GERD (gastroesophageal reflux disease)   . Pneumonia 2017    History reviewed. No pertinent family history.  Past Surgical History:  Procedure Laterality Date  . ANKLE ARTHROSCOPY Left ~ 2013   "scraped out arthritis"  . ANKLE ARTHROSCOPY WITH FUSION Left 09/22/2017   Procedure: LEFT ANKLE POSTERIOR ARTHROSCOPIC SUBTALAR ARTHRODESIS;  Surgeon: Newt Minion, MD;  Location: [facility];  Service: Orthopedics;  Laterality: Left;  . ANKLE FRACTURE SURGERY Left 12/1980  .  APPLICATION OF WOUND VAC Right 05/03/2017   foot  . APPLICATION OF WOUND VAC Right 05/03/2017   Procedure: APPLICATION OF WOUND VAC;  Surgeon: Marybelle Killings, MD;  Location: [facility];  Service: Orthopedics;  Laterality: Right;  . FRACTURE SURGERY    . I&D EXTREMITY Right 05/03/2017   foot  . I&D EXTREMITY Right 05/03/2017   Procedure: IRRIGATION AND DEBRIDEMENT EXTREMITY RIGHT, REPAIR OF POSTERIOR TIBIAL TENDON;  Surgeon: Marybelle Killings, MD;  Location: [employer] [facility];  Service: Orthopedics;  Laterality: Right;  . [place]?  Marland Kitchen POSTERIOR TIBIAL TENDON REPAIR Right 05/03/2017  . SUBTALAR JOINT ARTHROEREISIS Left 1985?  . TONSILLECTOMY     Social History   Occupational History  . Not on file  Tobacco Use  . Smoking status: Former Smoker    Packs/day: 1.00    Years: 15.00    Pack years: 15.00    Types: Cigarettes    Last attempt to quit: 1995    Years since quitting: 24.2  . Smokeless tobacco: Never Used  Substance and Sexual Activity  . Alcohol use: Yes    Comment: 05/04/2017 "recovering alcoholic since ~ 9563"  . Drug use: No  . Sexual activity: Not on file

## 2017-12-23 ENCOUNTER — Telehealth (INDEPENDENT_AMBULATORY_CARE_PROVIDER_SITE_OTHER): Payer: Self-pay | Admitting: Radiology

## 2017-12-23 ENCOUNTER — Ambulatory Visit
Admission: RE | Admit: 2017-12-23 | Discharge: 2017-12-23 | Disposition: A | Discharge status: Home / Self Care | Payer: BC Managed Care – PPO | Source: Ambulatory Visit | Attending: Orthopedic Surgery | Admitting: Orthopedic Surgery

## 2017-12-23 DIAGNOSIS — M25572 Pain in left ankle and joints of left foot: Secondary | ICD-10-CM

## 2017-12-23 LAB — URIC ACID: URIC ACID, SERUM: 5.9 mg/dL (ref 4.0–8.0)

## 2017-12-23 NOTE — Telephone Encounter (Signed)
Patient is having CT scan today 12/23/17. I called patient to schedule CT review appointment patient stated he will be out of town for [place] and requested for results to be called in [phone]  I informed him I would send a message about that but if he does not hear from Dr. Sharol Given to give the office a call once back in town to schedule appointment.

## 2017-12-28 NOTE — Telephone Encounter (Signed)
Pt's CT scan is available for review. Pt would like for you to call him at the number below to advise of findings.

## 2017-12-28 NOTE — Telephone Encounter (Signed)
CT- scan shows interval healing with bone across the fusion site, no complications

## 2017-12-28 NOTE — Telephone Encounter (Signed)
Called and lm on vm to advise of message below.  

## 2017-12-28 NOTE — Telephone Encounter (Signed)
Patient left a vm asking for a call back about the vm you left him. # [phone]

## 2017-12-29 NOTE — Telephone Encounter (Signed)
Patient called stating that he has some concerns about the vm you left him yesterday.  Thank you.

## 2017-12-29 NOTE — Telephone Encounter (Signed)
Called pt and he is concerned that if his scan did not show any problems with healing then why is in the pain that he is in now and what is it that he can do to try and improve function and strengthen in this ankle so that he can become more functional. I advised that Dr. Sharol Given is in surgery today and that I would hold this message for review tomorrow.

## 2017-12-30 NOTE — Telephone Encounter (Signed)
Please see message below and call pt to discuss.

## 2017-12-30 NOTE — Telephone Encounter (Signed)
Call patient, a cast would be the best option at this time to facilitate the remainder of the healing and decrease pain.

## 2017-12-30 NOTE — Telephone Encounter (Signed)
I called pt and advised of message below he states that when he gets back into town in the next several weeks that he will call the office to make an appt. He wanted to know if he could resume wearing his fx boot and if this would help the pain. I advised that it would immobilize the joint and it could help but that he would need to discuss this with Dr. Sharol Given. Pt voiced understanding and that he will call when he is ready.

## 2018-01-18 ENCOUNTER — Ambulatory Visit (INDEPENDENT_AMBULATORY_CARE_PROVIDER_SITE_OTHER): Payer: BC Managed Care – PPO | Admitting: Orthopedic Surgery

## 2018-01-18 ENCOUNTER — Encounter (INDEPENDENT_AMBULATORY_CARE_PROVIDER_SITE_OTHER): Payer: Self-pay | Admitting: Orthopedic Surgery

## 2018-01-18 VITALS — Ht 69.0 in | Wt 180.0 lb

## 2018-01-18 DIAGNOSIS — M25572 Pain in left ankle and joints of left foot: Secondary | ICD-10-CM | POA: Diagnosis not present

## 2018-01-18 DIAGNOSIS — M25872 Other specified joint disorders, left ankle and foot: Secondary | ICD-10-CM | POA: Diagnosis not present

## 2018-01-18 MED ORDER — METHYLPREDNISOLONE ACETATE 40 MG/ML IJ SUSP
40.0000 mg | INTRAMUSCULAR | Status: AC | PRN
  Administered 2018-01-18: 40 mg via INTRA_ARTICULAR

## 2018-01-18 MED ORDER — LIDOCAINE HCL 1 % IJ SOLN
2.0000 mL | INTRAMUSCULAR | Status: AC | PRN
  Administered 2018-01-18: 2 mL

## 2018-01-18 NOTE — Progress Notes (Signed)
Office Visit Note   Patient: Tom White           Date of Birth: [birth year]           MRN: [id number] Visit Date: 01/18/2018              Requested by: Chesley Noon, MD [address] [place], [place] [address] PCP: Chesley Noon, MD  Chief Complaint  Patient presents with  . Left Ankle - Follow-up    S/p PASTA 09/2017      HPI: Patient is a 57 year old gentleman who presents in follow-up for left ankle impingement as well as left posterior arthroscopic subtalar arthrodesis with revision fusion.  Patient states that he was having pain in the ankle with regular shoewear he resume wearing the fracture boot and the ankle pain resolved with wearing the fracture boot.  Patient has had some temporary relief with previous ankle intra-articular injection.  Assessment & Plan: Visit Diagnoses:  1. Impingement syndrome of left ankle   2. Pain in left ankle and joints of left foot     Plan: Discussed that with patient's symptoms and MRI findings he clinically has impingement with osteoarthritis of the left ankle.  We will try a series of steroid injections to see if this can resolve his symptoms.  Discussed that if we cannot get sufficient resolution with the intra-articular ankle injections and arthroscopic debridement for the impingement would be his best option.  The ankle was injected without complications.  Reevaluate in 4 weeks for repeat injection if necessary.  Continue using the fracture boot for ambulation and decreased range of motion of the ankle.  Patient decided he did not want to pursue a short leg cast at this time.  Follow-Up Instructions: Return in about 1 month (around 02/15/2018).   Ortho Exam  Patient is alert, oriented, no adenopathy, well-dressed, normal affect, normal respiratory effort. Examination patient has no pain with attempted subtalar motion.  His CT scan is reviewed which shows some good bony bridging across the fusion site.  The screws are intact no  hardware failure.  There is not complete bony fusion but he does have's partial stable bony fusion.  The CT scan does show some degenerative changes of the ankle joint with some subcondylar cystic changes some loose bodies and some joint space narrowing in the ankle joint.  Patient does have a good pulse there are no changes with dystrophy there is no swelling no redness no temperature changes.  Patient is maximally tender to palpation anterior medially as well as anterior laterally over the ankle joint.  Maximum plantarflexion and dorsiflexion reproduces the ankle pain.  Attempted subtalar motion does not reproduce any pain.  His symptoms seem to be coming 100% from the ankle joint.  Imaging: No results found. No images are attached to the encounter.  Labs: Lab Results  Component Value Date   LABURIC 5.9 12/22/2017    @LABSALLVALUES (HGBA1)@  Body mass index is 26.58 kg/m.  Orders:  No orders of the defined types were placed in this encounter.  No orders of the defined types were placed in this encounter.    Procedures: Medium Joint Inj: L ankle on 01/18/2018 3:51 PM Indications: pain and diagnostic evaluation Details: 22 G 1.5 in needle, anteromedial approach Medications: 2 mL lidocaine 1 %; 40 mg methylPREDNISolone acetate 40 MG/ML Outcome: tolerated well, no immediate complications Procedure, treatment alternatives, risks and benefits explained, specific risks discussed. Consent was given by the patient. Immediately prior to  procedure a time out was called to verify the correct patient, procedure, equipment, support staff and site/side marked as required. Patient was prepped and draped in the usual sterile fashion.      Clinical Data: No additional findings.  ROS:  All other systems negative, except as noted in the HPI. Review of Systems  Objective: Vital Signs: Ht 5\' 9"  (1.753 m)   Wt 180 lb (81.6 kg)   BMI 26.58 kg/m   Specialty Comments:  No specialty comments  available.  PMFS History: Patient Active Problem List   Diagnosis Date Noted  . Pain in left ankle and joints of left foot 06/17/2017  . Foot laceration 05/03/2017  . 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 Inflammatory heel pain, right 01/21/2017   Past Medical History:  Diagnosis Date  . Anxiety   . Arthritis    "had it in my left ankle" (05/04/2017)  . Depression   . GERD (gastroesophageal reflux disease)   . Pneumonia 2017    History reviewed. No pertinent family history.  Past Surgical History:  Procedure Laterality Date  . ANKLE ARTHROSCOPY Left ~ 2013   "scraped out arthritis"  . ANKLE ARTHROSCOPY WITH FUSION Left 09/22/2017   Procedure: LEFT ANKLE POSTERIOR ARTHROSCOPIC SUBTALAR ARTHRODESIS;  Surgeon: Newt Minion, MD;  Location: [facility];  Service: Orthopedics;  Laterality: Left;  . ANKLE FRACTURE SURGERY Left 12/1980  . APPLICATION OF WOUND VAC Right 05/03/2017   foot  . APPLICATION OF WOUND VAC Right 05/03/2017   Procedure: APPLICATION OF WOUND VAC;  Surgeon: Marybelle Killings, MD;  Location: [facility];  Service: Orthopedics;  Laterality: Right;  . FRACTURE SURGERY    . I&D EXTREMITY Right 05/03/2017   foot  . I&D EXTREMITY Right 05/03/2017   Procedure: IRRIGATION AND DEBRIDEMENT EXTREMITY RIGHT, REPAIR OF POSTERIOR TIBIAL TENDON;  Surgeon: Marybelle Killings, MD;  Location: [facility];  Service: Orthopedics;  Laterality: Right;  . [place]?  Marland Kitchen POSTERIOR TIBIAL TENDON REPAIR Right 05/03/2017  . SUBTALAR JOINT ARTHROEREISIS Left 1985?  . TONSILLECTOMY     Social History   Occupational History  . Not on file  Tobacco Use  . Smoking status: Former Smoker    Packs/day: 1.00    Years: 15.00    Pack years: 15.00    Types: Cigarettes    Last attempt to quit: 1995    Years since quitting: 24.3  . Smokeless tobacco: Never Used  Substance and Sexual Activity  . Alcohol use: Yes    Comment: 05/04/2017 "recovering alcoholic since ~ 7616"  . Drug use:  No  . Sexual activity: Not on file

## 2018-01-20 ENCOUNTER — Telehealth (INDEPENDENT_AMBULATORY_CARE_PROVIDER_SITE_OTHER): Payer: Self-pay | Admitting: Orthopedic Surgery

## 2018-01-20 NOTE — Telephone Encounter (Signed)
Patient called to see if the office notes from 4/16 will be added to his MyChart or if we can add them.

## 2018-02-15 ENCOUNTER — Ambulatory Visit (INDEPENDENT_AMBULATORY_CARE_PROVIDER_SITE_OTHER): Payer: BC Managed Care – PPO

## 2018-02-15 ENCOUNTER — Encounter (INDEPENDENT_AMBULATORY_CARE_PROVIDER_SITE_OTHER): Payer: Self-pay | Admitting: Orthopedic Surgery

## 2018-02-15 ENCOUNTER — Ambulatory Visit (INDEPENDENT_AMBULATORY_CARE_PROVIDER_SITE_OTHER): Payer: BC Managed Care – PPO | Admitting: Orthopedic Surgery

## 2018-02-15 DIAGNOSIS — M79672 Pain in left foot: Secondary | ICD-10-CM | POA: Diagnosis not present

## 2018-02-15 NOTE — Progress Notes (Signed)
Office Visit Note   Patient: Tom White           Date of Birth: 11/02/1960           MRN: [id number] Visit Date: 02/15/2018              Requested by: Chesley Noon, MD [address] [place], [place] [address] PCP: Chesley Noon, MD  Chief Complaint  Patient presents with  . Left Ankle - Follow-up      HPI: Patient is a 57 year old gentleman who presents in follow-up status post revision subtalar fusion with posterior arthroscopic subtalar arthrodesis.  Patient states the ankle is feeling better but he still has pain in the subtalar joint.  Assessment & Plan: Visit Diagnoses:  1. Foot pain, left     Plan: Recommended patient try some CBD lotion recommended [place] hiking sneakers for better support continue with a cane follow-up in 2 months with repeat three-view radiographs of the left foot.  Follow-Up Instructions: Return in about 2 months (around 04/17/2018).   Ortho Exam  Patient is alert, oriented, no adenopathy, well-dressed, normal affect, normal respiratory effort. Examination of the left foot he has good pulses he has good range of motion of the ankle without symptoms.  The ankle is nontender to palpation.  There is no pain with attempted motion of the subtalar joint.  The surgical incisions are well-healed there is no redness no swelling no sensitivity to light touch no skin color or temperature changes no dystrophic changes.  Imaging: No results found. No images are attached to the encounter.  Labs: Lab Results  Component Value Date   LABURIC 5.9 12/22/2017     Lab Results  Component Value Date   LABURIC 5.9 12/22/2017    There is no height or weight on file to calculate BMI.  Orders:  Orders Placed This Encounter  Procedures  . XR Foot Complete Left   No orders of the defined types were placed in this encounter.    Procedures: No procedures performed  Clinical Data: No additional findings.  ROS:  All other systems negative,  except as noted in the HPI. Review of Systems  Objective: Vital Signs: There were no vitals taken for this visit.  Specialty Comments:  No specialty comments available.  PMFS History: Patient Active Problem List   Diagnosis Date Noted  . Pain in left ankle and joints of left foot 06/17/2017  . Foot laceration 05/03/2017  . 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 Inflammatory heel pain, right 01/21/2017   Past Medical History:  Diagnosis Date  . Anxiety   . Arthritis    "had it in my left ankle" (05/04/2017)  . Depression   . GERD (gastroesophageal reflux disease)   . Pneumonia 2017    History reviewed. No pertinent family history.  Past Surgical History:  Procedure Laterality Date  . ANKLE ARTHROSCOPY Left ~ 2013   "scraped out arthritis"  . ANKLE ARTHROSCOPY WITH FUSION Left 09/22/2017   Procedure: LEFT ANKLE POSTERIOR ARTHROSCOPIC SUBTALAR ARTHRODESIS;  Surgeon: Newt Minion, MD;  Location: [facility];  Service: Orthopedics;  Laterality: Left;  . ANKLE FRACTURE SURGERY Left 12/1980  . APPLICATION OF WOUND VAC Right 05/03/2017   foot  . APPLICATION OF WOUND VAC Right 05/03/2017   Procedure: APPLICATION OF WOUND VAC;  Surgeon: Marybelle Killings, MD;  Location: [facility];  Service: Orthopedics;  Laterality: Right;  . FRACTURE SURGERY    . I&D EXTREMITY Right 05/03/2017  foot  . I&D EXTREMITY Right 05/03/2017   Procedure: IRRIGATION AND DEBRIDEMENT EXTREMITY RIGHT, REPAIR OF POSTERIOR TIBIAL TENDON;  Surgeon: Marybelle Killings, MD;  Location: [facility];  Service: Orthopedics;  Laterality: Right;  . [place]?  Marland Kitchen POSTERIOR TIBIAL TENDON REPAIR Right 05/03/2017  . SUBTALAR JOINT ARTHROEREISIS Left 1985?  . TONSILLECTOMY     Social History   Occupational History  . Not on file  Tobacco Use  . Smoking status: Former Smoker    Packs/day: 1.00    Years: 15.00    Pack years: 15.00    Types: Cigarettes    Last attempt to quit: 1995    Years since quitting:  24.3  . Smokeless tobacco: Never Used  Substance and Sexual Activity  . Alcohol use: Yes    Comment: 05/04/2017 "recovering alcoholic since ~ 6384"  . Drug use: No  . Sexual activity: Not on file

## 2018-04-25 ENCOUNTER — Ambulatory Visit (INDEPENDENT_AMBULATORY_CARE_PROVIDER_SITE_OTHER): Payer: BC Managed Care – PPO

## 2018-04-25 ENCOUNTER — Ambulatory Visit (INDEPENDENT_AMBULATORY_CARE_PROVIDER_SITE_OTHER): Payer: BC Managed Care – PPO | Admitting: Orthopedic Surgery

## 2018-04-25 ENCOUNTER — Encounter (INDEPENDENT_AMBULATORY_CARE_PROVIDER_SITE_OTHER): Payer: Self-pay | Admitting: Orthopedic Surgery

## 2018-04-25 VITALS — Ht 69.0 in | Wt 180.0 lb

## 2018-04-25 DIAGNOSIS — M25872 Other specified joint disorders, left ankle and foot: Secondary | ICD-10-CM

## 2018-04-25 DIAGNOSIS — M79672 Pain in left foot: Secondary | ICD-10-CM

## 2018-04-25 NOTE — Progress Notes (Signed)
Office Visit Note   Patient: Tom White           Date of Birth: 1961-01-19           MRN: [id number] Visit Date: 04/25/2018              Requested by: Chesley Noon, MD [address] [place], [place] [address] PCP: Chesley Noon, MD  Chief Complaint  Patient presents with  . Left Foot - Pain      HPI: Patient is a 57 year old gentleman who was status post left subtalar fusion he has been having impingement symptoms as well he has pain with dorsiflexion of the ankle pain over the anterior lateral joint line.  He also has posterior tibial tendinitis.  He also states he has occasional pain over the staple over the sinus Tarsi.  Patient states that the had very minimal relief from the intra-articular injection of the left ankle.  Assessment & Plan: Visit Diagnoses:  1. Foot pain, left   2. Impingement syndrome of left ankle     Plan: We will set him up for physical therapy for strengthening.  Discussed options of using an ASO support the posterior tibial tendon.Discussed that if he does not get sufficient relief with therapy we could proceed with ankle arthroscopy for debridement of the impingement.  Orders placed for physical therapy including iontophoresis.  Recommended CBD lotion over the impingement symptoms of the ankle as well as over the patella tendinitis on the right.  Follow-Up Instructions: No follow-ups on file.   Ortho Exam  Patient is alert, oriented, no adenopathy, well-dressed, normal affect, normal respiratory effort.  On examination patient has good pulses he has a palpable staple over the sinus Tarsi which he states causes some clicking.  There is no redness no cellulitis.  Patient is tender to palpation of the anterior lateral joint line pain reproduced with dorsiflexion of the ankle.  Patient also has some tenderness to palpation over the posterior tibial tendon medial to the medial malleolus.  There is no tenderness proximally or tenderness at its  insertion.  There are no dystrophic changes no skin color or temperature changes.  Patient ambulates with a cane in the left hand to unload the left foot.  Patient has no crepitation with range of motion of the right knee there is no effusion he has tenderness to palpation at the origin of the patella tendon on the right.  There are no nodular changes no weakness no ligamentous instability. Imaging: No results found. No images are attached to the encounter.  Labs: Lab Results  Component Value Date   LABURIC 5.9 12/22/2017     Lab Results  Component Value Date   LABURIC 5.9 12/22/2017    Body mass index is 26.58 kg/m.  Orders:  Orders Placed This Encounter  Procedures  . XR Foot Complete Left   No orders of the defined types were placed in this encounter.    Procedures: No procedures performed  Clinical Data: No additional findings.  ROS:  All other systems negative, except as noted in the HPI. Review of Systems  Objective: Vital Signs: Ht 5\' 9"  (1.753 m)   Wt 180 lb (81.6 kg)   BMI 26.58 kg/m   Specialty Comments:  No specialty comments available.  PMFS History: Patient Active Problem List   Diagnosis Date Noted  . Pain in left ankle and joints of left foot 06/17/2017  . Foot laceration 05/03/2017  . Fibrosis of subtalar joint,  left 01/21/2017  . Inflammatory heel pain, right 01/21/2017   Past Medical History:  Diagnosis Date  . Anxiety   . Arthritis    "had it in my left ankle" (05/04/2017)  . Depression   . GERD (gastroesophageal reflux disease)   . Pneumonia 2017    History reviewed. No pertinent family history.  Past Surgical History:  Procedure Laterality Date  . ANKLE ARTHROSCOPY Left ~ 2013   "scraped out arthritis"  . ANKLE ARTHROSCOPY WITH FUSION Left 09/22/2017   Procedure: LEFT ANKLE POSTERIOR ARTHROSCOPIC SUBTALAR ARTHRODESIS;  Surgeon: Newt Minion, MD;  Location: [facility];  Service: Orthopedics;  Laterality: Left;  . ANKLE FRACTURE  SURGERY Left 12/1980  . APPLICATION OF WOUND VAC Right 05/03/2017   foot  . APPLICATION OF WOUND VAC Right 05/03/2017   Procedure: APPLICATION OF WOUND VAC;  Surgeon: Marybelle Killings, MD;  Location: [facility];  Service: Orthopedics;  Laterality: Right;  . FRACTURE SURGERY    . I&D EXTREMITY Right 05/03/2017   foot  . I&D EXTREMITY Right 05/03/2017   Procedure: IRRIGATION AND DEBRIDEMENT EXTREMITY RIGHT, REPAIR OF POSTERIOR TIBIAL TENDON;  Surgeon: Marybelle Killings, MD;  Location: [facility];  Service: Orthopedics;  Laterality: Right;  . [place]?  Marland Kitchen POSTERIOR TIBIAL TENDON REPAIR Right 05/03/2017  . SUBTALAR JOINT ARTHROEREISIS Left 1985?  . TONSILLECTOMY     Social History   Occupational History  . Not on file  Tobacco Use  . Smoking status: Former Smoker    Packs/day: 1.00    Years: 15.00    Pack years: 15.00    Types: Cigarettes    Last attempt to quit: 1995    Years since quitting: 24.5  . Smokeless tobacco: Never Used  Substance and Sexual Activity  . Alcohol use: Yes    Comment: 05/04/2017 "recovering alcoholic since ~ 3734"  . Drug use: No  . Sexual activity: Not on file

## 2018-04-27 ENCOUNTER — Telehealth (INDEPENDENT_AMBULATORY_CARE_PROVIDER_SITE_OTHER): Payer: Self-pay | Admitting: Orthopedic Surgery

## 2018-04-27 NOTE — Telephone Encounter (Signed)
Patient called advised he also needed his x-rays of his left foot on a CD. The number to contact patient is [phone]

## 2018-04-28 NOTE — Telephone Encounter (Signed)
This is for an Xray CD.  Thanks!

## 2018-04-29 NOTE — Telephone Encounter (Signed)
Done. Patient aware CD is ready for pickup

## 2018-05-03 ENCOUNTER — Telehealth (INDEPENDENT_AMBULATORY_CARE_PROVIDER_SITE_OTHER): Payer: Self-pay | Admitting: Orthopedic Surgery

## 2018-05-03 ENCOUNTER — Other Ambulatory Visit: Payer: Self-pay

## 2018-05-03 ENCOUNTER — Ambulatory Visit: Payer: BC Managed Care – PPO | Attending: Orthopedic Surgery | Admitting: Physical Therapy

## 2018-05-03 DIAGNOSIS — M25672 Stiffness of left ankle, not elsewhere classified: Secondary | ICD-10-CM | POA: Insufficient documentation

## 2018-05-03 DIAGNOSIS — M25572 Pain in left ankle and joints of left foot: Secondary | ICD-10-CM | POA: Diagnosis present

## 2018-05-03 DIAGNOSIS — R2689 Other abnormalities of gait and mobility: Secondary | ICD-10-CM | POA: Insufficient documentation

## 2018-05-03 DIAGNOSIS — M6281 Muscle weakness (generalized): Secondary | ICD-10-CM | POA: Insufficient documentation

## 2018-05-03 NOTE — Telephone Encounter (Signed)
Patient's wife Juliann Pulse request a new order for physical therapy be sent to [facility]. Pt went to PT today at [facility] and do not wish to go back. Please call Juliann Pulse @ [phone]

## 2018-05-03 NOTE — Addendum Note (Signed)
Addended bySherol Dade on: 05/03/2018 04:29 PM   Modules accepted: Orders

## 2018-05-03 NOTE — Telephone Encounter (Signed)
Patient called needing all images dating back to 2012 to present. The number to contact patient is [phone]

## 2018-05-03 NOTE — Therapy (Addendum)
[facility] [address] [place], [place], [address] Phone: [phone]   Fax:  [phone]  Physical Therapy Evaluation/Discharge  Patient Details  Name: Tom White MRN: [id number] Date of Birth: [birth year] Referring Provider: Meridee Score, MD    Encounter Date: 05/03/2018  PT End of Session - 05/03/18 1615    Visit Number  1    Date for PT Re-Evaluation  07/04/18    Authorization Type  [employer]    Authorization Time Period  05/03/18 to 07/04/18    PT Start Time  1447    PT Stop Time  1530    PT Time Calculation (min)  43 min    Activity Tolerance  Patient tolerated treatment well;No increased pain    Behavior During Therapy  WFL for tasks assessed/performed       Past Medical History:  Diagnosis Date  . Anxiety   . Arthritis    "had it in my left ankle" (05/04/2017)  . Depression   . GERD (gastroesophageal reflux disease)   . Pneumonia 2017    Past Surgical History:  Procedure Laterality Date  . ANKLE ARTHROSCOPY Left ~ 2013   "scraped out arthritis"  . ANKLE ARTHROSCOPY WITH FUSION Left 09/22/2017   Procedure: LEFT ANKLE POSTERIOR ARTHROSCOPIC SUBTALAR ARTHRODESIS;  Surgeon: Newt Minion, MD;  Location: [facility];  Service: Orthopedics;  Laterality: Left;  . ANKLE FRACTURE SURGERY Left 12/1980  . APPLICATION OF WOUND VAC Right 05/03/2017   foot  . APPLICATION OF WOUND VAC Right 05/03/2017   Procedure: APPLICATION OF WOUND VAC;  Surgeon: Marybelle Killings, MD;  Location: [facility];  Service: Orthopedics;  Laterality: Right;  . FRACTURE SURGERY    . I&D EXTREMITY Right 05/03/2017   foot  . I&D EXTREMITY Right 05/03/2017   Procedure: IRRIGATION AND DEBRIDEMENT EXTREMITY RIGHT, REPAIR OF POSTERIOR TIBIAL TENDON;  Surgeon: Marybelle Killings, MD;  Location: [facility];  Service: Orthopedics;  Laterality: Right;  . [place]?  Marland Kitchen POSTERIOR TIBIAL TENDON REPAIR Right 05/03/2017  . SUBTALAR JOINT ARTHROEREISIS  Left 1985?  . TONSILLECTOMY      There were no vitals filed for this visit.   Subjective Assessment - 05/03/18 1456    Subjective  Pt reports that he has had atleast 4 or more surgeries on his Lt ankle. The first surgery was back in March of 1982, and was fused in 1985. Since then, he has had debridements and had a revision of his subtalar fusion on Sep 22, 2017. He is hoping that he can avoid another surgery. He is currently trying to walk in the pool and use his recumbent bike.     Pertinent History  anxiety, depression, Lt ankle fusion (revision 2018)    Limitations  Walking    Patient Stated Goals  improve pain and ankle ROM    Currently in Pain?  No/denies    Pain Location  Ankle    Pain Orientation  Left;Lateral;Medial    Pain Descriptors / Indicators  Sharp    Pain Type  Chronic pain    Pain Radiating Towards  none     Pain Onset  More than a month ago    Pain Frequency  Intermittent    Aggravating Factors   walking, squatting, any weighted activity    Pain Relieving Factors  pain medication and ice         [facility] PT Assessment - 05/03/18 0001      Assessment  Medical Diagnosis  impingement syndrome of Lt ankle     Referring Provider  Meridee Score, MD     Prior Therapy  none       Precautions   Precautions  None      Restrictions   Weight Bearing Restrictions  No      Balance Screen   Has the patient fallen in the past 6 months  No    Has the patient had a decrease in activity level because of a fear of falling?   No    Is the patient reluctant to leave their home because of a fear of falling?   No      Home Environment   Additional Comments  stairs into the house and full flight in the home      Prior Function   Leisure  enjoys riding his bike, golfing       Cognition   Overall Cognitive Status  Within Functional Limits for tasks assessed      Observation/Other Assessments   Observations  Pt wearing ACE bandage    Focus on Therapeutic Outcomes (FOTO)    59% limited      ROM / Strength   AROM / PROM / Strength  AROM;PROM;Strength      AROM   AROM Assessment Site  Ankle    Right/Left Ankle  Right;Left    Right Ankle Dorsiflexion  10    Right Ankle Plantar Flexion  60    Left Ankle Dorsiflexion  5    Left Ankle Plantar Flexion  40    Left Ankle Inversion  5    Left Ankle Eversion  0      PROM   PROM Assessment Site  Ankle    Right/Left Ankle  Right;Left    Left Ankle Dorsiflexion  5    Left Ankle Plantar Flexion  40      Strength   Strength Assessment Site  Hip;Knee;Ankle    Right/Left Hip  Right;Left    Right/Left Knee  Right;Left    Right/Left Ankle  Right;Left    Right Ankle Plantar Flexion  -- 20 single leg heel raises     Left Ankle Dorsiflexion  5/5    Left Ankle Plantar Flexion  -- unable to complete single leg heel raise     Left Ankle Inversion  4/5    Left Ankle Eversion  4/5      Palpation   Palpation comment  tenderness over Lt sinus tarsi,       Transfers   Five time sit to stand comments   13 sec, no UE      Ambulation/Gait   Gait Comments  decreased stance on the Lt, decreased heel strike and pushoff on the Lt, increased weight through [facility]      High Level Balance   High Level Balance Comments  SLS Rt: 10 sec, Lt 2 sec                 Objective measurements completed on examination: See above findings.      [facility] Adult PT Treatment/Exercise - 05/03/18 0001      Exercises   Exercises  Ankle      Ankle Exercises: Stretches   Gastroc Stretch  1 rep;30 seconds    Gastroc Stretch Limitations  standing    Other Stretch  Lt ankle PF stretch 3x30 sec      Ankle Exercises: Seated   Other Seated Ankle Exercises  Lt great to flexion  into floor attempted x5 reps, Lt great toe extension x5 reps             PT Education - 05/03/18 1614    Education Details  eval findings/POC; HEP implemented and reviewed; importance of proper ankle position with functional activity     Person(s) Educated   Patient    Methods  Explanation;Verbal cues;Handout    Comprehension  Returned demonstration;Verbalized understanding       PT Short Term Goals - 05/03/18 1622      PT SHORT TERM GOAL #1   Title  Pt will demo consistency and independence with his HEP to increase   ankle ROM and strength.    Time  3    Period  Weeks    Status  New    Target Date  05/24/18      PT SHORT TERM GOAL #2   Title  Pt will have atleast 10 deg of active Lt DF ROM to assist with foot clearance during ambulation.    Time  4    Period  Weeks    Status  New    Target Date  06/03/18      PT SHORT TERM GOAL #3   Title  Pt will have atleast 50 deg of Lt active PF ROM to assist with push off during ambulation.    Time  4    Period  Weeks    Status  New      PT SHORT TERM GOAL #4   Title  Pt will ambulate with proper heel/toe sequencing with SPC atleast 138f on solid surface to improve his efficiency with gait.    Time  4    Period  Weeks    Status  New        PT Long Term Goals - 05/03/18 1624      PT LONG TERM GOAL #1   Title  Pt will ambulate with proper heel/toe sequencing without an AD atleast 1024fon solid surface to improve his efficiency with gait.    Time  8    Period  Weeks    Status  New    Target Date  07/04/18      PT LONG TERM GOAL #2   Title  Pt will demo improved ankle proprioception and endurance evident by his ability to maintain SLS on the Lt for atleast 10 sec on unstable surface without LOB, 2/3 trials.     Time  8    Period  Weeks    Status  New      PT LONG TERM GOAL #3   Title  Pt will demo improve gastroc strength, evident by his ability to complete atelast 20 single leg heel raises without difficulty.     Time  8    Period  Weeks    Status  New      PT LONG TERM GOAL #4   Title  Pt will report atleast 50% decrease in ankle pain from the start of PT to improve his quality of life.    Time  8    Period  Weeks    Status  New      PT LONG TERM GOAL #5   Title  Pt  will be able to ascend and descent atleast 4 steps with reciprocal pattern and using no more than 1 handrail for stability, x3 trials.     Time  8    Period  Weeks    Status  New  Plan - 05/03/18 1616    Clinical Impression Statement  Pt is a pleasant 57 y.o M referred to OPPT with history of chronic Lt ankle pain following several debridement's and fusions over the past 20 years. He underwent a subtalar fusion revision back in May, but continues to have pain with daily activity such as walking and squatting. He presents with limited ankle active and passive ROM on the Lt, with no more than 5 deg of dorsiflexion. He has weakness of the ankle musculature, specifically of the plantar flexors and his proprioception is impaired, evident by unsteadiness with attempts at single leg stance. He would benefit from skilled PT to address his limitations in ROM, strength and proprioception to decrease pain and allow him to participate in daily and leisure activity without difficulty.     Clinical Presentation  Evolving    Clinical Presentation due to:  worsening over 20 years     Rehab Potential  Good    PT Frequency  2x / week    PT Duration  8 weeks    PT Treatment/Interventions  ADLs/Self Care Home Management;Moist Heat;Iontophoresis 24m/ml Dexamethasone;Electrical Stimulation;Cryotherapy;Therapeutic activities;Therapeutic exercise;Patient/family education;Neuromuscular re-education;Balance training;Stair training;Manual techniques;Passive range of motion;Dry needling;Taping    PT Next Visit Plan  assess stairs, ankle mobilizations for DF/PF; ankle proprioception activity     PT Home Exercise Plan  [id number]    Consulted and Agree with Plan of Care  Patient       Patient will benefit from skilled therapeutic intervention in order to improve the following deficits and impairments:  Abnormal gait, Decreased activity tolerance, Decreased balance, Decreased strength, Impaired flexibility,  Postural dysfunction, Pain, Improper body mechanics, Decreased range of motion, Decreased mobility, Decreased endurance, Difficulty walking, Hypomobility, Increased muscle spasms  Visit Diagnosis: Stiffness of left ankle, not elsewhere classified  Pain in left ankle and joints of left foot  Other abnormalities of gait and mobility  Muscle weakness (generalized)     Problem List Patient Active Problem List   Diagnosis Date Noted  . Pain in left ankle and joints of left foot 06/17/2017  . Foot laceration 05/03/2017  . 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 Inflammatory heel pain, right 01/21/2017    4:27 PM,05/03/18 SSherol DadePT, DPT [facility] [address] [place] [address]Phone: [phone]  Fax:  [phone] Name: JDaylon LafavorMRN: [id number] of Birth: [birth year]  *addendum to resolve episode of care and d/c pt from PT services.  PHYSICAL THERAPY DISCHARGE SUMMARY  Visits from Start of Care: 1  Current functional level related to goals / functional outcomes: See above for more details    Remaining deficits: See above for more details    Education / Equipment: See above for more details  Plan: Patient agrees to discharge.  Patient goals were not met. Patient is being discharged due to the patient's request.  ?????     Pt will be transferring care to another clinic.  2:37 PM,05/05/18 SSherol DadePT, DElbeat BLenox

## 2018-05-04 NOTE — Telephone Encounter (Signed)
Called to inform patient CD has been remade with ALL images of left foot and ankle from 2012 to 2019. CD will be placed up front for pick up.

## 2018-05-04 NOTE — Telephone Encounter (Signed)
Done

## 2018-05-05 ENCOUNTER — Other Ambulatory Visit (INDEPENDENT_AMBULATORY_CARE_PROVIDER_SITE_OTHER): Payer: Self-pay

## 2018-05-05 ENCOUNTER — Telehealth (INDEPENDENT_AMBULATORY_CARE_PROVIDER_SITE_OTHER): Payer: Self-pay | Admitting: Orthopedic Surgery

## 2018-05-05 DIAGNOSIS — M25872 Other specified joint disorders, left ankle and foot: Secondary | ICD-10-CM

## 2018-05-05 NOTE — Telephone Encounter (Signed)
I called pt and he states that this was not sent to [facility] but a [facility]? Can we send referral to [facility] on [address]?

## 2018-05-05 NOTE — Telephone Encounter (Signed)
Patient came in stating that an order was written for PT at a facility that charger twice as much as copay. They charge OP rates.  Patient states he wants order written to [facility] instead.  Please call patient to advise.

## 2018-05-05 NOTE — Telephone Encounter (Signed)
Pt called and left vm requesting to send PT referral to [facility] for physical therapy. I faxed all pertinent information to [facility] and they will call pt to call pt.

## 2018-05-05 NOTE — Telephone Encounter (Signed)
Order was faxed this am to [facility] at [phone]

## 2018-05-05 NOTE — Telephone Encounter (Signed)
Thank you so much I had just put in the referral and got the fax number to send it! I appreciate it!

## 2018-05-09 ENCOUNTER — Encounter: Payer: BC Managed Care – PPO | Admitting: Physical Therapy

## 2018-05-11 ENCOUNTER — Encounter: Payer: BC Managed Care – PPO | Admitting: Physical Therapy

## 2018-05-16 ENCOUNTER — Encounter: Payer: BC Managed Care – PPO | Admitting: Physical Therapy

## 2018-05-18 ENCOUNTER — Encounter: Payer: BC Managed Care – PPO | Admitting: Physical Therapy

## 2018-05-18 ENCOUNTER — Telehealth (INDEPENDENT_AMBULATORY_CARE_PROVIDER_SITE_OTHER): Payer: Self-pay | Admitting: Orthopedic Surgery

## 2018-05-18 NOTE — Telephone Encounter (Signed)
Patient called advised he went to [facility] for a left ankle brace and was told he needed a Rx for that. Patient asked if a Rx could be faxed over to them. The  Number to contact patient is [phone]

## 2018-05-19 NOTE — Telephone Encounter (Signed)
IC patient and advised, he says he has tried those and they do not help, I have faxed Rx to [facility].

## 2018-05-19 NOTE — Telephone Encounter (Signed)
Yes, he should try an ASO recommend just get at medical supply store, most likely cheaper there

## 2018-05-19 NOTE — Telephone Encounter (Signed)
I can fax Rx, please clarify if Rx should be for ASO or other ankle brace? Thanks.

## 2018-05-23 ENCOUNTER — Encounter: Payer: BC Managed Care – PPO | Admitting: Physical Therapy

## 2018-05-25 ENCOUNTER — Encounter: Payer: BC Managed Care – PPO | Admitting: Physical Therapy

## 2018-05-30 ENCOUNTER — Encounter: Payer: BC Managed Care – PPO | Admitting: Physical Therapy

## 2018-06-01 ENCOUNTER — Encounter: Payer: BC Managed Care – PPO | Admitting: Physical Therapy

## 2018-06-07 ENCOUNTER — Encounter: Payer: BC Managed Care – PPO | Admitting: Physical Therapy

## 2018-06-09 ENCOUNTER — Encounter: Payer: BC Managed Care – PPO | Admitting: Physical Therapy

## 2018-06-09 ENCOUNTER — Ambulatory Visit (INDEPENDENT_AMBULATORY_CARE_PROVIDER_SITE_OTHER): Payer: BC Managed Care – PPO | Admitting: Orthopedic Surgery

## 2018-06-13 ENCOUNTER — Encounter: Payer: BC Managed Care – PPO | Admitting: Physical Therapy

## 2018-06-15 ENCOUNTER — Encounter: Payer: BC Managed Care – PPO | Admitting: Physical Therapy

## 2018-06-16 ENCOUNTER — Encounter (INDEPENDENT_AMBULATORY_CARE_PROVIDER_SITE_OTHER): Payer: Self-pay | Admitting: Orthopedic Surgery

## 2018-06-16 ENCOUNTER — Ambulatory Visit (INDEPENDENT_AMBULATORY_CARE_PROVIDER_SITE_OTHER): Payer: BC Managed Care – PPO | Admitting: Orthopedic Surgery

## 2018-06-16 VITALS — Ht 69.0 in | Wt 180.0 lb

## 2018-06-16 DIAGNOSIS — M25872 Other specified joint disorders, left ankle and foot: Secondary | ICD-10-CM | POA: Diagnosis not present

## 2018-06-16 DIAGNOSIS — M25572 Pain in left ankle and joints of left foot: Secondary | ICD-10-CM

## 2018-06-16 NOTE — Progress Notes (Signed)
Office Visit Note   Patient: Tom White           Date of Birth: August 02, 1961           MRN: [id number] Visit Date: 06/16/2018              Requested by: Chesley Noon, MD [address] [place], [place] [address] PCP: Chesley Noon, MD  Chief Complaint  Patient presents with  . Left Foot - Follow-up, Pain      HPI: Patient presents in follow-up for his left foot and ankle.  He states his symptoms are getting better since he started with physical therapy.  Patient states that he started some new anxiety medication.  Assessment & Plan: Visit Diagnoses:  1. Impingement syndrome of left ankle   2. Pain in left ankle and joints of left foot     Plan: Discussed that I will review his physical therapy notes when they arrived.  Patient will continue with his physical therapy with increasing his activities as tolerated.  Recommended a stiff soled shoe to unload pressure on the plantar fascia recommended the over-the-counter orthotics continue with his cane.  He has an [facility] and he will continue using this.  Follow-Up Instructions: Return in about 4 weeks (around 07/14/2018).   Ortho Exam  Patient is alert, oriented, no adenopathy, well-dressed, normal affect, normal respiratory effort. Examination patient's foot is plantigrade there is no redness no cellulitis no swelling no signs of infection the subtalar joint is stable and no pain with attempted range of motion.  Imaging: No results found. No images are attached to the encounter.  Labs: Lab Results  Component Value Date   LABURIC 5.9 12/22/2017     Lab Results  Component Value Date   LABURIC 5.9 12/22/2017    Body mass index is 26.58 kg/m.  Orders:  No orders of the defined types were placed in this encounter.  No orders of the defined types were placed in this encounter.    Procedures: No procedures performed  Clinical Data: No additional findings.  ROS:  All other systems  negative, except as noted in the HPI. Review of Systems  Objective: Vital Signs: Ht 5\' 9"  (1.753 m)   Wt 180 lb (81.6 kg)   BMI 26.58 kg/m   Specialty Comments:  No specialty comments available.  PMFS History: Patient Active Problem List   Diagnosis Date Noted  . Pain in left ankle and joints of left foot 06/17/2017  . Foot laceration 05/03/2017  . 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 Inflammatory heel pain, right 01/21/2017   Past Medical History:  Diagnosis Date  . Anxiety   . Arthritis    "had it in my left ankle" (05/04/2017)  . Depression   . GERD (gastroesophageal reflux disease)   . Pneumonia 2017    History reviewed. No pertinent family history.  Past Surgical History:  Procedure Laterality Date  . ANKLE ARTHROSCOPY Left ~ 2013   "scraped out arthritis"  . ANKLE ARTHROSCOPY WITH FUSION Left 09/22/2017   Procedure: LEFT ANKLE POSTERIOR ARTHROSCOPIC SUBTALAR ARTHRODESIS;  Surgeon: Newt Minion, MD;  Location: [facility];  Service: Orthopedics;  Laterality: Left;  . ANKLE FRACTURE SURGERY Left 12/1980  . APPLICATION OF WOUND VAC Right 05/03/2017   foot  . APPLICATION OF WOUND VAC Right 05/03/2017   Procedure: APPLICATION OF WOUND VAC;  Surgeon: Marybelle Killings, MD;  Location: [facility];  Service: Orthopedics;  Laterality: Right;  . FRACTURE SURGERY    . I&D EXTREMITY Right 05/03/2017   foot  . I&D EXTREMITY Right 05/03/2017   Procedure: IRRIGATION AND DEBRIDEMENT EXTREMITY RIGHT, REPAIR OF POSTERIOR TIBIAL TENDON;  Surgeon: Marybelle Killings, MD;  Location: [facility];  Service: Orthopedics;  Laterality: Right;  . [place]?  Marland Kitchen POSTERIOR TIBIAL TENDON REPAIR Right 05/03/2017  . SUBTALAR JOINT ARTHROEREISIS Left 1985?  . TONSILLECTOMY     Social History   Occupational History  . Not on file  Tobacco Use  . Smoking status: Former Smoker    Packs/day: 1.00    Years: 15.00    Pack years: 15.00    Types: Cigarettes    Last attempt to quit:  1995    Years since quitting: 24.7  . Smokeless tobacco: Never Used  Substance and Sexual Activity  . Alcohol use: Yes    Comment: 05/04/2017 "recovering alcoholic since ~ 2072"  . Drug use: No  . Sexual activity: Not on file

## 2018-06-20 ENCOUNTER — Encounter: Payer: BC Managed Care – PPO | Admitting: Physical Therapy

## 2018-06-22 ENCOUNTER — Encounter: Payer: BC Managed Care – PPO | Admitting: Physical Therapy

## 2018-06-27 ENCOUNTER — Encounter: Payer: BC Managed Care – PPO | Admitting: Physical Therapy

## 2018-06-29 ENCOUNTER — Encounter: Payer: BC Managed Care – PPO | Admitting: Physical Therapy

## 2018-07-04 ENCOUNTER — Encounter: Payer: BC Managed Care – PPO | Admitting: Physical Therapy

## 2018-07-06 ENCOUNTER — Encounter: Payer: BC Managed Care – PPO | Admitting: Physical Therapy

## 2018-07-18 ENCOUNTER — Encounter (INDEPENDENT_AMBULATORY_CARE_PROVIDER_SITE_OTHER): Payer: Self-pay | Admitting: Orthopedic Surgery

## 2018-07-18 ENCOUNTER — Ambulatory Visit (INDEPENDENT_AMBULATORY_CARE_PROVIDER_SITE_OTHER): Payer: BC Managed Care – PPO | Admitting: Orthopedic Surgery

## 2018-07-18 VITALS — Ht 69.0 in | Wt 180.0 lb

## 2018-07-18 DIAGNOSIS — M25872 Other specified joint disorders, left ankle and foot: Secondary | ICD-10-CM

## 2018-07-18 DIAGNOSIS — M25572 Pain in left ankle and joints of left foot: Secondary | ICD-10-CM | POA: Diagnosis not present

## 2018-07-18 DIAGNOSIS — M24672 Ankylosis, left ankle: Secondary | ICD-10-CM | POA: Diagnosis not present

## 2018-07-18 MED ORDER — LIDOCAINE HCL 1 % IJ SOLN
2.0000 mL | INTRAMUSCULAR | Status: AC | PRN
  Administered 2018-07-18: 2 mL

## 2018-07-18 MED ORDER — METHYLPREDNISOLONE ACETATE 40 MG/ML IJ SUSP
40.0000 mg | INTRAMUSCULAR | Status: AC | PRN
  Administered 2018-07-18: 40 mg via INTRA_ARTICULAR

## 2018-07-18 NOTE — Progress Notes (Signed)
Office Visit Note   Patient: Tom White           Date of Birth: [birth year]           MRN: [id number] Visit Date: 07/18/2018              Requested by: Chesley Noon, MD [address] [place], [place] [address] PCP: Chesley Noon, MD  Chief Complaint  Patient presents with  . Left Ankle - Follow-up      HPI: Patient is a 57 year old gentleman who presents in follow-up status post revision fusion subtalar joint.  Patient states his pain is primarily over the anterior lateral ankle joint line.  Patient states he used to be able to get around playing golf do activities and was not sore until the next day he states that now he is sore all the time.  He states he has crippling pain.  Patient has been going to physical therapy.  The therapy notes states they are making good progress improving strength and decreasing symptoms.  Patient is also status post a chainsaw injury for laceration to the foot ankle and tendon that was treated with Dr. Lorin Mercy.  Patient states he is also status post ankle arthroscopy about 5 years ago.  Assessment & Plan: Visit Diagnoses:  1. Pain in left ankle and joints of left foot   2. Impingement syndrome of left ankle   3. Fibrosis of subtalar joint, left     Plan: The ankle was injected.  We will see how this improves his symptoms from the impingement.  Patient will get a CT scan of the ankle to evaluate the subtalar fusion to ensure there is no subcondylar cysts or sclerosis.  Will reevaluate after the injection and after the CT scan to see what our next steps would be he will continue with therapy continue with his ankle stabilizing orthosis.  Continue with his over-the-counter orthotics which he states are working well.  Follow-Up Instructions: Return in about 1 week (around 07/25/2018).   Ortho Exam  Patient is alert, oriented, no adenopathy, well-dressed, normal affect, normal respiratory effort. Examination patient has no hypersensitivity to  light touch no dystrophic changes.  Patient is point tender to palpation over the anterior lateral joint line of the ankle.  The sinus Tarsi is not tender to palpation.  Patient has no pain with passive range of motion of the ankle.  The subtalar joint clinically is stable there is no pain with attempted motion across the subtalar joint.  Patient uses a cane for ambulation.  There is minimal swelling.  Imaging: No results found. No images are attached to the encounter.  Labs: Lab Results  Component Value Date   LABURIC 5.9 12/22/2017     Lab Results  Component Value Date   LABURIC 5.9 12/22/2017    Body mass index is 26.58 kg/m.  Orders:  Orders Placed This Encounter  Procedures  . CT ANKLE LEFT WO CONTRAST   No orders of the defined types were placed in this encounter.    Procedures: Medium Joint Inj: L ankle on 07/18/2018 8:56 AM Indications: pain and diagnostic evaluation Details: 22 G 1.5 in needle, anteromedial approach Medications: 2 mL lidocaine 1 %; 40 mg methylPREDNISolone acetate 40 MG/ML Outcome: tolerated well, no immediate complications Procedure, treatment alternatives, risks and benefits explained, specific risks discussed. Consent was given by the patient. Immediately prior to procedure a time out was called to verify the correct patient, procedure, equipment, support staff  and site/side marked as required. Patient was prepped and draped in the usual sterile fashion.      Clinical Data: No additional findings.  ROS:  All other systems negative, except as noted in the HPI. Review of Systems  Objective: Vital Signs: Ht 5\' 9"  (1.753 m)   Wt 180 lb (81.6 kg)   BMI 26.58 kg/m   Specialty Comments:  No specialty comments available.  PMFS History: Patient Active Problem List   Diagnosis Date Noted  . Pain in left ankle and joints of left foot 06/17/2017  . Foot laceration 05/03/2017  . 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 Inflammatory  heel pain, right 01/21/2017   Past Medical History:  Diagnosis Date  . Anxiety   . Arthritis    "had it in my left ankle" (05/04/2017)  . Depression   . GERD (gastroesophageal reflux disease)   . Pneumonia 2017    History reviewed. No pertinent family history.  Past Surgical History:  Procedure Laterality Date  . ANKLE ARTHROSCOPY Left ~ 2013   "scraped out arthritis"  . ANKLE ARTHROSCOPY WITH FUSION Left 09/22/2017   Procedure: LEFT ANKLE POSTERIOR ARTHROSCOPIC SUBTALAR ARTHRODESIS;  Surgeon: Newt Minion, MD;  Location: [facility];  Service: Orthopedics;  Laterality: Left;  . ANKLE FRACTURE SURGERY Left 12/1980  . APPLICATION OF WOUND VAC Right 05/03/2017   foot  . APPLICATION OF WOUND VAC Right 05/03/2017   Procedure: APPLICATION OF WOUND VAC;  Surgeon: Marybelle Killings, MD;  Location: [facility];  Service: Orthopedics;  Laterality: Right;  . FRACTURE SURGERY    . I&D EXTREMITY Right 05/03/2017   foot  . I&D EXTREMITY Right 05/03/2017   Procedure: IRRIGATION AND DEBRIDEMENT EXTREMITY RIGHT, REPAIR OF POSTERIOR TIBIAL TENDON;  Surgeon: Marybelle Killings, MD;  Location: [facility];  Service: Orthopedics;  Laterality: Right;  . [place]?  Marland Kitchen POSTERIOR TIBIAL TENDON REPAIR Right 05/03/2017  . SUBTALAR JOINT ARTHROEREISIS Left 1985?  . TONSILLECTOMY     Social History   Occupational History  . Not on file  Tobacco Use  . Smoking status: Former Smoker    Packs/day: 1.00    Years: 15.00    Pack years: 15.00    Types: Cigarettes    Last attempt to quit: 1995    Years since quitting: 24.8  . Smokeless tobacco: Never Used  Substance and Sexual Activity  . Alcohol use: Yes    Comment: 05/04/2017 "recovering alcoholic since ~ 5745"  . Drug use: No  . Sexual activity: Not on file

## 2018-07-28 ENCOUNTER — Ambulatory Visit
Admission: RE | Admit: 2018-07-28 | Discharge: 2018-07-28 | Disposition: A | Discharge status: Home / Self Care | Payer: BC Managed Care – PPO | Source: Ambulatory Visit | Attending: Orthopedic Surgery | Admitting: Orthopedic Surgery

## 2018-07-28 DIAGNOSIS — M25572 Pain in left ankle and joints of left foot: Secondary | ICD-10-CM

## 2018-08-01 ENCOUNTER — Ambulatory Visit (INDEPENDENT_AMBULATORY_CARE_PROVIDER_SITE_OTHER): Payer: BC Managed Care – PPO | Admitting: Orthopedic Surgery

## 2018-08-01 ENCOUNTER — Encounter (INDEPENDENT_AMBULATORY_CARE_PROVIDER_SITE_OTHER): Payer: Self-pay | Admitting: Orthopedic Surgery

## 2018-08-01 VITALS — Ht 69.0 in | Wt 180.0 lb

## 2018-08-01 DIAGNOSIS — M25872 Other specified joint disorders, left ankle and foot: Secondary | ICD-10-CM | POA: Diagnosis not present

## 2018-08-01 DIAGNOSIS — M24672 Ankylosis, left ankle: Secondary | ICD-10-CM | POA: Diagnosis not present

## 2018-08-01 NOTE — Progress Notes (Signed)
Office Visit Note   Patient: Tom White           Date of Birth: October 14, 1960           MRN: [id number] Visit Date: 08/01/2018              Requested by: Chesley Noon, MD [address] [place], [place] [address] PCP: Chesley Noon, MD  Chief Complaint  Patient presents with  . Left Ankle - Follow-up    CT left ankle review       HPI: Patient is a 57 year old gentleman who presents in follow-up status post revision subtalar fusion on the left with a posterior arthroscopic subtalar arthrodesis.  Initial CT scan showed some bridging cortical bone however we just repeated the CT scan and there is no cortical bone bridging the fusion site at this time.  Patient states that he just recently has started going to therapy and he states that his Achilles tendinitis and ankle pain is improving with therapy he states that his ankle symptoms have improved since the last injection.  Patient was having impingement symptoms in the ankle with pain anteriorly over the ankle joint.  Assessment & Plan: Visit Diagnoses:  1. Impingement syndrome of left ankle   2. Fibrosis of subtalar joint, left     Plan: Discussed options with the reversal of the fusion to a fibrous nonunion.  Discussed that we could use a cast that this may have a 25% chance of progressing to a stable fusion, discussed that we could try a bone stimulator which may also help a little, discussed the possibility of nutrition supplements, that this would also have a limited impact in the healing.  Discussed the best option would be to proceed with a revision fusion.  Follow-Up Instructions: Return in about 2 weeks (around 08/15/2018).   Ortho Exam  Patient is alert, oriented, no adenopathy, well-dressed, normal affect, normal respiratory effort. Examination patient has some pain over the Achilles as well as anterior of the ankle but this is improved.  We reviewed the findings of the CT scan which shows that the previously seen  small areas of bridging bone across the joint are no longer visualized the joints the base is open the 2 fusion screws are intact.  Imaging: No results found. No images are attached to the encounter.  Labs: Lab Results  Component Value Date   LABURIC 5.9 12/22/2017     Lab Results  Component Value Date   LABURIC 5.9 12/22/2017    Body mass index is 26.58 kg/m.  Orders:  No orders of the defined types were placed in this encounter.  No orders of the defined types were placed in this encounter.    Procedures: No procedures performed  Clinical Data: No additional findings.  ROS:  All other systems negative, except as noted in the HPI. Review of Systems  Objective: Vital Signs: Ht 5\' 9"  (1.753 m)   Wt 180 lb (81.6 kg)   BMI 26.58 kg/m   Specialty Comments:  No specialty comments available.  PMFS History: Patient Active Problem List   Diagnosis Date Noted  . Pain in left ankle and joints of left foot 06/17/2017  . Foot laceration 05/03/2017  . 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 Inflammatory heel pain, right 01/21/2017   Past Medical History:  Diagnosis Date  . Anxiety   . Arthritis    "had it in my left ankle" (05/04/2017)  . Depression   .  GERD (gastroesophageal reflux disease)   . Pneumonia 2017    History reviewed. No pertinent family history.  Past Surgical History:  Procedure Laterality Date  . ANKLE ARTHROSCOPY Left ~ 2013   "scraped out arthritis"  . ANKLE ARTHROSCOPY WITH FUSION Left 09/22/2017   Procedure: LEFT ANKLE POSTERIOR ARTHROSCOPIC SUBTALAR ARTHRODESIS;  Surgeon: Newt Minion, MD;  Location: [facility];  Service: Orthopedics;  Laterality: Left;  . ANKLE FRACTURE SURGERY Left 12/1980  . APPLICATION OF WOUND VAC Right 05/03/2017   foot  . APPLICATION OF WOUND VAC Right 05/03/2017   Procedure: APPLICATION OF WOUND VAC;  Surgeon: Marybelle Killings, MD;  Location: [facility];  Service: Orthopedics;  Laterality: Right;  . FRACTURE  SURGERY    . I&D EXTREMITY Right 05/03/2017   foot  . I&D EXTREMITY Right 05/03/2017   Procedure: IRRIGATION AND DEBRIDEMENT EXTREMITY RIGHT, REPAIR OF POSTERIOR TIBIAL TENDON;  Surgeon: Marybelle Killings, MD;  Location: [facility];  Service: Orthopedics;  Laterality: Right;  . [place]?  Marland Kitchen POSTERIOR TIBIAL TENDON REPAIR Right 05/03/2017  . SUBTALAR JOINT ARTHROEREISIS Left 1985?  . TONSILLECTOMY     Social History   Occupational History  . Not on file  Tobacco Use  . Smoking status: Former Smoker    Packs/day: 1.00    Years: 15.00    Pack years: 15.00    Types: Cigarettes    Last attempt to quit: 1995    Years since quitting: 24.8  . Smokeless tobacco: Never Used  Substance and Sexual Activity  . Alcohol use: Yes    Comment: 05/04/2017 "recovering alcoholic since ~ [age]"  . Drug use: No  . Sexual activity: Not on file

## 2018-08-05 ENCOUNTER — Telehealth (INDEPENDENT_AMBULATORY_CARE_PROVIDER_SITE_OTHER): Payer: Self-pay | Admitting: Orthopedic Surgery

## 2018-08-05 NOTE — Telephone Encounter (Signed)
Patient called advised he is going to hold off on getting anything done for her ankle for now. The number to contact patient is [phone]

## 2018-08-05 NOTE — Telephone Encounter (Signed)
FYI- see message below 

## 2018-08-18 ENCOUNTER — Ambulatory Visit (INDEPENDENT_AMBULATORY_CARE_PROVIDER_SITE_OTHER): Payer: BC Managed Care – PPO | Admitting: Orthopedic Surgery

## 2019-07-10 IMAGING — DX DG FOOT COMPLETE 3+V*L*
3 series · 3 of 3 positions shown · non-contrast
Comparison: 09/16/2015.

CLINICAL DATA: Injury.

EXAM:
LEFT FOOT - COMPLETE 3+ VIEW

[foot ap]
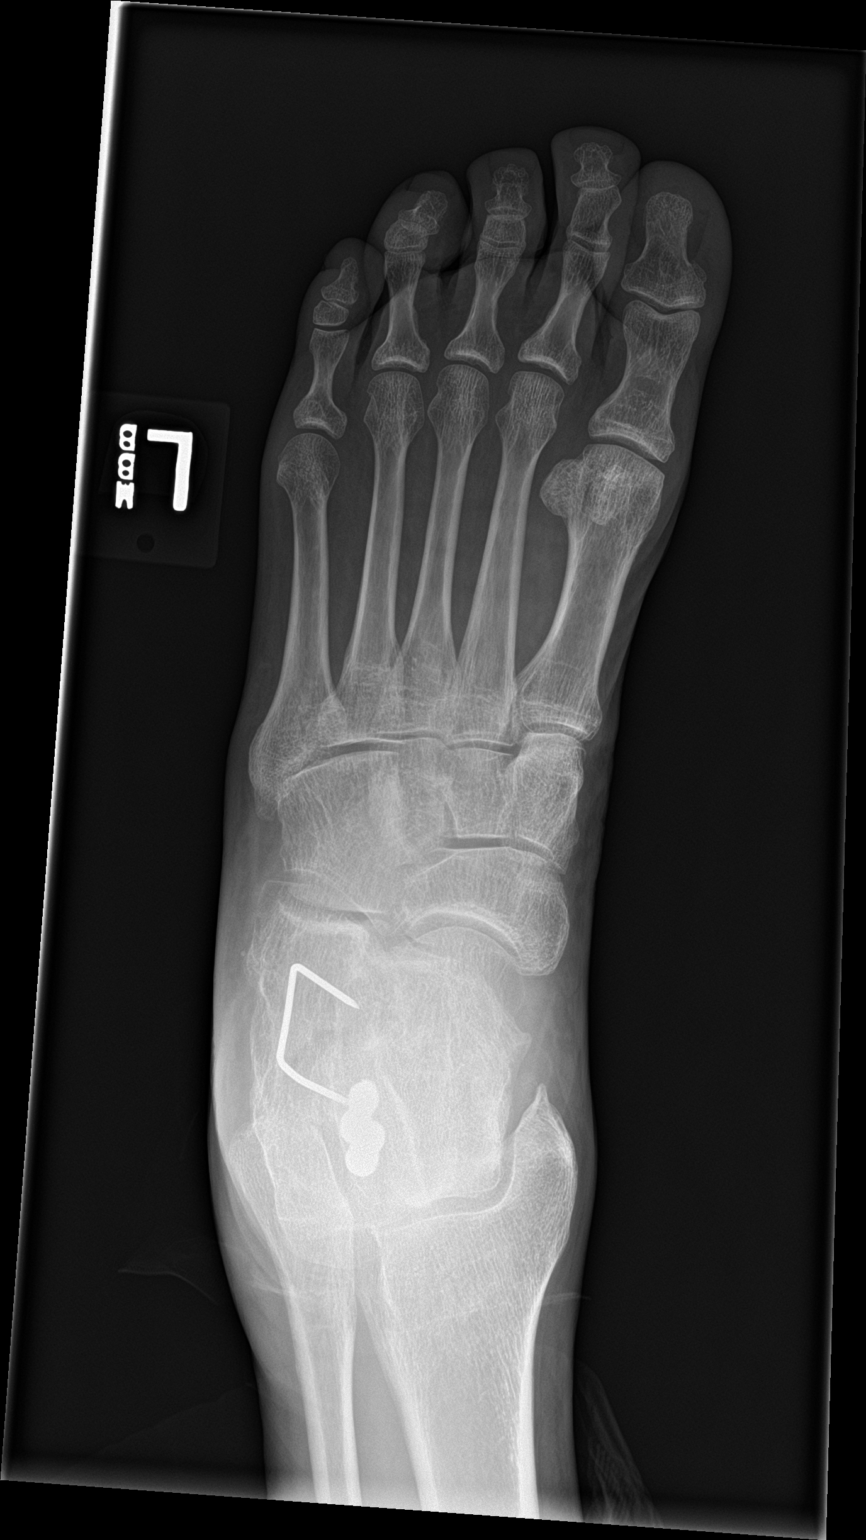

[foot obl]
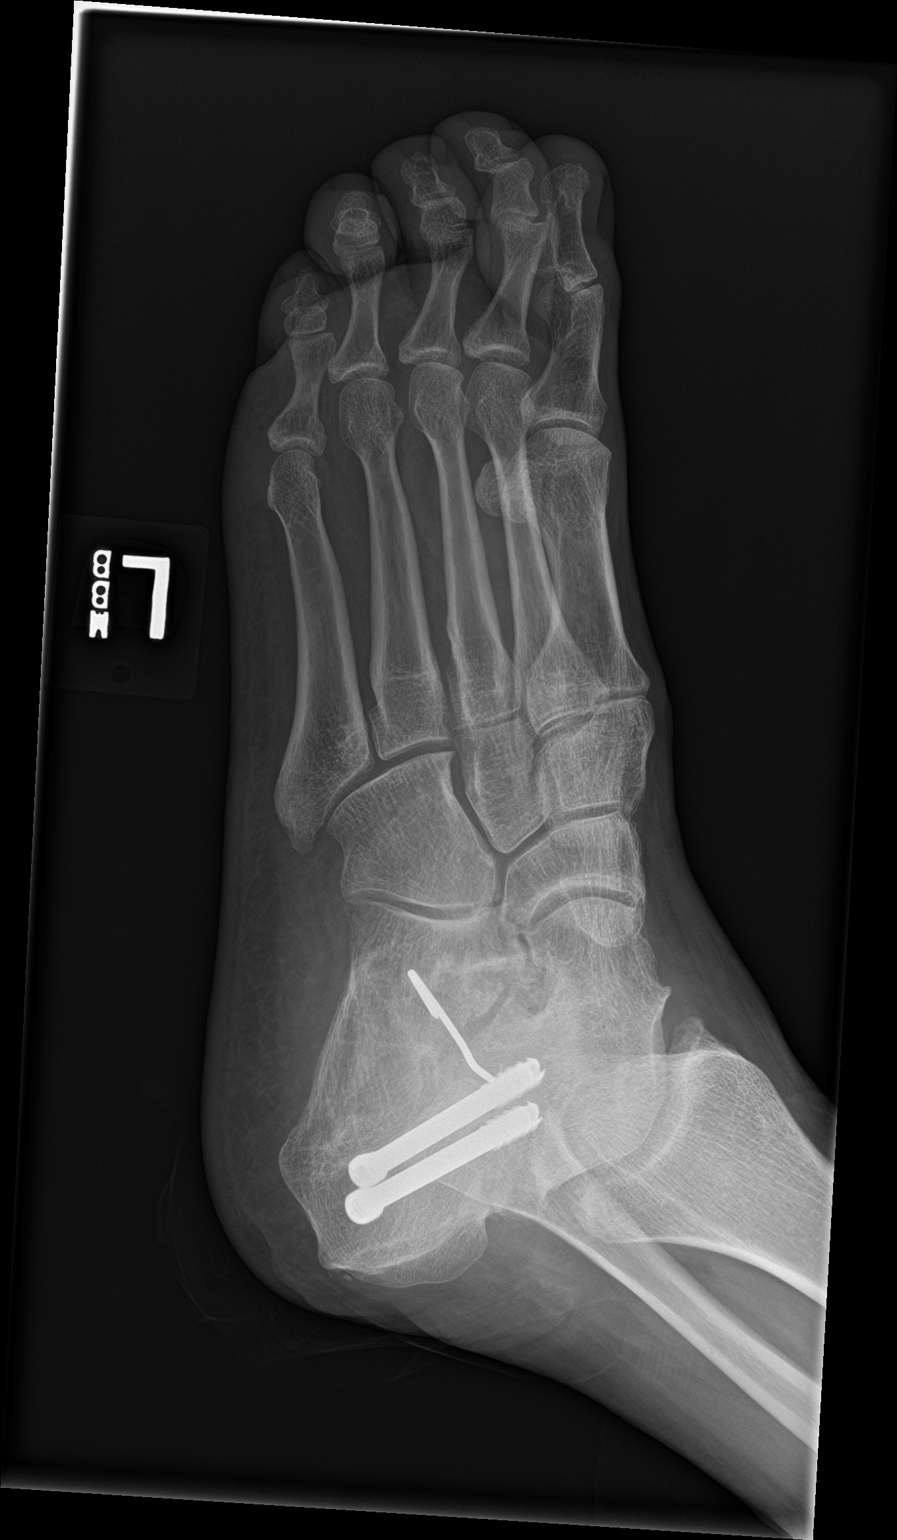

[foot lat]
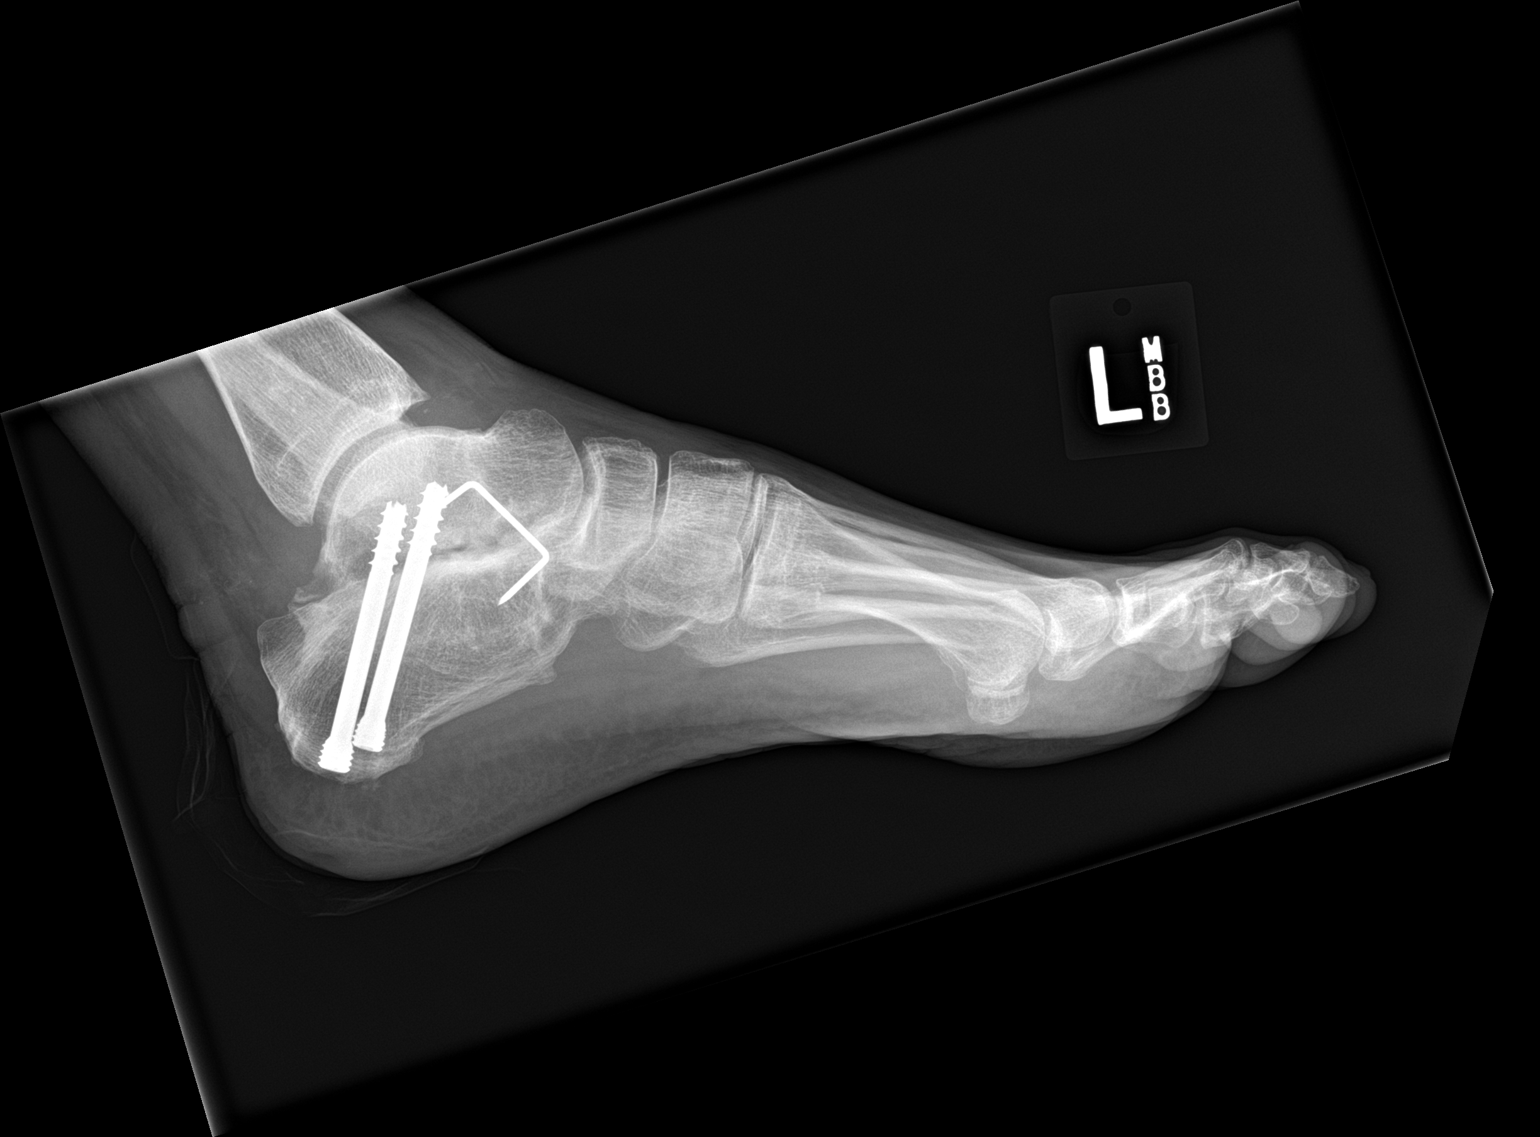

[3 of 3 positions shown; findings below may reference images not displayed]

FINDINGS: No acute bony or joint abnormality identified. No evidence of
fracture dislocation. Postsurgical changes talocalcaneal region.
Hardware intact. Anatomic alignment.
IMPRESSION: No acute abnormality identified. Postsurgical changes talocalcaneal
area. Hardware intact. Anatomic alignment.

## 2020-01-30 DIAGNOSIS — F4310 Post-traumatic stress disorder, unspecified: Secondary | ICD-10-CM | POA: Insufficient documentation

## 2020-01-30 HISTORY — DX: Post-traumatic stress disorder, unspecified: F43.10

## 2020-06-20 ENCOUNTER — Other Ambulatory Visit: Payer: Self-pay | Admitting: Physician Assistant

## 2020-06-20 ENCOUNTER — Ambulatory Visit
Admission: RE | Admit: 2020-06-20 | Discharge: 2020-06-20 | Disposition: A | Discharge status: Home / Self Care | Payer: Medicare HMO | Source: Ambulatory Visit | Attending: Physician Assistant | Admitting: Physician Assistant

## 2020-06-20 ENCOUNTER — Other Ambulatory Visit: Payer: Self-pay

## 2020-06-20 DIAGNOSIS — R1032 Left lower quadrant pain: Secondary | ICD-10-CM

## 2020-06-20 MED ORDER — IOPAMIDOL (ISOVUE-300) INJECTION 61%
100.0000 mL | Freq: Once | INTRAVENOUS | Status: AC | PRN
  Administered 2020-06-20: 100 mL via INTRAVENOUS

## 2021-04-02 DIAGNOSIS — K5901 Slow transit constipation: Secondary | ICD-10-CM | POA: Insufficient documentation

## 2021-04-02 HISTORY — DX: Slow transit constipation: K59.01

## 2021-04-16 DIAGNOSIS — C4359 Malignant melanoma of other part of trunk: Secondary | ICD-10-CM | POA: Insufficient documentation

## 2021-05-14 ENCOUNTER — Encounter: Payer: Self-pay | Admitting: Psychology

## 2021-09-11 ENCOUNTER — Encounter: Payer: Medicare HMO | Admitting: Psychology

## 2021-09-11 ENCOUNTER — Encounter: Payer: Self-pay | Admitting: Psychology

## 2021-09-11 ENCOUNTER — Ambulatory Visit (INDEPENDENT_AMBULATORY_CARE_PROVIDER_SITE_OTHER): Payer: Medicare HMO | Admitting: Psychology

## 2021-09-11 ENCOUNTER — Other Ambulatory Visit: Payer: Self-pay

## 2021-09-11 ENCOUNTER — Ambulatory Visit: Payer: Medicare HMO

## 2021-09-11 DIAGNOSIS — R32 Unspecified urinary incontinence: Secondary | ICD-10-CM | POA: Insufficient documentation

## 2021-09-11 DIAGNOSIS — G3184 Mild cognitive impairment, so stated: Secondary | ICD-10-CM

## 2021-09-11 DIAGNOSIS — K219 Gastro-esophageal reflux disease without esophagitis: Secondary | ICD-10-CM | POA: Insufficient documentation

## 2021-09-11 DIAGNOSIS — F4310 Post-traumatic stress disorder, unspecified: Secondary | ICD-10-CM | POA: Diagnosis not present

## 2021-09-11 DIAGNOSIS — R4189 Other symptoms and signs involving cognitive functions and awareness: Secondary | ICD-10-CM

## 2021-09-11 DIAGNOSIS — F330 Major depressive disorder, recurrent, mild: Secondary | ICD-10-CM | POA: Diagnosis not present

## 2021-09-11 DIAGNOSIS — Z87820 Personal history of traumatic brain injury: Secondary | ICD-10-CM | POA: Insufficient documentation

## 2021-09-11 DIAGNOSIS — K6289 Other specified diseases of anus and rectum: Secondary | ICD-10-CM | POA: Insufficient documentation

## 2021-09-11 DIAGNOSIS — F329 Major depressive disorder, single episode, unspecified: Secondary | ICD-10-CM | POA: Insufficient documentation

## 2021-09-11 DIAGNOSIS — F411 Generalized anxiety disorder: Secondary | ICD-10-CM | POA: Diagnosis not present

## 2021-09-11 HISTORY — DX: Mild cognitive impairment of uncertain or unknown etiology: G31.84

## 2021-09-11 NOTE — Progress Notes (Signed)
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 Percentage 71 (41) 18 Below Average    Recognition Discriminability 6 (47) 38 Average  Story Learning       Immediate Recall 49/80 (37) 9 Below Average    Delayed Recall 26/40 (39) 14 Below Average    Retention Percentage 84 (44) 27 Average  Daily Living Memory       Immediate Recall 42/51 (47) 38 Average    Delayed Recall 17/17 (61) 86 Above Average    Retention Percentage 106 (66) 95 Well Above Average    Recognition Hits 10/10 (60) 84 Above Average       Attention/Executive Function:     Trail Making Test (TMT): Raw Score (T Score) Percentile     Part A 32 secs.,  1 error (48) 42 Average    Part B 52 secs.,  0 errors (59) 82 Above Average         Scaled Score Percentile   WAIS-IV Coding: 10 50 Average       NAB Attention Module, Form 1: T Score Percentile     Digits Forward 39 14 Below Average    Digits Backwards 39 14 Below Average       D-KEFS Color-Word Interference Test: Raw Score (Scaled Score) Percentile     Color Naming 31 secs. (11) 63 Average    Word Reading 29 secs. (8) 25 Average    Inhibition 47 secs. (14) 82 Well Above  Average      Total Errors 2 errors (10) 50 Average    Inhibition/Switching 81 secs. (9) 37 Average      Total Errors 2 errors (11) 63 Average       [place] Card Sorting Test: Raw Score Percentile     Categories (trials) 1 (64) 6-10 Well Below Average    Total Errors 25 14 Below Average    Perseverative Errors 10 21 Below Average    Non-Perseverative Errors 15 5 Well Below Average    Failure to Maintain Set 0 --- ---       NAB Executive Functions Module, Form 1: T Score Percentile     Judgment 62 88 Above Average       Language:     Verbal Fluency Test: Raw Score (T Score) Percentile     Phonemic Fluency (FAS) 24 (33) 5  Well Below Average    Animal Fluency 22 (53) 62 Average        NAB Language Module, Form 1: T Score Percentile     Auditory Comprehension 55 69 Average    Naming 31/31 (54) 66 Average       Visuospatial/Visuoconstruction:      Raw Score Percentile   Clock Drawing: 8/10 --- Within Normal Limits       NAB Spatial Module, Form 1: T Score Percentile     Figure Drawing Copy 72 99 Exceptionally High        Scaled Score Percentile   WAIS-IV Block Design: 10 50 Average       Mood and Personality:      Raw Score Percentile   Beck Depression Inventory - II: 18 --- Mild  PROMIS Anxiety Questionnaire: 24 --- Moderate       Additional Questionnaires:      Raw Score Percentile   PROMIS Sleep Disturbance Questionnaire: 32 --- Moderate   Informed Consent and Coding/Compliance:   The current evaluation represents a clinical evaluation for the purposes previously outlined by the referral source and is in no way reflective of a forensic evaluation.   Mr. Woolbright was provided with a verbal description of the nature and purpose of the present neuropsychological evaluation. Also reviewed were the foreseeable risks and/or discomforts and benefits of the procedure, limits of confidentiality, and mandatory reporting requirements of this provider. The patient was given the  opportunity to ask questions and receive answers about the evaluation. Oral consent to participate was provided by the patient.   This evaluation was conducted by Christia Reading, Ph.D., ABPP-CN, board certified clinical neuropsychologist. Mr. Gali completed a clinical interview with Dr. Melvyn Novas, billed as one unit [phone], and 150 minutes of cognitive testing and scoring, billed as one unit [phone] and four additional units [id number]. Psychometrist Cruzita Lederer, B.S., assisted Dr. Melvyn Novas with test administration and scoring procedures. As a separate and discrete service, Dr. Melvyn Novas spent a total of 160 minutes in interpretation and report writing billed as one unit [phone] and two units [id number].

## 2021-09-11 NOTE — Progress Notes (Signed)
   Psychometrician Note   Cognitive testing was administered to Tom White by Tom White, B.S. (psychometrist) under the supervision of Tom White, Ph.D., licensed psychologist on 09/11/2021. Tom White did not appear overtly distressed by the testing session per behavioral observation or responses across self-report questionnaires. Rest breaks were offered.    The battery of tests administered was selected by Tom White, Ph.D. with consideration to Tom White current level of functioning, the nature of his symptoms, emotional and behavioral responses during interview, level of literacy, observed level of motivation/effort, and the nature of the referral question. This battery was communicated to the psychometrist. Communication between Tom White, Ph.D. and the psychometrist was ongoing throughout the evaluation and Tom White, Ph.D. was immediately accessible at all times. Tom White, Ph.D. provided supervision to the psychometrist on the date of this service to the extent necessary to assure the quality of all services provided.    Tom White will return within approximately 1-2 weeks for an interactive feedback session with Tom White at which time his test performances, clinical impressions, and treatment recommendations will be reviewed in detail. Tom White understands he can contact our office should he require our assistance before this time.  A total of 150 minutes of billable time were spent face-to-face with Tom White by the psychometrist. This includes both test administration and scoring time. Billing for these services is reflected in the clinical report generated by Tom White, Ph.D.  This note reflects time spent with the psychometrician and does not include test scores or any clinical interpretations made by Tom White. The full report will follow in a separate note.

## 2021-09-17 ENCOUNTER — Ambulatory Visit (INDEPENDENT_AMBULATORY_CARE_PROVIDER_SITE_OTHER): Payer: Medicare HMO | Admitting: Psychology

## 2021-09-17 ENCOUNTER — Other Ambulatory Visit: Payer: Self-pay

## 2021-09-17 DIAGNOSIS — F330 Major depressive disorder, recurrent, mild: Secondary | ICD-10-CM | POA: Diagnosis not present

## 2021-09-17 DIAGNOSIS — G3184 Mild cognitive impairment, so stated: Secondary | ICD-10-CM

## 2021-09-17 DIAGNOSIS — F4310 Post-traumatic stress disorder, unspecified: Secondary | ICD-10-CM | POA: Diagnosis not present

## 2021-09-17 DIAGNOSIS — F411 Generalized anxiety disorder: Secondary | ICD-10-CM

## 2021-09-17 NOTE — Progress Notes (Signed)
° °  Neuropsychology Feedback Session Tom White. [place] Department of Neurology  Reason for Referral:   Tom White is a 60 y.o. ambidextrous Caucasian male referred by  Nicholes Rough, PA-C , to characterize his current cognitive functioning and assist with diagnostic clarity and treatment planning in the context of subjective cognitive decline and several psychiatric comorbidities.   Feedback:   [name] completed a comprehensive neuropsychological evaluation on 09/11/2021. Please refer to that encounter for the full report and recommendations. Briefly, results suggested deficits across attention/concentration and phonemic fluency, as well as performance variability across learning and memory. Regarding the latter, greater difficulties were observed when retrieving information after a delay relative to initial learning efforts. He performed poorly on an unstructured task assessing adaptability and problem solving. However, other tasks assessing executive functioning were appropriate. The etiology of ongoing cognitive weakness/inefficiency is unknown at the present time. From a psychiatric perspective, [name] does have a longstanding history of major depressive disorder, generalized anxiety disorder, and PTSD. All of these conditions, whether by themselves or combined, can certainly affect cognitive functioning, especially surrounding attention/concentration and certain aspects of memory. Chronically disrupted sleep will also have a similar effect. At the present time, it is possible that the combination of these factors represents the primary cause for objective and subjective dysfunction.   [name] was accompanied by his wife during the current feedback session. Content of the current session focused on the results of his neuropsychological evaluation. [name] was given the opportunity to ask questions and his questions were answered. He was encouraged to reach out should  additional questions arise. A copy of his report was provided at the conclusion of the visit.      45 minutes were spent conducting the current feedback session with [name], billed as one unit [phone].

## 2021-10-24 ENCOUNTER — Other Ambulatory Visit: Payer: Self-pay

## 2021-10-24 ENCOUNTER — Ambulatory Visit (INDEPENDENT_AMBULATORY_CARE_PROVIDER_SITE_OTHER): Payer: Medicare Other | Admitting: Psychologist

## 2021-10-24 DIAGNOSIS — F331 Major depressive disorder, recurrent, moderate: Secondary | ICD-10-CM | POA: Diagnosis not present

## 2021-10-24 DIAGNOSIS — F411 Generalized anxiety disorder: Secondary | ICD-10-CM | POA: Diagnosis not present

## 2021-10-24 NOTE — Progress Notes (Signed)
                Kyndell Zeiser, PsyD 
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
## 2021-10-24 NOTE — Plan of Care (Signed)

## 2021-11-05 ENCOUNTER — Other Ambulatory Visit: Payer: Self-pay

## 2021-11-05 ENCOUNTER — Ambulatory Visit (INDEPENDENT_AMBULATORY_CARE_PROVIDER_SITE_OTHER): Payer: Medicare Other | Admitting: Psychologist

## 2021-11-05 DIAGNOSIS — F331 Major depressive disorder, recurrent, moderate: Secondary | ICD-10-CM | POA: Diagnosis not present

## 2021-11-05 DIAGNOSIS — F411 Generalized anxiety disorder: Secondary | ICD-10-CM | POA: Diagnosis not present

## 2021-11-05 NOTE — Progress Notes (Signed)
                Adely Facer, PsyD 

## 2021-11-05 NOTE — Progress Notes (Signed)
[facility] Counselor/Therapist Progress Note  Patient ID: Tom White, MRN: [id number],    Date: 11/05/2021  Time Spent: 39: 03 am to 11:45 am; total time: 42 minutes   This session was held via in person. The patient consented to in-person therapy and was in the clinician's office. Limits of confidentiality were discussed with the patient.   Treatment Type: Individual Therapy  Reported Symptoms: Anxiety symptoms.   Mental Status Exam: Appearance:  Casual     Behavior: Appropriate  Motor: Normal  Speech/Language:  Clear and Coherent  Affect: Appropriate  Mood: normal  Thought process: normal  Thought content:   WNL  Sensory/Perceptual disturbances:   WNL  Orientation: oriented to person, place, and time/date  Attention: Good  Concentration: Good  Memory: WNL  Fund of knowledge:  Good  Insight:   Good  Judgment:  Good  Impulse Control: Good   Risk Assessment: Danger to Self:  No Self-injurious Behavior: No Danger to Others: No Duty to Warn:no Physical Aggression / Violence:No  Access to Firearms a concern: No  Gang Involvement:No   Subjective: Beginning the session, the patient described himself as okay. After reviewing the treatment plan, patient stated that he wanted strategies. After discussing and processing strategies, the patient described himself as better. He was agreeable to homework and following up. He denied suicidal and homicidal ideation.    Interventions:  Worked on developing a therapeutic relationship with the patient using active listening and reflective statements. Provided emotional support using empathy and validation. Reviewed the treatment plan with the patient. Reviewed events since the intake. Processed expressed thoughts and emotions. Provided psychoeducation about the evolution of the brain, mindfulness, mindful moments, mindful videos, defusion, and PTSD. Practiced and processed mindfulness and defusion. Praised patient for  experiencing less distress. Assisted in problem solving. Encouraged patient to practice two to three times daily for 3-4 minutes at a time. Assigned homework. Assessed for suicidal and homicidal ideation.   Homework: Implement mindfulness and defusion  Next Session: Review homework and emotional support. Maybe additional coping strategies  Diagnosis: F41.1 generalized anxiety disorder and F33.1 major depressive affective disorder, recurrent, moderate  Plan:   Client Abilities: Friendly and soft spoken  Client Preferences: ACT and CBT  Client statement of Needs: Coping skills and emotional support  Treatment Level: Outpatient  Goals Alleviate depressive symptoms Recognize, accept, and cope with depressive feelings Develop healthy thinking patterns Develop healthy interpersonal relationships Reduce overall frequency, intensity, and duration of anxiety Stabilize anxiety level wile increasing ability to function Enhance ability to effectively cope with full variety of stressors Learn and implement coping skills that result in a reduction of anxiety   Objectives target date for all objectives is 10/24/2022 Verbalize an understanding of the cognitive, physiological, and behavioral components of anxiety Learning and implement calming skills to reduce overall anxiety Verbalize an understanding of the role that cognitive biases play in excessive irrational worry and persistent anxiety symptoms Identify, challenge, and replace based fearful talk Learn and implement problem solving strategies Identify and engage in pleasant activities Learning and implement personal and interpersonal skills to reduce anxiety and improve interpersonal relationships Learn to accept limitations in life and commit to tolerating, rather than avoiding, unpleasant emotions while accomplishing meaningful goals Identify major life conflicts from the past and present that form the basis for present anxiety Maintain  involvement in work, family, and social activities Reestablish a consistent sleep-wake cycle Cooperate with a medical evaluation  Cooperate with a medication evaluation by a physician Verbalize  an accurate understanding of depression Verbalize an understanding of the treatment Identify and replace thoughts that support depression Learn and implement behavioral strategies Verbalize an understanding and resolution of current interpersonal problems Learn and implement problem solving and decision making skills Learn and implement conflict resolution skills to resolve interpersonal problems Verbalize an understanding of healthy and unhealthy emotions verbalize insight into how past relationships may be influence current experiences with depression Use mindfulness and acceptance strategies and increase value based behavior  Increase hopeful statements about the future.  Interventions Engage the patient in behavioral activation Use instruction, modeling, and role-playing to build the client's general social, communication, and/or conflict resolution skills Use Acceptance and Commitment Therapy to help client accept uncomfortable realities in order to accomplish value-consistent goals Reinforce the client's insight into the role of his/her past emotional pain and present anxiety  Support the client in following through with work, family, and social activities Teach and implement sleep hygiene practices  Refer the patient to a physician for a psychotropic medication consultation Monito the clint's psychotropic medication compliance Discuss how anxiety typically involves excessive worry, various bodily expressions of tension, and avoidance of what is threatening that interact to maintain the problem  Teach the patient relaxation skills Assign the patient homework Discuss examples demonstrating that unrealistic worry overestimates the probability of threats and underestimates patient's ability   Assist the patient in analyzing his or her worries Help patient understand that avoidance is reinforcing  Consistent with treatment model, discuss how change in cognitive, behavioral, and interpersonal can help client alleviate depression CBT Behavioral activation help the client explore the relationship, nature of the dispute,  Help the client develop new interpersonal skills and relationships Conduct Problem solving therapy Teach conflict resolution skills Use a process-experiential approach Conduct TLDP Conduct ACT Evaluate need for psychotropic medication Monitor adherence to medication   The patient and clinician reviewed the treatment plan on 11/05/2021. The patient approved of the treatment plan.   Conception Chancy, PsyD

## 2021-11-28 ENCOUNTER — Other Ambulatory Visit: Payer: Self-pay

## 2021-11-28 ENCOUNTER — Ambulatory Visit (INDEPENDENT_AMBULATORY_CARE_PROVIDER_SITE_OTHER): Payer: Medicare Other | Admitting: Psychologist

## 2021-11-28 DIAGNOSIS — F411 Generalized anxiety disorder: Secondary | ICD-10-CM

## 2021-11-28 DIAGNOSIS — F331 Major depressive disorder, recurrent, moderate: Secondary | ICD-10-CM

## 2021-11-28 NOTE — Progress Notes (Signed)
[facility] Counselor/Therapist Progress Note  Patient ID: Tom White, MRN: [id number],    Date: 11/28/2021  Time Spent: 10:05 am to 10:43 am; total time: 38 minutes   This session was held via in person. The patient consented to in-person therapy and was in the clinician's office. Limits of confidentiality were discussed with the patient.   Treatment Type: Individual Therapy  Reported Symptoms: Anxiety symptoms.   Mental Status Exam: Appearance:  Casual     Behavior: Appropriate  Motor: Normal  Speech/Language:  Clear and Coherent  Affect: Appropriate  Mood: normal  Thought process: normal  Thought content:   WNL  Sensory/Perceptual disturbances:   WNL  Orientation: oriented to person, place, and time/date  Attention: Good  Concentration: Good  Memory: WNL  Fund of knowledge:  Good  Insight:   Poor  Judgment:  Fair  Impulse Control: Good   Risk Assessment: Danger to Self:  No Self-injurious Behavior: No Danger to Others: No Duty to Warn:no Physical Aggression / Violence:No  Access to Firearms a concern: No  Gang Involvement:No   Subjective: Beginning the session, the patient described himself as doing well. Elaborating, he stated that he has not used the coping skills previously discussed. He used the beginning of the session to develop a plan to use previously discussed coping strategies. From there, he stated that he is experiencing conflict with his spouse. Patient acknowledged being apprehensive related to suggestions from the clinician. He acknowledged feeling uncomfortable and does not know what it means to be worthy. He described himself as hesitant related to exploring these concepts. He denied suicidal and homicidal ideation.    Interventions:  Worked on developing a therapeutic relationship with the patient using active listening and reflective statements. Provided emotional support using empathy and validation. Reviewed events since the last  session. Praised patient for doing better. Explored what has assisted the patient. Identified barriers to using coping strategies and explored ways to overcome those barriers. Reviewed mindfulness with the patient. Explored goals for the session. Identified communication challenges with spouse. Used socratic questions to assist the patient gain insight. Attempted to assist with problem solving. Used MI to roll with the resistance. Pointed out discrepancies expressed. Acknowledged asking hard questions of the patient. Used summary and reflective statements. Attempted to process thoughts and emotions. Validated patient's experience. Processed what the clinician could do differently. Discussed different options for the patient moving forward. Assessed for suicidal and homicidal ideation.   Homework: Implement mindfulness and defusion  Next Session: NA  Diagnosis: F41.1 generalized anxiety disorder and F33.1 major depressive affective disorder, recurrent, moderate  Plan:   Client Abilities: Friendly and soft spoken  Client Preferences: ACT and CBT  Client statement of Needs: Coping skills and emotional support  Treatment Level: Outpatient  Goals Alleviate depressive symptoms Recognize, accept, and cope with depressive feelings Develop healthy thinking patterns Develop healthy interpersonal relationships Reduce overall frequency, intensity, and duration of anxiety Stabilize anxiety level wile increasing ability to function Enhance ability to effectively cope with full variety of stressors Learn and implement coping skills that result in a reduction of anxiety   Objectives target date for all objectives is 10/24/2022 Verbalize an understanding of the cognitive, physiological, and behavioral components of anxiety Learning and implement calming skills to reduce overall anxiety Verbalize an understanding of the role that cognitive biases play in excessive irrational worry and persistent anxiety  symptoms Identify, challenge, and replace based fearful talk Learn and implement problem solving strategies Identify and engage in  pleasant activities Learning and implement personal and interpersonal skills to reduce anxiety and improve interpersonal relationships Learn to accept limitations in life and commit to tolerating, rather than avoiding, unpleasant emotions while accomplishing meaningful goals Identify major life conflicts from the past and present that form the basis for present anxiety Maintain involvement in work, family, and social activities Reestablish a consistent sleep-wake cycle Cooperate with a medical evaluation  Cooperate with a medication evaluation by a physician Verbalize an accurate understanding of depression Verbalize an understanding of the treatment Identify and replace thoughts that support depression Learn and implement behavioral strategies Verbalize an understanding and resolution of current interpersonal problems Learn and implement problem solving and decision making skills Learn and implement conflict resolution skills to resolve interpersonal problems Verbalize an understanding of healthy and unhealthy emotions verbalize insight into how past relationships may be influence current experiences with depression Use mindfulness and acceptance strategies and increase value based behavior  Increase hopeful statements about the future.  Interventions Engage the patient in behavioral activation Use instruction, modeling, and role-playing to build the client's general social, communication, and/or conflict resolution skills Use Acceptance and Commitment Therapy to help client accept uncomfortable realities in order to accomplish value-consistent goals Reinforce the client's insight into the role of his/her past emotional pain and present anxiety  Support the client in following through with work, family, and social activities Teach and implement sleep hygiene  practices  Refer the patient to a physician for a psychotropic medication consultation Monito the clint's psychotropic medication compliance Discuss how anxiety typically involves excessive worry, various bodily expressions of tension, and avoidance of what is threatening that interact to maintain the problem  Teach the patient relaxation skills Assign the patient homework Discuss examples demonstrating that unrealistic worry overestimates the probability of threats and underestimates patient's ability  Assist the patient in analyzing his or her worries Help patient understand that avoidance is reinforcing  Consistent with treatment model, discuss how change in cognitive, behavioral, and interpersonal can help client alleviate depression CBT Behavioral activation help the client explore the relationship, nature of the dispute,  Help the client develop new interpersonal skills and relationships Conduct Problem solving therapy Teach conflict resolution skills Use a process-experiential approach Conduct TLDP Conduct ACT Evaluate need for psychotropic medication Monitor adherence to medication   The patient and clinician reviewed the treatment plan on 11/05/2021. The patient approved of the treatment plan.   Conception Chancy, PsyD

## 2022-09-24 ENCOUNTER — Ambulatory Visit: Payer: Medicare Other | Attending: Orthopaedic Surgery | Admitting: Physical Therapy

## 2022-09-24 ENCOUNTER — Encounter: Payer: Self-pay | Admitting: Physical Therapy

## 2022-09-24 ENCOUNTER — Other Ambulatory Visit: Payer: Self-pay

## 2022-09-24 DIAGNOSIS — R293 Abnormal posture: Secondary | ICD-10-CM | POA: Diagnosis present

## 2022-09-24 DIAGNOSIS — M25512 Pain in left shoulder: Secondary | ICD-10-CM | POA: Insufficient documentation

## 2022-09-24 NOTE — Therapy (Signed)
OUTPATIENT PHYSICAL THERAPY SHOULDER EVALUATION   Patient Name: Tom White MRN: [id number] DOB:1961/01/11, 61 y.o., male Today's Date: 09/24/2022  END OF SESSION:  PT End of Session - 09/24/22 1344     Visit Number 1    Number of Visits 16    Date for PT Re-Evaluation 11/25/22    Authorization Type [employer] MEDICARE    Progress Note Due on Visit 10    PT Start Time 1232    PT Stop Time 1325    PT Time Calculation (min) 53 min    Activity Tolerance Patient tolerated treatment well    Behavior During Therapy WFL for tasks assessed/performed             Past Medical History:  Diagnosis Date   Arthritis    "had it in my left ankle" (05/04/2017)   Environmental and seasonal allergies 06/07/2015   Fibrosis of subtalar joint, 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   Mild cognitive impairment of uncertain or unknown etiology 09/11/2021   Mixed hyperlipidemia 06/14/2016   Pain in left ankle and joints of left foot 06/17/2017   Pain in rectum    Pneumonia 2017   PTSD (post-traumatic stress disorder) 01/30/2020   Slow transit constipation 04/02/2021   Urinary incontinence    Past Surgical History:  Procedure Laterality Date   ANKLE ARTHROSCOPY Left ~ 2013   "scraped out arthritis"   ANKLE ARTHROSCOPY WITH FUSION Left 09/22/2017   Procedure: LEFT ANKLE POSTERIOR ARTHROSCOPIC SUBTALAR ARTHRODESIS;  Surgeon: Newt Minion, MD;  Location: [place];  Service: Orthopedics;  Laterality: Left;   ANKLE FRACTURE SURGERY Left 40/9735   APPLICATION OF WOUND VAC Right 32/99/2426   foot    APPLICATION OF WOUND VAC Right 05/03/2017   Procedure: APPLICATION OF WOUND VAC;  Surgeon: Marybelle Killings, MD;  Location: [place];  Service: Orthopedics;  Laterality: Right;   FRACTURE SURGERY     I & D EXTREMITY Right 05/03/2017   foot   I & D EXTREMITY Right 05/03/2017   Procedure: IRRIGATION AND DEBRIDEMENT EXTREMITY RIGHT, REPAIR OF POSTERIOR TIBIAL TENDON;  Surgeon: Marybelle Killings, MD;  Location: [place];  Service: Orthopedics;  Laterality: Right;   [place]?   POSTERIOR TIBIAL TENDON REPAIR Right 05/03/2017   SUBTALAR JOINT ARTHROEREISIS Left 1985?   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   SUBJECTIVE:                                                                                                                                                                                      SUBJECTIVE STATEMENT: Pt states that he  started swimming at the [facility] 1x per week when he started noting severe pain in his L shoulder. He states that since the initial injury he has not returned to the pool. He is currently learning to play piano, but would like to get back to swimming for recreation.   PERTINENT HISTORY: Arthritis, Depression  PAIN:  Are you having pain? Yes: NPRS scale: 4-5/10 Pain location: L shoulder  Pain description: Sharp  Aggravating factors: Lifting it overhead, sometimes when he hangs it by his side.  Relieving factors: Rest, ice.   PRECAUTIONS: None  WEIGHT BEARING RESTRICTIONS: No  FALLS:  Has patient fallen in last 6 months? No  LIVING ENVIRONMENT: Lives with: lives with their spouse Lives in: House/apartment Stairs: Yes: Internal: 12 steps; on right going up and External: 6 steps; bilateral but cannot reach both Has following equipment at home: None  OCCUPATION: Retired   PLOF: Independent  PATIENT GOALS: Pt would like to get back to recreational activities.   NEXT MD VISIT:   OBJECTIVE:   DIAGNOSTIC FINDINGS:  Pt reports getting an X-ray with no signifcant findings.  PATIENT SURVEYS:  FOTO 54.09%, 65% in 10 visits.   COGNITION: Overall cognitive status: Within functional limits for tasks assessed     SENSATION: WFL  POSTURE: Rounded shoulders, and forward head.   UPPER EXTREMITY ROM:   Active ROM Right eval Left eval  Shoulder flexion WFL 180  Shoulder Abduction  WFL 115 P!  Shoulder Extension WFL 40 P!  Shoulder internal rotation [facility]  Shoulder external rotation [facility] WFL  (Blank rows = not tested)  UPPER EXTREMITY MMT:  MMT Right eval Left eval  Shoulder flexion 5/5 5/5  Shoulder internal rotation 5/5 5/5  Shoulder external rotation 5/5 5/5  (Blank rows = not tested)  SHOULDER SPECIAL TESTS: Impingement tests: Neer impingement test: negative and Hawkins/Kennedy impingement test: negative  JOINT MOBILITY TESTING:  Assess next  session   PALPATION:  Tenderness at supraspinatus insertion.    TODAY'S TREATMENT:  DATE: Creating, reviewing, and completing below HEP   PATIENT EDUCATION: Education details: Educated pt on anatomy and physiology of current symptoms, FOTO, diagnosis, prognosis, HEP,  and POC. Person educated: Patient Education method: Customer service manager Education comprehension: verbalized understanding and returned demonstration  HOME EXERCISE PROGRAM: Access Code: [email or web address] URL: [email or web address] Date: 09/24/2022 Prepared by: Rudi Heap  Exercises - Single Arm Serratus Punches in Supine with Dumbbell  - 2 x daily - 7 x weekly - 2 sets - 10 reps - Supine Shoulder Horizontal Abduction with Resistance  - 2 x daily - 7 x weekly - 2 sets - 10 reps  ASSESSMENT:  CLINICAL IMPRESSION: Patient referred to PT for L shoulder pain. He demonstrates decreased ROM secondary to pain. Pt's symptoms are consistent with posterior impingement of his L shoulder. Encouraged use of ice as needed and to hold off on repetitive movements into ER, ext and abduction at this time. Patient will benefit from skilled PT to address below impairments, limitations and improve overall function.  OBJECTIVE IMPAIRMENTS: decreased activity tolerance, decreased shoulder mobility, decreased ROM, decreased strength, impaired flexibility, impaired UE use, postural dysfunction, and pain.  ACTIVITY LIMITATIONS: reaching, lifting, carry,  cleaning, driving, and or occupation  PERSONAL FACTORS: Arthritis, Depression also affecting patient's functional outcome.  REHAB POTENTIAL: Good  CLINICAL DECISION MAKING: Stable/uncomplicated  EVALUATION COMPLEXITY: Low    GOALS: Short term PT Goals Target date: 10/15/2022 Pt will be I and compliant with HEP. Baseline:  Goal status:  New Pt will decrease pain by 25% overall Baseline: Goal status: New  Long term PT goals Target date: 11/19/2022 Pt will improve Rt shoulder AROM to [facility] to improve functional reaching Baseline: Goal status: New Pt will be able to perform recreational activities without reported pain.  Baseline:  Goal status:  Pt will improve FOTO to at least 65% functional to show improved function Baseline: Goal status: New Pt will reduce pain to overall less than <2/10 with usual activity and work activity. Baseline: Goal status: New  PLAN: PT FREQUENCY: 1-2x per week  PT DURATION: 8 weeks  PLANNED INTERVENTIONS (unless contraindicated): aquatic PT, Canalith repositioning, cryotherapy, Electrical stimulation, Iontophoresis with 4 mg/ml dexamethasome, Moist heat, traction, Ultrasound, gait training, Therapeutic exercise, balance training, neuromuscular re-education, patient/family education, prosthetic training, manual techniques, passive ROM, dry needling, taping, vasopnuematic device, vestibular, spinal manipulations, joint manipulations  PLAN FOR NEXT SESSION: review HEP, Assess lower trap and posterior capsule. Strengthen parascapular muscles.     Lynden Ang, PT 09/24/2022, 2:00 PM

## 2022-10-07 ENCOUNTER — Ambulatory Visit: Payer: Medicare Other | Attending: Orthopaedic Surgery | Admitting: Physical Therapy

## 2022-10-07 DIAGNOSIS — R293 Abnormal posture: Secondary | ICD-10-CM

## 2022-10-07 DIAGNOSIS — R252 Cramp and spasm: Secondary | ICD-10-CM | POA: Diagnosis present

## 2022-10-07 DIAGNOSIS — M6281 Muscle weakness (generalized): Secondary | ICD-10-CM | POA: Diagnosis present

## 2022-10-07 DIAGNOSIS — M25512 Pain in left shoulder: Secondary | ICD-10-CM

## 2022-10-07 NOTE — Therapy (Unsigned)
OUTPATIENT PHYSICAL THERAPY TREATMENT NOTE   Patient Name: Tom White MRN: [id number] DOB:06-Jun-1961, 62 y.o., male Today's Date: 10/08/2022   REFERRING PROVIDER: Cassell Clement., MD   END OF SESSION:   PT End of Session - 10/08/22 1359     Visit Number 3    Number of Visits 16    Date for PT Re-Evaluation 11/25/22    Authorization Type [employer] MEDICARE    Progress Note Due on Visit 10    PT Start Time 1400    PT Stop Time 4098    PT Time Calculation (min) 45 min    Activity Tolerance Patient tolerated treatment well    Behavior During Therapy WFL for tasks assessed/performed              Past Medical History:  Diagnosis Date   Arthritis    "had it in my left ankle" (05/04/2017)   Environmental and seasonal allergies 06/07/2015   Fibrosis of subtalar joint, 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   Mild cognitive impairment of uncertain or unknown etiology 09/11/2021   Mixed hyperlipidemia 06/14/2016   Pain in left ankle and joints of left foot 06/17/2017   Pain in rectum    Pneumonia 2017   PTSD (post-traumatic stress disorder) 01/30/2020   Slow transit constipation 04/02/2021   Urinary incontinence    Past Surgical History:  Procedure Laterality Date   ANKLE ARTHROSCOPY Left ~ 2013   "scraped out arthritis"   ANKLE ARTHROSCOPY WITH FUSION Left 09/22/2017   Procedure: LEFT ANKLE POSTERIOR ARTHROSCOPIC SUBTALAR ARTHRODESIS;  Surgeon: Newt Minion, MD;  Location: [place];  Service: Orthopedics;  Laterality: Left;   ANKLE FRACTURE SURGERY Left 08/9146    APPLICATION OF WOUND VAC Right 82/95/6213   foot   APPLICATION OF WOUND VAC Right 05/03/2017   Procedure: APPLICATION OF WOUND VAC;  Surgeon: Marybelle Killings, MD;  Location: [place];  Service: Orthopedics;  Laterality: Right;   FRACTURE SURGERY     I & D EXTREMITY Right 05/03/2017   foot   I & D EXTREMITY Right 05/03/2017   Procedure: IRRIGATION AND DEBRIDEMENT EXTREMITY RIGHT, REPAIR OF POSTERIOR TIBIAL TENDON;  Surgeon: Marybelle Killings, MD;  Location: [place];  Service: Orthopedics;  Laterality: Right;   [place]?   POSTERIOR TIBIAL TENDON REPAIR Right 05/03/2017   SUBTALAR JOINT ARTHROEREISIS Left 1985?   TONSILLECTOMY     Patient Active Problem List   Diagnosis Date Noted   GERD (gastroesophageal reflux disease) 09/11/2021   Mild cognitive impairment of uncertain or unknown etiology 09/11/2021   Urinary incontinence    Pain in rectum    Major depressive disorder    History of concussion    Malignant melanoma of skin of back 04/16/2021   Slow transit constipation 04/02/2021   PTSD (post-traumatic stress disorder) 01/30/2020   Pain in left ankle and joints of left foot 06/17/2017   Fibrosis of subtalar joint, left 01/21/2017   Mixed hyperlipidemia 06/14/2016   Environmental and seasonal allergies 06/07/2015   HSV-2 (herpes simplex  virus 2) infection 06/07/2015   Generalized anxiety disorder 06/07/2015    REFERRING DIAG: Pain in left shoulder [M25.512], Bicipital tendinitis, left shoulder [M75.22]    THERAPY DIAG:  Abnormal posture  Acute pain of left shoulder  Rationale for Evaluation and Treatment Rehabilitation  PERTINENT HISTORY: Arthritis, Depression   PRECAUTIONS: None   SUBJECTIVE:                                                                                                                                                                                      SUBJECTIVE STATEMENT:  Pt states that he is not having any muscle fatigue after  yesterday, however he continues to have anterior shoulder pain in his biceps.    PAIN:  Are you having pain? Yes: NPRS scale: 4-5/10 Pain location: L shoulder  Pain description: Sharp  Aggravating factors: Lifting it overhead, sometimes when he hangs it by his side.  Relieving factors: Rest, ice.    OBJECTIVE:    DIAGNOSTIC FINDINGS:  Pt reports getting an X-ray with no signifcant findings.   PATIENT SURVEYS:  FOTO 54.09%, 65% in 10 visits.    COGNITION: Overall cognitive status: Within functional limits for tasks assessed                                     SENSATION: WFL   POSTURE: Rounded shoulders, and forward head.    UPPER EXTREMITY ROM:    Active ROM Right eval Left eval  Shoulder flexion WFL 180  Shoulder Abduction  WFL 115 P!  Shoulder Extension WFL 40 P!  Shoulder internal rotation [facility]  Shoulder external rotation [facility] WFL  (Blank rows = not tested)   UPPER EXTREMITY MMT:   MMT Right eval Left eval  Shoulder flexion 5/5 5/5  Shoulder internal rotation 5/5 5/5  Shoulder external rotation 5/5 5/5  (Blank rows = not tested)   SHOULDER SPECIAL TESTS: Impingement tests: Neer impingement test: negative and Hawkins/Kennedy impingement test: negative   JOINT MOBILITY TESTING:  Assess next session      PALPATION:  Tenderness at supraspinatus insertion.               TODAY'S TREATMENT: DATE: 10/08/2022: UBE 2.5 min fwd/ bkwd STM to biceps, GHJ mobs posterior/ inferior.  Trigger Point Dry-Needling  Treatment instructions: Expect mild to moderate muscle soreness. S/S of pneumothorax if dry needled over a lung field, and to seek immediate medical attention should they occur. Patient verbalized understanding of these instructions and education.  Patient Consent Given: Yes Education handout provided: No Muscles treated: L UT  Electrical stimulation performed: No Parameters: N/A Treatment response/outcome: Pt reports decreased tension Rows with GTB  2x15  Extension with GTB 2x15  Moist heat to L shoulder last 5 min of session.    10/07/2022: UBE 2.5 min fwd/ bkwd STM to biceps, GHJ mobs posterior/ inferior.  Sleeper stretch 2x30 sec Rows with GTB 2x15  Extension with GTB 2x15                                                                                                                                     DATE: Creating, reviewing, and completing below HEP     PATIENT EDUCATION: Education details: Educated pt on anatomy and physiology of current symptoms, FOTO, diagnosis, prognosis, HEP,  and POC. Person educated: Patient Education method: Customer service manager Education comprehension: verbalized understanding and returned demonstration   HOME EXERCISE PROGRAM: Access Code: [email or web address] URL: [email or web address] Date: 09/24/2022 Prepared by: Rudi Heap   Exercises - Single Arm Serratus Punches in Supine with Dumbbell  - 2 x daily - 7 x weekly - 2 sets - 10 reps - Supine Shoulder Horizontal Abduction with Resistance  - 2 x daily - 7 x weekly - 2 sets - 10 reps   ASSESSMENT:   CLINICAL IMPRESSION: Pt returns to PT with minimal muscle fatigue despite progressing strengthening exercises today. Due to significant tension in biceps, focused on STM to biceps and deltoid. TPDN to L UT with muscle with multiple twitches noted, but no adverse effects. Continued with strengthening to tolerance. Ended session with moist heat to L UE. Pt will continue to benefit from skilled PT to address continued deficits.     OBJECTIVE IMPAIRMENTS: decreased activity tolerance, decreased shoulder mobility, decreased ROM, decreased strength, impaired flexibility, impaired UE use, postural dysfunction, and pain.   ACTIVITY LIMITATIONS: reaching, lifting, carry,  cleaning, driving, and or occupation   PERSONAL FACTORS: Arthritis, Depression also affecting patient's functional outcome.   REHAB POTENTIAL: Good   CLINICAL DECISION  MAKING: Stable/uncomplicated   EVALUATION COMPLEXITY: Low       GOALS: Short term PT Goals Target date: 10/15/2022 Pt will be I and compliant with HEP. Baseline:  Goal status: New Pt will decrease pain by 25% overall Baseline: Goal status: New   Long term PT goals Target date: 11/19/2022 Pt will improve Rt shoulder AROM to [facility] to improve functional reaching Baseline: Goal status: New Pt will be able to perform recreational activities without reported pain.  Baseline:  Goal status:  Pt will improve FOTO to at least 65% functional to show improved function Baseline: Goal status: New Pt will reduce pain to overall less than <2/10 with usual activity and work activity. Baseline: Goal status: New   PLAN: PT FREQUENCY: 1-2x per week   PT DURATION: 8 weeks   PLANNED INTERVENTIONS (unless contraindicated): aquatic PT, Canalith repositioning, cryotherapy, Electrical stimulation, Iontophoresis with 4 mg/ml dexamethasome, Moist heat, traction, Ultrasound, gait training, Therapeutic exercise,  balance training, neuromuscular re-education, patient/family education, prosthetic training, manual techniques, passive ROM, dry needling, taping, vasopnuematic device, vestibular, spinal manipulations, joint manipulations   PLAN FOR NEXT SESSION: Strengthen parascapular muscles, manual as needed for pain and tension.    Lynden Ang, PT 10/08/2022, 3:50 PM

## 2022-10-07 NOTE — Therapy (Signed)
OUTPATIENT PHYSICAL THERAPY TREATMENT NOTE   Patient Name: Tom White MRN: [id number] DOB:1961-01-12, 62 y.o., male Today's Date: 10/07/2022   REFERRING PROVIDER: Cassell Clement., MD   END OF SESSION:   PT End of Session - 10/07/22 1444     Visit Number 2    Number of Visits 16    Date for PT Re-Evaluation 11/25/22    Authorization Type [employer] MEDICARE    Progress Note Due on Visit 10    PT Start Time 1400    PT Stop Time 1440    PT Time Calculation (min) 40 min    Activity Tolerance Patient tolerated treatment well    Behavior During Therapy WFL for tasks assessed/performed             Past Medical History:  Diagnosis Date   Arthritis    "had it in my left ankle" (05/04/2017)   Environmental and seasonal allergies 06/07/2015   Fibrosis of subtalar joint, 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   Mild cognitive impairment of uncertain or unknown etiology 09/11/2021   Mixed hyperlipidemia 06/14/2016   Pain in left ankle and joints of left foot 06/17/2017   Pain in rectum    Pneumonia 2017   PTSD (post-traumatic stress disorder) 01/30/2020   Slow transit constipation 04/02/2021   Urinary incontinence    Past Surgical History:  Procedure Laterality Date   ANKLE ARTHROSCOPY Left ~ 2013   "scraped out arthritis"   ANKLE ARTHROSCOPY WITH FUSION Left 09/22/2017   Procedure: LEFT ANKLE POSTERIOR ARTHROSCOPIC SUBTALAR ARTHRODESIS;  Surgeon: Newt Minion, MD;  Location: [place];  Service: Orthopedics;  Laterality: Left;   ANKLE FRACTURE SURGERY Left 98/3382    APPLICATION OF WOUND VAC Right 50/53/9767   foot   APPLICATION OF WOUND VAC Right 05/03/2017   Procedure: APPLICATION OF WOUND VAC;  Surgeon: Marybelle Killings, MD;  Location: [place];  Service: Orthopedics;  Laterality: Right;   FRACTURE SURGERY     I & D EXTREMITY Right 05/03/2017   foot   I & D EXTREMITY Right 05/03/2017   Procedure: IRRIGATION AND DEBRIDEMENT EXTREMITY RIGHT, REPAIR OF POSTERIOR TIBIAL TENDON;  Surgeon: Marybelle Killings, MD;  Location: [place];  Service: Orthopedics;  Laterality: Right;   [place]?   POSTERIOR TIBIAL TENDON REPAIR Right 05/03/2017   SUBTALAR JOINT ARTHROEREISIS Left 1985?   TONSILLECTOMY     Patient Active Problem List   Diagnosis Date Noted   GERD (gastroesophageal reflux disease) 09/11/2021   Mild cognitive impairment of uncertain or unknown etiology 09/11/2021   Urinary incontinence    Pain in rectum    Major depressive disorder    History of concussion    Malignant melanoma of skin of back 04/16/2021   Slow transit constipation 04/02/2021   PTSD (post-traumatic stress disorder) 01/30/2020   Pain in left ankle and joints of left foot 06/17/2017   Fibrosis of subtalar joint, left 01/21/2017   Mixed hyperlipidemia 06/14/2016   Environmental and seasonal allergies 06/07/2015   HSV-2 (herpes simplex virus  2) infection 06/07/2015   Generalized anxiety disorder 06/07/2015    REFERRING DIAG: Pain in left shoulder [M25.512], Bicipital tendinitis, left shoulder [M75.22]    THERAPY DIAG:  Abnormal posture  Acute pain of left shoulder  Rationale for Evaluation and Treatment Rehabilitation  PERTINENT HISTORY: Arthritis, Depression   PRECAUTIONS: None   SUBJECTIVE:                                                                                                                                                                                      SUBJECTIVE STATEMENT:  Pt reports slight improvements, but denies returning to swimming  or any other recreational activities at this time.    PAIN:  Are you having pain? Yes: NPRS scale: 4-5/10 Pain location: L shoulder  Pain description: Sharp  Aggravating factors: Lifting it overhead, sometimes when he hangs it by his side.  Relieving factors: Rest, ice.    OBJECTIVE:    DIAGNOSTIC FINDINGS:  Pt reports getting an X-ray with no signifcant findings.   PATIENT SURVEYS:  FOTO 54.09%, 65% in 10 visits.    COGNITION: Overall cognitive status: Within functional limits for tasks assessed                                     SENSATION: WFL   POSTURE: Rounded shoulders, and forward head.    UPPER EXTREMITY ROM:    Active ROM Right eval Left eval  Shoulder flexion WFL 180  Shoulder Abduction  WFL 115 P!  Shoulder Extension WFL 40 P!  Shoulder internal rotation [facility]  Shoulder external rotation [facility] WFL  (Blank rows = not tested)   UPPER EXTREMITY MMT:   MMT Right eval Left eval  Shoulder flexion 5/5 5/5  Shoulder internal rotation 5/5 5/5  Shoulder external rotation 5/5 5/5  (Blank rows = not tested)   SHOULDER SPECIAL TESTS: Impingement tests: Neer impingement test: negative and Hawkins/Kennedy impingement test: negative   JOINT MOBILITY TESTING:  Assess next session      PALPATION:  Tenderness at supraspinatus insertion.               TODAY'S TREATMENT: DATE: 10/07/2022: UBE 2.5 min fwd/ bkwd STM to biceps, GHJ mobs posterior/ inferior.  Sleeper stretch 2x30 sec Rows with GTB 2x15  Extension with GTB 2x15  DATE: Creating, reviewing, and completing below HEP     PATIENT EDUCATION: Education details: Educated pt on anatomy and physiology of current symptoms, FOTO, diagnosis, prognosis, HEP,  and POC. Person educated: Patient Education method: Customer service manager Education comprehension: verbalized  understanding and returned demonstration   HOME EXERCISE PROGRAM: Access Code: [email or web address] URL: [email or web address] Date: 09/24/2022 Prepared by: Rudi Heap   Exercises - Single Arm Serratus Punches in Supine with Dumbbell  - 2 x daily - 7 x weekly - 2 sets - 10 reps - Supine Shoulder Horizontal Abduction with Resistance  - 2 x daily - 7 x weekly - 2 sets - 10 reps   ASSESSMENT:   CLINICAL IMPRESSION: Pt returns to PT with some improvements, but continued pain with abduction. He now notes anterior shoulder pain as well with palpation and when on UBE. Session with focus on shoulder mobility with STM and GHJ mobs. Pt has increased tension noted in his shoulder capsule and biceps. Pt tolerated all prescribed exercises targeting RTC well, but requires cues for pain free ranges and forum. Pt most challenged with resisted ER today. Provided updated HEP. Plan to continue to progress pt as tolerated. Pt will continue to benefit from skilled PT to address continued deficits.     OBJECTIVE IMPAIRMENTS: decreased activity tolerance, decreased shoulder mobility, decreased ROM, decreased strength, impaired flexibility, impaired UE use, postural dysfunction, and pain.   ACTIVITY LIMITATIONS: reaching, lifting, carry,  cleaning, driving, and or occupation   PERSONAL FACTORS: Arthritis, Depression also affecting patient's functional outcome.   REHAB POTENTIAL: Good   CLINICAL DECISION MAKING: Stable/uncomplicated   EVALUATION COMPLEXITY: Low       GOALS: Short term PT Goals Target date: 10/15/2022 Pt will be I and compliant with HEP. Baseline:  Goal status: New Pt will decrease pain by 25% overall Baseline: Goal status: New   Long term PT goals Target date: 11/19/2022 Pt will improve Rt shoulder AROM to [facility] to improve functional reaching Baseline: Goal status: New Pt will be able to perform recreational activities without reported pain.  Baseline:  Goal status:  Pt will  improve FOTO to at least 65% functional to show improved function Baseline: Goal status: New Pt will reduce pain to overall less than <2/10 with usual activity and work activity. Baseline: Goal status: New   PLAN: PT FREQUENCY: 1-2x per week   PT DURATION: 8 weeks   PLANNED INTERVENTIONS (unless contraindicated): aquatic PT, Canalith repositioning, cryotherapy, Electrical stimulation, Iontophoresis with 4 mg/ml dexamethasome, Moist heat, traction, Ultrasound, gait training, Therapeutic exercise, balance training, neuromuscular re-education, patient/family education, prosthetic training, manual techniques, passive ROM, dry needling, taping, vasopnuematic device, vestibular, spinal manipulations, joint manipulations   PLAN FOR NEXT SESSION: Strengthen parascapular muscles, manual as needed for pain and tension.    Lynden Ang, PT 10/07/2022, 2:46 PM

## 2022-10-08 ENCOUNTER — Ambulatory Visit: Payer: Medicare Other | Admitting: Physical Therapy

## 2022-10-08 DIAGNOSIS — R293 Abnormal posture: Secondary | ICD-10-CM | POA: Diagnosis not present

## 2022-10-08 DIAGNOSIS — M25512 Pain in left shoulder: Secondary | ICD-10-CM

## 2022-10-13 ENCOUNTER — Ambulatory Visit: Payer: Medicare Other

## 2022-10-15 ENCOUNTER — Ambulatory Visit: Payer: Medicare Other

## 2022-10-15 DIAGNOSIS — R252 Cramp and spasm: Secondary | ICD-10-CM

## 2022-10-15 DIAGNOSIS — M6281 Muscle weakness (generalized): Secondary | ICD-10-CM

## 2022-10-15 DIAGNOSIS — R293 Abnormal posture: Secondary | ICD-10-CM | POA: Diagnosis not present

## 2022-10-15 DIAGNOSIS — M25512 Pain in left shoulder: Secondary | ICD-10-CM

## 2022-10-15 NOTE — Therapy (Signed)
OUTPATIENT PHYSICAL THERAPY TREATMENT NOTE   Patient Name: Tom White MRN: [id number] DOB:Nov 16, 1960, 62 y.o., male Today's Date: 10/15/2022   REFERRING PROVIDER: Cassell Clement., MD   END OF SESSION:   PT End of Session - 10/15/22 1224     Visit Number 4    Number of Visits 16    Date for PT Re-Evaluation 11/25/22    Authorization Type [employer] MEDICARE    Progress Note Due on Visit 10    PT Start Time 1224    PT Stop Time 1300    PT Time Calculation (min) 36 min    Activity Tolerance Patient tolerated treatment well    Behavior During Therapy WFL for tasks assessed/performed              Past Medical History:  Diagnosis Date   Arthritis    "had it in my left ankle" (05/04/2017)   Environmental and seasonal allergies 06/07/2015   Fibrosis of subtalar joint, 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   Mild cognitive impairment of uncertain or unknown etiology 09/11/2021   Mixed hyperlipidemia 06/14/2016   Pain in left ankle and joints of left foot 06/17/2017   Pain in rectum    Pneumonia 2017   PTSD (post-traumatic stress disorder) 01/30/2020   Slow transit constipation 04/02/2021   Urinary incontinence    Past Surgical History:  Procedure Laterality Date   ANKLE ARTHROSCOPY Left ~ 2013   "scraped out arthritis"   ANKLE ARTHROSCOPY WITH FUSION Left 09/22/2017   Procedure: LEFT ANKLE POSTERIOR ARTHROSCOPIC SUBTALAR ARTHRODESIS;  Surgeon: Newt Minion, MD;  Location: [place];  Service: Orthopedics;  Laterality: Left;   ANKLE FRACTURE SURGERY Left 31/5176    APPLICATION OF WOUND VAC Right 16/04/3709   foot   APPLICATION OF WOUND VAC Right 05/03/2017   Procedure: APPLICATION OF WOUND VAC;  Surgeon: Marybelle Killings, MD;  Location: [place];  Service: Orthopedics;  Laterality: Right;   FRACTURE SURGERY     I & D EXTREMITY Right 05/03/2017   foot   I & D EXTREMITY Right 05/03/2017   Procedure: IRRIGATION AND DEBRIDEMENT EXTREMITY RIGHT, REPAIR OF POSTERIOR TIBIAL TENDON;  Surgeon: Marybelle Killings, MD;  Location: [place];  Service: Orthopedics;  Laterality: Right;   [place]?   POSTERIOR TIBIAL TENDON REPAIR Right 05/03/2017   SUBTALAR JOINT ARTHROEREISIS Left 1985?   TONSILLECTOMY     Patient Active Problem List   Diagnosis Date Noted   GERD (gastroesophageal reflux disease) 09/11/2021   Mild cognitive impairment of uncertain or unknown etiology 09/11/2021   Urinary incontinence    Pain in rectum    Major depressive disorder    History of concussion    Malignant melanoma of skin of back 04/16/2021   Slow transit constipation 04/02/2021   PTSD (post-traumatic stress disorder) 01/30/2020   Pain in left ankle and joints of left foot 06/17/2017   Fibrosis of subtalar joint, left 01/21/2017   Mixed hyperlipidemia 06/14/2016   Environmental and seasonal allergies 06/07/2015   HSV-2 (herpes simplex  virus 2) infection 06/07/2015   Generalized anxiety disorder 06/07/2015    REFERRING DIAG: Pain in left shoulder [M25.512], Bicipital tendinitis, left shoulder [M75.22]    THERAPY DIAG:  Abnormal posture  Acute pain of left shoulder  Muscle weakness (generalized)  Cramp and spasm  Rationale for Evaluation and Treatment Rehabilitation  PERTINENT HISTORY: Arthritis, Depression   PRECAUTIONS: None   SUBJECTIVE:                                                                                                                                                                                      SUBJECTIVE STATEMENT:  Pt states he  is doing better.  Still having pain in impingement zone at about 4/10, but none with arm by his side.    PAIN:  Are you having pain? Yes: NPRS scale: 4-5/10 Pain location: L shoulder  Pain description: Sharp  Aggravating factors: Lifting it overhead, sometimes when he hangs it by his side.  Relieving factors: Rest, ice.    OBJECTIVE:    DIAGNOSTIC FINDINGS:  Pt reports getting an X-ray with no signifcant findings.   PATIENT SURVEYS:  FOTO 54.09%, 65% in 10 visits.    COGNITION: Overall cognitive status: Within functional limits for tasks assessed                                     SENSATION: WFL   POSTURE: Rounded shoulders, and forward head.    UPPER EXTREMITY ROM:    Active ROM Right eval Left eval  Shoulder flexion WFL 180  Shoulder Abduction  WFL 115 P!  Shoulder Extension WFL 40 P!  Shoulder internal rotation [facility]  Shoulder external rotation [facility] WFL  (Blank rows = not tested)   UPPER EXTREMITY MMT:   MMT Right eval Left eval  Shoulder flexion 5/5 5/5  Shoulder internal rotation 5/5 5/5  Shoulder external rotation 5/5 5/5  (Blank rows = not tested)   SHOULDER SPECIAL TESTS: Impingement tests: Neer impingement test: negative and Hawkins/Kennedy impingement test: negative   JOINT MOBILITY TESTING:  Assess next session      PALPATION:  Tenderness at supraspinatus insertion.               TODAY'S TREATMENT: DATE: 10/15/2022: UBE 2.5 min fwd/ bkwd Prone shoulder ext, row and horizontal 2 x 10 with 3 lbs Side lying ER with 3 lbs 2 x 10 Supine Serratus punch x 20 with 3 lb Supine shoulder alphabet with 3 lb A - Z Trigger Point Dry-Needling  Treatment instructions: Expect mild to moderate muscle soreness. S/S of  pneumothorax if dry needled over a lung field, and to seek immediate medical attention should they occur. Patient verbalized understanding of these instructions and education. Patient Consent Given: Yes Education handout provided:  No Muscles treated: L UT, Left supraspinatus and infraspinatus, left biceps Electrical stimulation performed: No Parameters: N/A Treatment response/outcome: Trigger points and taut bands identified in left upper trap, supraspinatus, infraspinatus and biceps by using skilled palpation.  Once identified, dry needling techniques performed.  Pt reports decreased tension,  multiple twitch responses in left upper trap noted with muscle elongation.  Patient has decreased impingement pain reports following treatment.    10/08/2022: UBE 2.5 min fwd/ bkwd STM to biceps, GHJ mobs posterior/ inferior.  Trigger Point Dry-Needling  Treatment instructions: Expect mild to moderate muscle soreness. S/S of pneumothorax if dry needled over a lung field, and to seek immediate medical attention should they occur. Patient verbalized understanding of these instructions and education.  Patient Consent Given: Yes Education handout provided: No Muscles treated: L UT Electrical stimulation performed: No Parameters: N/A Treatment response/outcome: Pt reports decreased tension Rows with GTB 2x15  Extension with GTB 2x15  Moist heat to L shoulder last 5 min of session.    10/07/2022: UBE 2.5 min fwd/ bkwd STM to biceps, GHJ mobs posterior/ inferior.  Sleeper stretch 2x30 sec Rows with GTB 2x15  Extension with GTB 2x15                                                                                                                                     DATE: Creating, reviewing, and completing below HEP     PATIENT EDUCATION: Education details: Educated pt on anatomy and physiology of current symptoms, FOTO, diagnosis, prognosis, HEP,  and POC. Person educated: Patient Education method: Customer service manager Education comprehension: verbalized understanding and returned demonstration   HOME EXERCISE PROGRAM: Access Code: [email or web address] URL: [email or web address] Date: 09/24/2022 Prepared by:  Rudi Heap   Exercises - Single Arm Serratus Punches in Supine with Dumbbell  - 2 x daily - 7 x weekly - 2 sets - 10 reps - Supine Shoulder Horizontal Abduction with Resistance  - 2 x daily - 7 x weekly - 2 sets - 10 reps   ASSESSMENT:   CLINICAL IMPRESSION: Cheyne is progressing appropriately.  He was able to tolerate addition on scapular and shoulder stabilization with 3 lbs.  He had multiple twitch responses in left upper trap with dry needling.  He would benefit from continued skilled PT for left shoulder strengthening and scapular stabilization along with dry needling for symptom control.      OBJECTIVE IMPAIRMENTS: decreased activity tolerance, decreased shoulder mobility, decreased ROM, decreased strength, impaired flexibility, impaired UE use, postural dysfunction, and pain.   ACTIVITY LIMITATIONS: reaching, lifting, carry,  cleaning, driving, and or occupation   PERSONAL FACTORS: Arthritis, Depression also affecting patient's functional outcome.   REHAB POTENTIAL: Good   CLINICAL DECISION MAKING: Stable/uncomplicated  EVALUATION COMPLEXITY: Low       GOALS: Short term PT Goals Target date: 10/15/2022 Pt will be I and compliant with HEP. Baseline:  Goal status: New Pt will decrease pain by 25% overall Baseline: Goal status: New   Long term PT goals Target date: 11/19/2022 Pt will improve Rt shoulder AROM to [facility] to improve functional reaching Baseline: Goal status: New Pt will be able to perform recreational activities without reported pain.  Baseline:  Goal status:  Pt will improve FOTO to at least 65% functional to show improved function Baseline: Goal status: New Pt will reduce pain to overall less than <2/10 with usual activity and work activity. Baseline: Goal status: New   PLAN: PT FREQUENCY: 1-2x per week   PT DURATION: 8 weeks   PLANNED INTERVENTIONS (unless contraindicated): aquatic PT, Canalith repositioning, cryotherapy, Electrical stimulation,  Iontophoresis with 4 mg/ml dexamethasome, Moist heat, traction, Ultrasound, gait training, Therapeutic exercise, balance training, neuromuscular re-education, patient/family education, prosthetic training, manual techniques, passive ROM, dry needling, taping, vasopnuematic device, vestibular, spinal manipulations, joint manipulations   PLAN FOR NEXT SESSION: Strengthen parascapular muscles, manual as needed for pain and tension.    Anderson Malta B. Dustin Bumbaugh, PT 10/15/22 1:11 PM  [facility] [address], [place], [place] [address] Phone # [phone] Fax [phone]

## 2022-10-20 ENCOUNTER — Ambulatory Visit: Payer: Medicare Other

## 2022-10-20 DIAGNOSIS — R293 Abnormal posture: Secondary | ICD-10-CM | POA: Diagnosis not present

## 2022-10-20 DIAGNOSIS — M6281 Muscle weakness (generalized): Secondary | ICD-10-CM

## 2022-10-20 DIAGNOSIS — R252 Cramp and spasm: Secondary | ICD-10-CM

## 2022-10-20 DIAGNOSIS — M25512 Pain in left shoulder: Secondary | ICD-10-CM

## 2022-10-20 NOTE — Therapy (Signed)
OUTPATIENT PHYSICAL THERAPY TREATMENT NOTE   Patient Name: Tom White MRN: [id number] DOB:Jul 19, 1961, 62 y.o., male Today's Date: 10/20/2022   REFERRING PROVIDER: Cassell Clement., MD   END OF SESSION:   PT End of Session - 10/20/22 1322     Visit Number 5    Number of Visits 16    Date for PT Re-Evaluation 11/25/22    Authorization Type [employer] MEDICARE    Progress Note Due on Visit 10    PT Start Time 1230    PT Stop Time 1306    PT Time Calculation (min) 36 min    Activity Tolerance Patient tolerated treatment well    Behavior During Therapy WFL for tasks assessed/performed              Past Medical History:  Diagnosis Date   Arthritis    "had it in my left ankle" (05/04/2017)   Environmental and seasonal allergies 06/07/2015   Fibrosis of subtalar joint, 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   Mild cognitive impairment of uncertain or unknown etiology 09/11/2021   Mixed hyperlipidemia 06/14/2016   Pain in left ankle and joints of left foot 06/17/2017   Pain in rectum    Pneumonia 2017   PTSD (post-traumatic stress disorder) 01/30/2020   Slow transit constipation 04/02/2021   Urinary incontinence    Past Surgical History:  Procedure Laterality Date   ANKLE ARTHROSCOPY Left ~ 2013   "scraped out arthritis"   ANKLE ARTHROSCOPY WITH FUSION Left 09/22/2017   Procedure: LEFT ANKLE POSTERIOR ARTHROSCOPIC SUBTALAR ARTHRODESIS;  Surgeon: Newt Minion, MD;  Location: [place];  Service: Orthopedics;  Laterality: Left;   ANKLE FRACTURE SURGERY Left 58/5277    APPLICATION OF WOUND VAC Right 82/42/3536   foot   APPLICATION OF WOUND VAC Right 05/03/2017   Procedure: APPLICATION OF WOUND VAC;  Surgeon: Marybelle Killings, MD;  Location: [place];  Service: Orthopedics;  Laterality: Right;   FRACTURE SURGERY     I & D EXTREMITY Right 05/03/2017   foot   I & D EXTREMITY Right 05/03/2017   Procedure: IRRIGATION AND DEBRIDEMENT EXTREMITY RIGHT, REPAIR OF POSTERIOR TIBIAL TENDON;  Surgeon: Marybelle Killings, MD;  Location: [place];  Service: Orthopedics;  Laterality: Right;   [place]?   POSTERIOR TIBIAL TENDON REPAIR Right 05/03/2017   SUBTALAR JOINT ARTHROEREISIS Left 1985?   TONSILLECTOMY     Patient Active Problem List   Diagnosis Date Noted   GERD (gastroesophageal reflux disease) 09/11/2021   Mild cognitive impairment of uncertain or unknown etiology 09/11/2021   Urinary incontinence    Pain in rectum    Major depressive disorder    History of concussion    Malignant melanoma of skin of back 04/16/2021   Slow transit constipation 04/02/2021   PTSD (post-traumatic stress disorder) 01/30/2020   Pain in left ankle and joints of left foot 06/17/2017   Fibrosis of subtalar joint, left 01/21/2017   Mixed hyperlipidemia 06/14/2016   Environmental and seasonal allergies 06/07/2015   HSV-2 (herpes simplex  virus 2) infection 06/07/2015   Generalized anxiety disorder 06/07/2015    REFERRING DIAG: Pain in left shoulder [M25.512], Bicipital tendinitis, left shoulder [M75.22]    THERAPY DIAG:  Acute pain of left shoulder  Muscle weakness (generalized)  Cramp and spasm  Rationale for Evaluation and Treatment Rehabilitation  PERTINENT HISTORY: Arthritis, Depression   PRECAUTIONS: None   SUBJECTIVE:                                                                                                                                                                                      SUBJECTIVE STATEMENT:  Pt states "I don't know what you  did last time, I think it was the needling, but my neck has not hurt since last visit and I can turn my head easily.  Shoulder is better but still getting some pinching pain with overhead reach"    PAIN:  Are you having pain? Yes: NPRS scale: 4-5/10 Pain location: L shoulder  Pain description: Sharp  Aggravating factors: Lifting it overhead, sometimes when he hangs it by his side.  Relieving factors: Rest, ice.    OBJECTIVE:    DIAGNOSTIC FINDINGS:  Pt reports getting an X-ray with no signifcant findings.   PATIENT SURVEYS:  FOTO 54.09%, 65% in 10 visits.    COGNITION: Overall cognitive status: Within functional limits for tasks assessed                                     SENSATION: WFL   POSTURE: Rounded shoulders, and forward head.    UPPER EXTREMITY ROM:    Active ROM Right eval Left eval  Shoulder flexion WFL 180  Shoulder Abduction  WFL 115 P!  Shoulder Extension WFL 40 P!  Shoulder internal rotation [facility]  Shoulder external rotation [facility] WFL  (Blank rows = not tested)   UPPER EXTREMITY MMT:   MMT Right eval Left eval  Shoulder flexion 5/5 5/5  Shoulder internal rotation 5/5 5/5  Shoulder external rotation 5/5 5/5  (Blank rows = not tested)   SHOULDER SPECIAL TESTS: Impingement tests: Neer impingement test: negative and Hawkins/Kennedy impingement test: negative   JOINT MOBILITY TESTING:  Assess next session      PALPATION:  Tenderness at supraspinatus insertion.               TODAY'S TREATMENT: DATE: 10/20/2022: UBE 2.5 min fwd/ bkwd Prone shoulder ext, row with 4 lbs 2 x 10, and horizontal 2 x 10 with 3 lbs Side lying ER with 3 lbs 2 x 10 Supine Serratus punch x 20 with  4 lb Supine shoulder alphabet with 4 lb A - Z Trigger Point Dry-Needling  Treatment instructions: Expect mild to moderate muscle soreness. S/S of pneumothorax if dry needled over a lung field, and to seek immediate medical attention should they occur. Patient verbalized  understanding of these instructions and education. Patient Consent Given: Yes Education handout provided: No Muscles treated: L UT (did in supine and prone due to multiple Tp's and twitch responses, Left supraspinatus and infraspinatus Electrical stimulation performed: No Parameters: N/A Treatment response/outcome: Trigger points and taut bands identified in left upper trap, supraspinatus, and infraspinatus by using skilled palpation.  Once identified, dry needling techniques performed.  Pt reports decreased tension,  multiple twitch responses in left upper trap noted with muscle elongation.  Patient has decreased impingement pain reports following treatment.    10/15/2022: UBE 2.5 min fwd/ bkwd Prone shoulder ext, row and horizontal 2 x 10 with 3 lbs Side lying ER with 3 lbs 2 x 10 Supine Serratus punch x 20 with 3 lb Supine shoulder alphabet with 3 lb A - Z Trigger Point Dry-Needling  Treatment instructions: Expect mild to moderate muscle soreness. S/S of pneumothorax if dry needled over a lung field, and to seek immediate medical attention should they occur. Patient verbalized understanding of these instructions and education. Patient Consent Given: Yes Education handout provided: No Muscles treated: L UT, Left supraspinatus and infraspinatus, left biceps Electrical stimulation performed: No Parameters: N/A Treatment response/outcome: Trigger points and taut bands identified in left upper trap, supraspinatus, infraspinatus and biceps by using skilled palpation.  Once identified, dry needling techniques performed.  Pt reports decreased tension,  multiple twitch responses in left upper trap noted with muscle elongation.  Patient has decreased impingement pain reports following treatment.    10/08/2022: UBE 2.5 min fwd/ bkwd STM to biceps, GHJ mobs posterior/ inferior.  Trigger Point Dry-Needling  Treatment instructions: Expect mild to moderate muscle soreness. S/S of pneumothorax if dry  needled over a lung field, and to seek immediate medical attention should they occur. Patient verbalized understanding of these instructions and education.  Patient Consent Given: Yes Education handout provided: No Muscles treated: L UT Electrical stimulation performed: No Parameters: N/A Treatment response/outcome: Pt reports decreased tension Rows with GTB 2x15  Extension with GTB 2x15  Moist heat to L shoulder last 5 min of session.       PATIENT EDUCATION: Education details: Educated pt on anatomy and physiology of current symptoms, FOTO, diagnosis, prognosis, HEP,  and POC. Person educated: Patient Education method: Customer service manager Education comprehension: verbalized understanding and returned demonstration   HOME EXERCISE PROGRAM: Access Code: [email or web address] URL: [email or web address] Date: 09/24/2022 Prepared by: Rudi Heap   Exercises - Single Arm Serratus Punches in Supine with Dumbbell  - 2 x daily - 7 x weekly - 2 sets - 10 reps - Supine Shoulder Horizontal Abduction with Resistance  - 2 x daily - 7 x weekly - 2 sets - 10 reps   ASSESSMENT:   CLINICAL IMPRESSION: Eleazar is responding very well to scapular stabilization and dry needling.  He reported complete relief of his neck pain after last visit and had improved cervical rotation.  He was able to tolerate increased resistance with scapular and shoulder stabilization.  He had multiple twitch responses in left upper trap with dry needling again today.  He would benefit from continued skilled PT for left shoulder strengthening and scapular stabilization along with dry needling for symptom control.      OBJECTIVE IMPAIRMENTS:  decreased activity tolerance, decreased shoulder mobility, decreased ROM, decreased strength, impaired flexibility, impaired UE use, postural dysfunction, and pain.   ACTIVITY LIMITATIONS: reaching, lifting, carry,  cleaning, driving, and or occupation   PERSONAL FACTORS:  Arthritis, Depression also affecting patient's functional outcome.   REHAB POTENTIAL: Good   CLINICAL DECISION MAKING: Stable/uncomplicated   EVALUATION COMPLEXITY: Low       GOALS: Short term PT Goals Target date: 10/15/2022 Pt will be I and compliant with HEP. Baseline:  Goal status: New Pt will decrease pain by 25% overall Baseline: Goal status: New   Long term PT goals Target date: 11/19/2022 Pt will improve Rt shoulder AROM to [facility] to improve functional reaching Baseline: Goal status: New Pt will be able to perform recreational activities without reported pain.  Baseline:  Goal status:  Pt will improve FOTO to at least 65% functional to show improved function Baseline: Goal status: New Pt will reduce pain to overall less than <2/10 with usual activity and work activity. Baseline: Goal status: New   PLAN: PT FREQUENCY: 1-2x per week   PT DURATION: 8 weeks   PLANNED INTERVENTIONS (unless contraindicated): aquatic PT, Canalith repositioning, cryotherapy, Electrical stimulation, Iontophoresis with 4 mg/ml dexamethasome, Moist heat, traction, Ultrasound, gait training, Therapeutic exercise, balance training, neuromuscular re-education, patient/family education, prosthetic training, manual techniques, passive ROM, dry needling, taping, vasopnuematic device, vestibular, spinal manipulations, joint manipulations   PLAN FOR NEXT SESSION: Strengthen parascapular muscles, manual as needed for pain and tension.    Anderson Malta B. Argie Applegate, PT 10/20/22 1:34 PM  [facility] [address], [place] [address] [place], [place] [address] Phone # [phone] Fax [phone]

## 2022-10-22 ENCOUNTER — Ambulatory Visit: Payer: Medicare Other

## 2022-10-22 DIAGNOSIS — R293 Abnormal posture: Secondary | ICD-10-CM

## 2022-10-22 DIAGNOSIS — M25512 Pain in left shoulder: Secondary | ICD-10-CM

## 2022-10-22 DIAGNOSIS — M6281 Muscle weakness (generalized): Secondary | ICD-10-CM

## 2022-10-22 DIAGNOSIS — R252 Cramp and spasm: Secondary | ICD-10-CM

## 2022-10-22 NOTE — Therapy (Signed)
OUTPATIENT PHYSICAL THERAPY TREATMENT NOTE   Patient Name: Tom White MRN: [id number] DOB:05/03/1961, 62 y.o., male Today's Date: 10/22/2022   REFERRING PROVIDER: Cassell Clement., MD   END OF SESSION:   PT End of Session - 10/22/22 1530     Visit Number 6    Number of Visits 16    Date for PT Re-Evaluation 11/25/22    Authorization Type [employer] MEDICARE    Progress Note Due on Visit 10    PT Start Time 9702    PT Stop Time 1620    PT Time Calculation (min) 50 min    Activity Tolerance Patient tolerated treatment well    Behavior During Therapy [facility] for tasks assessed/performed              Past Medical History:  Diagnosis Date   Arthritis    "had it in my left ankle" (05/04/2017)   Environmental and seasonal allergies 06/07/2015   Fibrosis of subtalar joint, 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   Mild cognitive impairment of uncertain or unknown etiology 09/11/2021   Mixed hyperlipidemia 06/14/2016   Pain in left ankle and joints of left foot 06/17/2017   Pain in rectum    Pneumonia 2017   PTSD (post-traumatic stress disorder) 01/30/2020   Slow transit constipation 04/02/2021   Urinary incontinence    Past Surgical History:  Procedure Laterality Date   ANKLE ARTHROSCOPY Left ~ 2013   "scraped out arthritis"   ANKLE ARTHROSCOPY WITH FUSION Left 09/22/2017   Procedure: LEFT ANKLE POSTERIOR ARTHROSCOPIC SUBTALAR ARTHRODESIS;  Surgeon: Newt Minion, MD;  Location: [place];  Service: Orthopedics;  Laterality: Left;   ANKLE FRACTURE SURGERY Left 63/7858    APPLICATION OF WOUND VAC Right 85/11/7739   foot   APPLICATION OF WOUND VAC Right 05/03/2017   Procedure: APPLICATION OF WOUND VAC;  Surgeon: Marybelle Killings, MD;  Location: [place];  Service: Orthopedics;  Laterality: Right;   FRACTURE SURGERY     I & D EXTREMITY Right 05/03/2017   foot   I & D EXTREMITY Right 05/03/2017   Procedure: IRRIGATION AND DEBRIDEMENT EXTREMITY RIGHT, REPAIR OF POSTERIOR TIBIAL TENDON;  Surgeon: Marybelle Killings, MD;  Location: [place];  Service: Orthopedics;  Laterality: Right;   [place]?   POSTERIOR TIBIAL TENDON REPAIR Right 05/03/2017   SUBTALAR JOINT ARTHROEREISIS Left 1985?   TONSILLECTOMY     Patient Active Problem List   Diagnosis Date Noted   GERD (gastroesophageal reflux disease) 09/11/2021   Mild cognitive impairment of uncertain or unknown etiology 09/11/2021   Urinary incontinence    Pain in rectum    Major depressive disorder    History of concussion    Malignant melanoma of skin of back 04/16/2021   Slow transit constipation 04/02/2021   PTSD (post-traumatic stress disorder) 01/30/2020   Pain in left ankle and joints of left foot 06/17/2017   Fibrosis of subtalar joint, left 01/21/2017   Mixed hyperlipidemia 06/14/2016   Environmental and seasonal allergies 06/07/2015   HSV-2 (herpes simplex  virus 2) infection 06/07/2015   Generalized anxiety disorder 06/07/2015    REFERRING DIAG: Pain in left shoulder [M25.512], Bicipital tendinitis, left shoulder [M75.22]    THERAPY DIAG:  Acute pain of left shoulder  Muscle weakness (generalized)  Cramp and spasm  Abnormal posture  Rationale for Evaluation and Treatment Rehabilitation  PERTINENT HISTORY: Arthritis, Depression   PRECAUTIONS: None   SUBJECTIVE:                                                                                                                                                                                      SUBJECTIVE STATEMENT:  Pt states "I  am having increased pain since last visit. My neck is actually hurting on the right as well today".  He rates his neck pain at 4/10 but on both sides and shoulder only hurts when I raise it .   PAIN:  Are you having pain? Yes: NPRS scale: 4/10 Pain location: L shoulder  Pain description: Sharp  Aggravating factors: Lifting it overhead, sometimes when he hangs it by his side.  Relieving factors: Rest, ice.    OBJECTIVE:    DIAGNOSTIC FINDINGS:  Pt reports getting an X-ray with no signifcant findings.   PATIENT SURVEYS:  FOTO 54.09%, 65% in 10 visits.    COGNITION: Overall cognitive status: Within functional limits for tasks assessed                                     SENSATION: WFL   POSTURE: Rounded shoulders, and forward head.    UPPER EXTREMITY ROM:    Active ROM Right eval Left eval  Shoulder flexion WFL 180  Shoulder Abduction  WFL 115 P!  Shoulder Extension WFL 40 P!  Shoulder internal rotation [facility]  Shoulder external rotation [facility] WFL  (Blank rows = not tested)   UPPER EXTREMITY MMT:   MMT Right eval Left eval  Shoulder flexion 5/5 5/5  Shoulder internal rotation 5/5 5/5  Shoulder external rotation 5/5 5/5  (Blank rows = not tested)   SHOULDER SPECIAL TESTS: Impingement tests: Neer impingement test: negative and Hawkins/Kennedy impingement test: negative   JOINT MOBILITY TESTING:  Assess next session      PALPATION:  Tenderness at supraspinatus insertion.               TODAY'S TREATMENT: DATE: 10/22/2022: UBE 2.5 min fwd/ bkwd 3 way scapular stabilization x 10 left 4 D ball rolls x 20 each yellow plyo ball Prone shoulder ext, row with 2 lbs 2 x 10, and horizontal 2 x 10 with 2  lbs Side lying ER with 2 lbs 2 x 10 Supine Serratus punch x 20 with 2 lb Supine shoulder alphabet with 2 lb A - Z Trigger Point Dry-Needling  Treatment instructions: Expect mild to moderate muscle soreness. S/S of pneumothorax if dry needled over a lung field, and to  seek immediate medical attention should they occur. Patient verbalized understanding of these instructions and education. Patient Consent Given: Yes Education handout provided: No Muscles treated: right UT (did in supine and prone due to multiple Tp's and twitch responses) Electrical stimulation performed: No Parameters: N/A Treatment response/outcome: Trigger points and taut bands identified in right upper trap by using skilled palpation.  Once identified, dry needling techniques performed.  Pt reports decreased tension,  multiple twitch responses in right upper trap noted with muscle elongation.  Patient has decreased impingement pain reports following treatment.    10/20/2022: UBE 2.5 min fwd/ bkwd Prone shoulder ext, row with 4 lbs 2 x 10, and horizontal 2 x 10 with 3 lbs Side lying ER with 3 lbs 2 x 10 Supine Serratus punch x 20 with 4 lb Supine shoulder alphabet with 4 lb A - Z Trigger Point Dry-Needling  Treatment instructions: Expect mild to moderate muscle soreness. S/S of pneumothorax if dry needled over a lung field, and to seek immediate medical attention should they occur. Patient verbalized understanding of these instructions and education. Patient Consent Given: Yes Education handout provided: No Muscles treated: L UT (did in supine and prone due to multiple Tp's and twitch responses, Left supraspinatus and infraspinatus Electrical stimulation performed: No Parameters: N/A Treatment response/outcome: Trigger points and taut bands identified in left upper trap, supraspinatus, and infraspinatus by using skilled palpation.  Once identified, dry needling techniques performed.  Pt reports decreased tension,  multiple twitch responses in left upper trap noted with muscle elongation.  Patient has decreased impingement pain reports following treatment.    10/15/2022: UBE 2.5 min fwd/ bkwd Prone shoulder ext, row and horizontal 2 x 10 with 3 lbs Side lying ER with 3 lbs 2 x 10 Supine  Serratus punch x 20 with 3 lb Supine shoulder alphabet with 3 lb A - Z Trigger Point Dry-Needling  Treatment instructions: Expect mild to moderate muscle soreness. S/S of pneumothorax if dry needled over a lung field, and to seek immediate medical attention should they occur. Patient verbalized understanding of these instructions and education. Patient Consent Given: Yes Education handout provided: No Muscles treated: L UT, Left supraspinatus and infraspinatus, left biceps Electrical stimulation performed: No Parameters: N/A Treatment response/outcome: Trigger points and taut bands identified in left upper trap, supraspinatus, infraspinatus and biceps by using skilled palpation.  Once identified, dry needling techniques performed.  Pt reports decreased tension,  multiple twitch responses in left upper trap noted with muscle elongation.  Patient has decreased impingement pain reports following treatment.       PATIENT EDUCATION: Education details: Educated pt on anatomy and physiology of current symptoms, FOTO, diagnosis, prognosis, HEP,  and POC. Person educated: Patient Education method: Customer service manager Education comprehension: verbalized understanding and returned demonstration   HOME EXERCISE PROGRAM: Access Code: [email or web address] URL: [email or web address] Date: 10/22/2022 Prepared by: Candyce Churn  Exercises - Prone Shoulder Extension - Single Arm  - 2 x daily - 7 x weekly - 2 sets - 10 reps - Prone Shoulder Row  - 2 x daily - 7 x weekly - 2 sets - 10 reps - Prone Single Arm Shoulder Horizontal Abduction with Scapular Retraction and Palm  Down  - 2 x daily - 7 x weekly - 2 sets - 10 reps - Sidelying Shoulder External Rotation  - 2 x daily - 7 x weekly - 2 sets - 10 reps - Single Arm Serratus Punches in Supine with Dumbbell  - 2 x daily - 7 x weekly - 2 sets - 10 reps   ASSESSMENT:   CLINICAL IMPRESSION: Tabius had a set back and increased neck pain since  last visit.  He does not recall doing anything that would have exacerbated his pain.  We decreased resistance on exercises due to patients increased pain following last session. Multiple twitch responses in right upper trap.    He would benefit from continued skilled PT for left shoulder strengthening and scapular stabilization along with dry needling for symptom control.      OBJECTIVE IMPAIRMENTS: decreased activity tolerance, decreased shoulder mobility, decreased ROM, decreased strength, impaired flexibility, impaired UE use, postural dysfunction, and pain.   ACTIVITY LIMITATIONS: reaching, lifting, carry,  cleaning, driving, and or occupation   PERSONAL FACTORS: Arthritis, Depression also affecting patient's functional outcome.   REHAB POTENTIAL: Good   CLINICAL DECISION MAKING: Stable/uncomplicated   EVALUATION COMPLEXITY: Low       GOALS: Short term PT Goals Target date: 10/15/2022 Pt will be I and compliant with HEP. Baseline:  Goal status: New Pt will decrease pain by 25% overall Baseline: Goal status: New   Long term PT goals Target date: 11/19/2022 Pt will improve Rt shoulder AROM to [facility] to improve functional reaching Baseline: Goal status: New Pt will be able to perform recreational activities without reported pain.  Baseline:  Goal status:  Pt will improve FOTO to at least 65% functional to show improved function Baseline: Goal status: New Pt will reduce pain to overall less than <2/10 with usual activity and work activity. Baseline: Goal status: New   PLAN: PT FREQUENCY: 1-2x per week   PT DURATION: 8 weeks   PLANNED INTERVENTIONS (unless contraindicated): aquatic PT, Canalith repositioning, cryotherapy, Electrical stimulation, Iontophoresis with 4 mg/ml dexamethasome, Moist heat, traction, Ultrasound, gait training, Therapeutic exercise, balance training, neuromuscular re-education, patient/family education, prosthetic training, manual techniques, passive  ROM, dry needling, taping, vasopnuematic device, vestibular, spinal manipulations, joint manipulations   PLAN FOR NEXT SESSION: Strengthen parascapular muscles, manual as needed for pain and tension.    Anderson Malta B. Alando Colleran, PT 10/22/22 11:27 PM   [facility] [address] [place], [place] [address] Phone # [phone] Fax [phone]

## 2022-10-27 ENCOUNTER — Ambulatory Visit: Payer: Medicare Other

## 2022-10-27 DIAGNOSIS — R252 Cramp and spasm: Secondary | ICD-10-CM

## 2022-10-27 DIAGNOSIS — M6281 Muscle weakness (generalized): Secondary | ICD-10-CM

## 2022-10-27 DIAGNOSIS — M25512 Pain in left shoulder: Secondary | ICD-10-CM

## 2022-10-27 DIAGNOSIS — R293 Abnormal posture: Secondary | ICD-10-CM

## 2022-10-27 NOTE — Therapy (Signed)
OUTPATIENT PHYSICAL THERAPY TREATMENT NOTE   Patient Name: Tom White MRN: [id number] DOB:Apr 08, 1961, 62 y.o., male Today's Date: 10/27/2022   REFERRING PROVIDER: Cassell Clement., MD   END OF SESSION:   PT End of Session - 10/27/22 1239     Visit Number 7    Number of Visits 16    Date for PT Re-Evaluation 11/25/22    Authorization Type [employer] MEDICARE    Progress Note Due on Visit 10    PT Start Time 1230    PT Stop Time 1312    PT Time Calculation (min) 42 min    Activity Tolerance Patient tolerated treatment well    Behavior During Therapy WFL for tasks assessed/performed              Past Medical History:  Diagnosis Date   Arthritis    "had it in my left ankle" (05/04/2017)   Environmental and seasonal allergies 06/07/2015   Fibrosis of subtalar joint, 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   Mild cognitive impairment of uncertain or unknown etiology 09/11/2021   Mixed hyperlipidemia 06/14/2016   Pain in left ankle and joints of left foot 06/17/2017   Pain in rectum    Pneumonia 2017   PTSD (post-traumatic stress disorder) 01/30/2020   Slow transit constipation 04/02/2021   Urinary incontinence    Past Surgical History:  Procedure Laterality Date   ANKLE ARTHROSCOPY Left ~ 2013   "scraped out arthritis"   ANKLE ARTHROSCOPY WITH FUSION Left 09/22/2017   Procedure: LEFT ANKLE POSTERIOR ARTHROSCOPIC SUBTALAR ARTHRODESIS;  Surgeon: Newt Minion, MD;  Location: [facility];  Service: Orthopedics;  Laterality: Left;   ANKLE FRACTURE SURGERY Left 85/2778    APPLICATION OF WOUND VAC Right 24/23/5361   foot   APPLICATION OF WOUND VAC Right 05/03/2017   Procedure: APPLICATION OF WOUND VAC;  Surgeon: Marybelle Killings, MD;  Location: [facility];  Service: Orthopedics;  Laterality: Right;   FRACTURE SURGERY     I & D EXTREMITY Right 05/03/2017   foot   I & D EXTREMITY Right 05/03/2017   Procedure: IRRIGATION AND DEBRIDEMENT EXTREMITY RIGHT, REPAIR OF POSTERIOR TIBIAL TENDON;  Surgeon: Marybelle Killings, MD;  Location: [facility];  Service: Orthopedics;  Laterality: Right;   [place]?   POSTERIOR TIBIAL TENDON REPAIR Right 05/03/2017   SUBTALAR JOINT ARTHROEREISIS Left 1985?   TONSILLECTOMY     Patient Active Problem List   Diagnosis Date Noted   GERD (gastroesophageal reflux disease) 09/11/2021   Mild cognitive impairment of uncertain or unknown etiology 09/11/2021   Urinary incontinence    Pain in rectum    Major depressive disorder    History of concussion    Malignant melanoma of skin of back 04/16/2021   Slow transit constipation 04/02/2021   PTSD (post-traumatic stress disorder) 01/30/2020   Pain in left ankle and joints of left foot 06/17/2017   Fibrosis of subtalar joint, left 01/21/2017   Mixed hyperlipidemia 06/14/2016   Environmental and seasonal allergies 06/07/2015   HSV-2 (herpes simplex  virus 2) infection 06/07/2015   Generalized anxiety disorder 06/07/2015    REFERRING DIAG: Pain in left shoulder [M25.512], Bicipital tendinitis, left shoulder [M75.22]    THERAPY DIAG:  Acute pain of left shoulder  Muscle weakness (generalized)  Cramp and spasm  Abnormal posture  Rationale for Evaluation and Treatment Rehabilitation  PERTINENT HISTORY: Arthritis, Depression   PRECAUTIONS: None   SUBJECTIVE:                                                                                                                                                                                      SUBJECTIVE STATEMENT:  Pt states he  took his grandkids to [employer] lodge and was getting out of a raft when he felt a sharp pain.  He has been having increase impingement pain since this incident.     PAIN:  Are you having pain? Yes: NPRS scale: 4/10 Pain location: L shoulder  Pain description: Sharp  Aggravating factors: Lifting it overhead, sometimes when he hangs it by his side.  Relieving factors: Rest, ice.    OBJECTIVE:    DIAGNOSTIC FINDINGS:  Pt reports getting an X-ray with no signifcant findings.   PATIENT SURVEYS:  FOTO 54.09%, 65% in 10 visits.    COGNITION: Overall cognitive status: Within functional limits for tasks assessed                                     SENSATION: WFL   POSTURE: Rounded shoulders, and forward head.    UPPER EXTREMITY ROM:    Active ROM Right eval Left eval  Shoulder flexion WFL 180  Shoulder Abduction  WFL 115 P!  Shoulder Extension WFL 40 P!  Shoulder internal rotation [facility]  Shoulder external rotation [facility] WFL  (Blank rows = not tested)   UPPER EXTREMITY MMT:   MMT Right eval Left eval  Shoulder flexion 5/5 5/5  Shoulder internal rotation 5/5 5/5  Shoulder external rotation 5/5 5/5  (Blank rows = not tested)   SHOULDER SPECIAL TESTS: Impingement tests: Neer impingement test: negative and Hawkins/Kennedy impingement test: negative   JOINT MOBILITY TESTING:  Assess next session      PALPATION:  Tenderness at supraspinatus insertion.               TODAY'S TREATMENT: DATE: 10/27/2022: UBE 2.5 min fwd/ bkwd Pendulums x 20 each way: fwd/back, side to side, cw and ccw Prone shoulder ext, row with 1 lb 2 x 10, and horizontal 2 x 10 with 1 lb (patient had popping and pain with rowing- therefore held on  those) Side lying ER with 1 lb 2 x 10 Supine Serratus punch x 20 with 1 lb Supine shoulder alphabet with 1 lb A - Z Game ready shoulder pack x 10 min to left shoulder at end of session.  10/22/2022: UBE 2.5 min fwd/ bkwd 3 way scapular stabilization  x 10 left 4 D ball rolls x 20 each yellow plyo ball Prone shoulder ext, row with 2 lbs 2 x 10, and horizontal 2 x 10 with 2 lbs Side lying ER with 2 lbs 2 x 10 Supine Serratus punch x 20 with 2 lb Supine shoulder alphabet with 2 lb A - Z Trigger Point Dry-Needling  Treatment instructions: Expect mild to moderate muscle soreness. S/S of pneumothorax if dry needled over a lung field, and to seek immediate medical attention should they occur. Patient verbalized understanding of these instructions and education. Patient Consent Given: Yes Education handout provided: No Muscles treated: right UT (did in supine and prone due to multiple Tp's and twitch responses) Electrical stimulation performed: No Parameters: N/A Treatment response/outcome: Trigger points and taut bands identified in right upper trap by using skilled palpation.  Once identified, dry needling techniques performed.  Pt reports decreased tension,  multiple twitch responses in right upper trap noted with muscle elongation.  Patient has decreased impingement pain reports following treatment.    10/20/2022: UBE 2.5 min fwd/ bkwd Prone shoulder ext, row with 4 lbs 2 x 10, and horizontal 2 x 10 with 3 lbs Side lying ER with 3 lbs 2 x 10 Supine Serratus punch x 20 with 4 lb Supine shoulder alphabet with 4 lb A - Z Trigger Point Dry-Needling  Treatment instructions: Expect mild to moderate muscle soreness. S/S of pneumothorax if dry needled over a lung field, and to seek immediate medical attention should they occur. Patient verbalized understanding of these instructions and education. Patient Consent Given: Yes Education handout provided: No Muscles treated: L UT (did in supine and prone due to multiple Tp's and twitch responses, Left supraspinatus and infraspinatus Electrical stimulation performed: No Parameters: N/A Treatment response/outcome: Trigger points and taut bands identified in left upper trap, supraspinatus, and  infraspinatus by using skilled palpation.  Once identified, dry needling techniques performed.  Pt reports decreased tension,  multiple twitch responses in left upper trap noted with muscle elongation.  Patient has decreased impingement pain reports following treatment.    10/15/2022: UBE 2.5 min fwd/ bkwd Prone shoulder ext, row and horizontal 2 x 10 with 3 lbs Side lying ER with 3 lbs 2 x 10 Supine Serratus punch x 20 with 3 lb Supine shoulder alphabet with 3 lb A - Z Trigger Point Dry-Needling  Treatment instructions: Expect mild to moderate muscle soreness. S/S of pneumothorax if dry needled over a lung field, and to seek immediate medical attention should they occur. Patient verbalized understanding of these instructions and education. Patient Consent Given: Yes Education handout provided: No Muscles treated: L UT, Left supraspinatus and infraspinatus, left biceps Electrical stimulation performed: No Parameters: N/A Treatment response/outcome: Trigger points and taut bands identified in left upper trap, supraspinatus, infraspinatus and biceps by using skilled palpation.  Once identified, dry needling techniques performed.  Pt reports decreased tension,  multiple twitch responses in left upper trap noted with muscle elongation.  Patient has decreased impingement pain reports following treatment.       PATIENT EDUCATION: Education details: Educated pt on anatomy and physiology of current symptoms, FOTO, diagnosis, prognosis, HEP,  and POC. Person educated: Patient Education  method: Explanation and Demonstration Education comprehension: verbalized understanding and returned demonstration   HOME EXERCISE PROGRAM: Access Code: [email or web address] URL: [email or web address] Date: 10/22/2022 Prepared by: Candyce Churn  Exercises - Prone Shoulder Extension - Single Arm  - 2 x daily - 7 x weekly - 2 sets - 10 reps - Prone Shoulder Row  - 2 x daily - 7 x weekly - 2 sets - 10 reps -  Prone Single Arm Shoulder Horizontal Abduction with Scapular Retraction and Palm Down  - 2 x daily - 7 x weekly - 2 sets - 10 reps - Sidelying Shoulder External Rotation  - 2 x daily - 7 x weekly - 2 sets - 10 reps - Single Arm Serratus Punches in Supine with Dumbbell  - 2 x daily - 7 x weekly - 2 sets - 10 reps   ASSESSMENT:   CLINICAL IMPRESSION: Tom White had a set back after trying to get out of a raft.  He was able to tolerate today's activities but we had to decreased resistance and duration due to continue responded well.   He would benefit from continued skilled PT for left shoulder strengthening and scapular stabilization along with dry needling for symptom control.      OBJECTIVE IMPAIRMENTS: decreased activity tolerance, decreased shoulder mobility, decreased ROM, decreased strength, impaired flexibility, impaired UE use, postural dysfunction, and pain.   ACTIVITY LIMITATIONS: reaching, lifting, carry,  cleaning, driving, and or occupation   PERSONAL FACTORS: Arthritis, Depression also affecting patient's functional outcome.   REHAB POTENTIAL: Good   CLINICAL DECISION MAKING: Stable/uncomplicated   EVALUATION COMPLEXITY: Low       GOALS: Short term PT Goals Target date: 10/15/2022 Pt will be I and compliant with HEP. Baseline:  Goal status: New Pt will decrease pain by 25% overall Baseline: Goal status: New   Long term PT goals Target date: 11/19/2022 Pt will improve Rt shoulder AROM to [facility] to improve functional reaching Baseline: Goal status: New Pt will be able to perform recreational activities without reported pain.  Baseline:  Goal status:  Pt will improve FOTO to at least 65% functional to show improved function Baseline: Goal status: New Pt will reduce pain to overall less than <2/10 with usual activity and work activity. Baseline: Goal status: New   PLAN: PT FREQUENCY: 1-2x per week   PT DURATION: 8 weeks   PLANNED INTERVENTIONS (unless  contraindicated): aquatic PT, Canalith repositioning, cryotherapy, Electrical stimulation, Iontophoresis with 4 mg/ml dexamethasome, Moist heat, traction, Ultrasound, gait training, Therapeutic exercise, balance training, neuromuscular re-education, patient/family education, prosthetic training, manual techniques, passive ROM, dry needling, taping, vasopnuematic device, vestibular, spinal manipulations, joint manipulations   PLAN FOR NEXT SESSION: Strengthen parascapular muscles, manual as needed for pain and tension.    Anderson Malta B. Marlicia Sroka, PT 10/27/22 10:50 PM  [facility] [address] [place], [place] [address] Phone # [phone] Fax [phone]

## 2022-10-29 ENCOUNTER — Ambulatory Visit: Payer: Medicare Other

## 2022-10-29 DIAGNOSIS — R293 Abnormal posture: Secondary | ICD-10-CM

## 2022-10-29 DIAGNOSIS — M25512 Pain in left shoulder: Secondary | ICD-10-CM

## 2022-10-29 DIAGNOSIS — R252 Cramp and spasm: Secondary | ICD-10-CM

## 2022-10-29 DIAGNOSIS — M6281 Muscle weakness (generalized): Secondary | ICD-10-CM

## 2022-10-29 NOTE — Therapy (Signed)
OUTPATIENT PHYSICAL THERAPY TREATMENT NOTE   Patient Name: Tom White MRN: [id number] DOB:05/16/1961, 62 y.o., male Today's Date: 10/29/2022   REFERRING PROVIDER: Cassell Clement., MD   END OF SESSION:   PT End of Session - 10/29/22 1114     Visit Number 8    Number of Visits 16    Date for PT Re-Evaluation 11/25/22    Authorization Type [employer] MEDICARE    Progress Note Due on Visit 10    PT Start Time 1100    PT Stop Time 1145    PT Time Calculation (min) 45 min    Activity Tolerance Patient tolerated treatment well    Behavior During Therapy WFL for tasks assessed/performed              Past Medical History:  Diagnosis Date   Arthritis    "had it in my left ankle" (05/04/2017)   Environmental and seasonal allergies 06/07/2015   Fibrosis of subtalar joint, 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   Mild cognitive impairment of uncertain or unknown etiology 09/11/2021   Mixed hyperlipidemia 06/14/2016   Pain in left ankle and joints of left foot 06/17/2017   Pain in rectum    Pneumonia 2017   PTSD (post-traumatic stress disorder) 01/30/2020   Slow transit constipation 04/02/2021   Urinary incontinence    Past Surgical History:  Procedure Laterality Date   ANKLE ARTHROSCOPY Left ~ 2013   "scraped out arthritis"   ANKLE ARTHROSCOPY WITH FUSION Left 09/22/2017   Procedure: LEFT ANKLE POSTERIOR ARTHROSCOPIC SUBTALAR ARTHRODESIS;  Surgeon: Newt Minion, MD;  Location: [facility];  Service: Orthopedics;  Laterality: Left;   ANKLE FRACTURE SURGERY Left 35/5732    APPLICATION OF WOUND VAC Right 20/25/4270   foot   APPLICATION OF WOUND VAC Right 05/03/2017   Procedure: APPLICATION OF WOUND VAC;  Surgeon: Marybelle Killings, MD;  Location: [facility];  Service: Orthopedics;  Laterality: Right;   FRACTURE SURGERY     I & D EXTREMITY Right 05/03/2017   foot   I & D EXTREMITY Right 05/03/2017   Procedure: IRRIGATION AND DEBRIDEMENT EXTREMITY RIGHT, REPAIR OF POSTERIOR TIBIAL TENDON;  Surgeon: Marybelle Killings, MD;  Location: [facility];  Service: Orthopedics;  Laterality: Right;   [place]?   POSTERIOR TIBIAL TENDON REPAIR Right 05/03/2017   SUBTALAR JOINT ARTHROEREISIS Left 1985?   TONSILLECTOMY     Patient Active Problem List   Diagnosis Date Noted   GERD (gastroesophageal reflux disease) 09/11/2021   Mild cognitive impairment of uncertain or unknown etiology 09/11/2021   Urinary incontinence    Pain in rectum    Major depressive disorder    History of concussion    Malignant melanoma of skin of back 04/16/2021   Slow transit constipation 04/02/2021   PTSD (post-traumatic stress disorder) 01/30/2020   Pain in left ankle and joints of left foot 06/17/2017   Fibrosis of subtalar joint, left 01/21/2017   Mixed hyperlipidemia 06/14/2016   Environmental and seasonal allergies 06/07/2015   HSV-2 (herpes simplex  virus 2) infection 06/07/2015   Generalized anxiety disorder 06/07/2015    REFERRING DIAG: Pain in left shoulder [M25.512], Bicipital tendinitis, left shoulder [M75.22]    THERAPY DIAG:  Acute pain of left shoulder  Muscle weakness (generalized)  Cramp and spasm  Abnormal posture  Rationale for Evaluation and Treatment Rehabilitation  PERTINENT HISTORY: Arthritis, Depression   PRECAUTIONS: None   SUBJECTIVE:                                                                                                                                                                                      SUBJECTIVE STATEMENT:  "It still  hurts like hell"   PAIN:  Are you having pain? Yes: NPRS scale: 4/10 Pain location: L shoulder  Pain description: Sharp  Aggravating factors: Lifting it overhead, sometimes when he hangs it by his side.  Relieving factors: Rest, ice.    OBJECTIVE:    DIAGNOSTIC FINDINGS:  Pt reports getting an X-ray with no signifcant findings.   PATIENT SURVEYS:  FOTO 54.09%, 65% in 10 visits.    COGNITION: Overall cognitive status: Within functional limits for tasks assessed                                     SENSATION: WFL   POSTURE: Rounded shoulders, and forward head.    UPPER EXTREMITY ROM:    Active ROM Right eval Left eval  Shoulder flexion WFL 180  Shoulder Abduction  WFL 115 P!  Shoulder Extension WFL 40 P!  Shoulder internal rotation [facility]  Shoulder external rotation [facility] WFL  (Blank rows = not tested)   UPPER EXTREMITY MMT:   MMT Right eval Left eval  Shoulder flexion 5/5 5/5  Shoulder internal rotation 5/5 5/5  Shoulder external rotation 5/5 5/5  (Blank rows = not tested)   SHOULDER SPECIAL TESTS: Impingement tests: Neer impingement test: negative and Hawkins/Kennedy impingement test: negative   JOINT MOBILITY TESTING:  Assess next session      PALPATION:  Tenderness at supraspinatus insertion.               TODAY'S TREATMENT:  10/29/2022: UBE 2.5 min fwd/ bkwd 3 way scapular stabilization with green loop x 10 Left 4 D ball rolls with red plyo ball x 20 each direction Prone shoulder ext, row with 3 lb 2 x 10, and horizontal 2 x 10 with 2 lb (patient had popping and pain with rowing again today but had patient lower his range and keep moving) Side lying ER with 2 lb 2 x 10 Supine Serratus punch x  20 with 2 lb Supine shoulder alphabet with 2 lb A - Z Trigger Point Dry-Needling  Treatment instructions: Expect mild to moderate muscle soreness. S/S of pneumothorax if dry needled over a lung field, and to seek immediate medical attention should they occur.  Patient verbalized understanding of these instructions and education. Patient Consent Given: Yes Education handout provided: No Muscles treated: right UT (did in supine and prone due to multiple Tp's and twitch responses), prone position we did supraspinatus and infraspinatus  Electrical stimulation performed: No Parameters: N/A Treatment response/outcome: Trigger points and taut bands identified in right upper trap by using skilled palpation.  Once identified, dry needling techniques performed.  Pt reports decreased tension,  multiple twitch responses in right upper trap noted with muscle elongation.  Patient has decreased impingement pain reports following treatment.  10/27/2022: UBE 2.5 min fwd/ bkwd Pendulums x 20 each way: fwd/back, side to side, cw and ccw Prone shoulder ext, row with 1 lb 2 x 10, and horizontal 2 x 10 with 1 lb (patient had popping and pain with rowing- therefore held on those) Side lying ER with 1 lb 2 x 10 Supine Serratus punch x 20 with 1 lb Supine shoulder alphabet with 1 lb A - Z Game ready shoulder pack x 10 min to left shoulder at end of session. Trigger Point Dry-Needling  Treatment instructions: Expect mild to moderate muscle soreness. S/S of pneumothorax if dry needled over a lung field, and to seek immediate medical attention should they occur. Patient verbalized understanding of these instructions and education. Patient Consent Given: Yes Education handout provided: No Muscles treated: right UT (did in supine and prone due to multiple Tp's and twitch responses), prone position we did supraspinatus and infraspinatus along with cervical paraspinals Electrical stimulation performed: No Parameters: N/A Treatment response/outcome: Trigger points and taut bands identified in right upper trap by using skilled palpation.  Once identified, dry needling techniques performed.  Pt reports decreased tension,  multiple twitch responses in right upper trap noted with  muscle elongation.  Patient has decreased impingement pain reports following treatment.  10/22/2022: UBE 2.5 min fwd/ bkwd 3 way scapular stabilization x 10 left 4 D ball rolls x 20 each yellow plyo ball Prone shoulder ext, row with 2 lbs 2 x 10, and horizontal 2 x 10 with 2 lbs Side lying ER with 2 lbs 2 x 10 Supine Serratus punch x 20 with 2 lb Supine shoulder alphabet with 2 lb A - Z Trigger Point Dry-Needling  Treatment instructions: Expect mild to moderate muscle soreness. S/S of pneumothorax if dry needled over a lung field, and to seek immediate medical attention should they occur. Patient verbalized understanding of these instructions and education. Patient Consent Given: Yes Education handout provided: No Muscles treated: right UT (did in supine and prone due to multiple Tp's and twitch responses) Electrical stimulation performed: No Parameters: N/A Treatment response/outcome: Trigger points and taut bands identified in right upper trap by using skilled palpation.  Once identified, dry needling techniques performed.  Pt reports decreased tension,  multiple twitch responses in right upper trap noted with muscle elongation.  Patient has decreased impingement pain reports following treatment.        PATIENT EDUCATION: Education details: Educated pt on anatomy and physiology of current symptoms, FOTO, diagnosis, prognosis, HEP,  and POC. Person educated: Patient Education method: Customer service manager Education comprehension: verbalized understanding and returned demonstration   HOME EXERCISE PROGRAM: Access Code: [email or web address] URL: [email or web address] Date: 10/22/2022 Prepared by:  Candyce Churn  Exercises - Prone Shoulder Extension - Single Arm  - 2 x daily - 7 x weekly - 2 sets - 10 reps - Prone Shoulder Row  - 2 x daily - 7 x weekly - 2 sets - 10 reps - Prone Single Arm Shoulder Horizontal Abduction with Scapular Retraction and Palm Down  - 2 x daily  - 7 x weekly - 2 sets - 10 reps - Sidelying Shoulder External Rotation  - 2 x daily - 7 x weekly - 2 sets - 10 reps - Single Arm Serratus Punches in Supine with Dumbbell  - 2 x daily - 7 x weekly - 2 sets - 10 reps   ASSESSMENT:   CLINICAL IMPRESSION: Holland is still having elevated pain since his last injury.  He is struggling through all exercises but does get some relief from the neck pain with the dry needling.  PT explained that he likely needs an injection or possibly surgery by observing his symptoms.  He would like to keep doing PT for a few more visits but will go ahead and schedule a follow up with MD.  We will continue PT until patient is able to see MD to control pain and symptoms.      OBJECTIVE IMPAIRMENTS: decreased activity tolerance, decreased shoulder mobility, decreased ROM, decreased strength, impaired flexibility, impaired UE use, postural dysfunction, and pain.   ACTIVITY LIMITATIONS: reaching, lifting, carry,  cleaning, driving, and or occupation   PERSONAL FACTORS: Arthritis, Depression also affecting patient's functional outcome.   REHAB POTENTIAL: Good   CLINICAL DECISION MAKING: Stable/uncomplicated   EVALUATION COMPLEXITY: Low       GOALS: Short term PT Goals Target date: 10/15/2022 Pt will be I and compliant with HEP. Baseline:  Goal status: New Pt will decrease pain by 25% overall Baseline: Goal status: New   Long term PT goals Target date: 11/19/2022 Pt will improve Rt shoulder AROM to [facility] to improve functional reaching Baseline: Goal status: New Pt will be able to perform recreational activities without reported pain.  Baseline:  Goal status:  Pt will improve FOTO to at least 65% functional to show improved function Baseline: Goal status: New Pt will reduce pain to overall less than <2/10 with usual activity and work activity. Baseline: Goal status: New   PLAN: PT FREQUENCY: 1-2x per week   PT DURATION: 8 weeks   PLANNED INTERVENTIONS  (unless contraindicated): aquatic PT, Canalith repositioning, cryotherapy, Electrical stimulation, Iontophoresis with 4 mg/ml dexamethasome, Moist heat, traction, Ultrasound, gait training, Therapeutic exercise, balance training, neuromuscular re-education, patient/family education, prosthetic training, manual techniques, passive ROM, dry needling, taping, vasopnuematic device, vestibular, spinal manipulations, joint manipulations   PLAN FOR NEXT SESSION: Strengthen parascapular muscles, manual as needed for pain and tension.    Anderson Malta B. Cattaleya Wien, PT 10/29/22 1:24 PM  [facility] [address], [address] [place], [place] [address] Phone # [phone] Fax [phone]

## 2022-11-02 NOTE — Therapy (Unsigned)
OUTPATIENT PHYSICAL THERAPY TREATMENT NOTE   Patient Name: Tom White MRN: [id number] DOB:01-02-1961, 62 y.o., male Today's Date: 11/03/2022   REFERRING PROVIDER: Cassell Clement., MD   END OF SESSION:   PT End of Session - 11/03/22 0928     Visit Number 9    Number of Visits 16    Date for PT Re-Evaluation 11/25/22    Authorization Type [employer] MEDICARE    Progress Note Due on Visit 10    PT Start Time [phone]    PT Stop Time 0923    PT Time Calculation (min) 40 min    Activity Tolerance Patient tolerated treatment well    Behavior During Therapy [facility] for tasks assessed/performed               Past Medical History:  Diagnosis Date   Arthritis    "had it in my left ankle" (05/04/2017)   Environmental and seasonal allergies 06/07/2015   Fibrosis of subtalar joint, 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   Mild cognitive impairment of uncertain or unknown etiology 09/11/2021   Mixed hyperlipidemia 06/14/2016   Pain in left ankle and joints of left foot 06/17/2017   Pain in rectum    Pneumonia 2017   PTSD (post-traumatic stress disorder) 01/30/2020   Slow transit constipation 04/02/2021   Urinary incontinence    Past Surgical History:  Procedure Laterality Date   ANKLE ARTHROSCOPY Left ~ 2013   "scraped out arthritis"   ANKLE ARTHROSCOPY WITH FUSION Left 09/22/2017   Procedure: LEFT ANKLE POSTERIOR ARTHROSCOPIC SUBTALAR ARTHRODESIS;  Surgeon: Newt Minion, MD;  Location: [place];  Service: Orthopedics;  Laterality: Left;   ANKLE FRACTURE SURGERY Left 37/6283    APPLICATION OF WOUND VAC Right 15/17/6160   foot   APPLICATION OF WOUND VAC Right 05/03/2017   Procedure: APPLICATION OF WOUND VAC;  Surgeon: Marybelle Killings, MD;  Location: [place];  Service: Orthopedics;  Laterality: Right;   FRACTURE SURGERY     I & D EXTREMITY Right 05/03/2017   foot   I & D EXTREMITY Right 05/03/2017   Procedure: IRRIGATION AND DEBRIDEMENT EXTREMITY RIGHT, REPAIR OF POSTERIOR TIBIAL TENDON;  Surgeon: Marybelle Killings, MD;  Location: [place];  Service: Orthopedics;  Laterality: Right;   [place]?   POSTERIOR TIBIAL TENDON REPAIR Right 05/03/2017   SUBTALAR JOINT ARTHROEREISIS Left 1985?   TONSILLECTOMY     Patient Active Problem List   Diagnosis Date Noted   GERD (gastroesophageal reflux disease) 09/11/2021   Mild cognitive impairment of uncertain or unknown etiology 09/11/2021   Urinary incontinence    Pain in rectum    Major depressive disorder    History of concussion    Malignant melanoma of skin of back 04/16/2021   Slow transit constipation 04/02/2021   PTSD (post-traumatic stress disorder) 01/30/2020   Pain in left ankle and joints of left foot 06/17/2017   Fibrosis of subtalar joint, left 01/21/2017   Mixed hyperlipidemia 06/14/2016   Environmental and seasonal allergies 06/07/2015   HSV-2 (herpes  simplex virus 2) infection 06/07/2015   Generalized anxiety disorder 06/07/2015    REFERRING DIAG: Pain in left shoulder [M25.512], Bicipital tendinitis, left shoulder [M75.22]    THERAPY DIAG:  Acute pain of left shoulder  Cramp and spasm  Muscle weakness (generalized)  Abnormal posture  Rationale for Evaluation and Treatment Rehabilitation  PERTINENT HISTORY: Arthritis, Depression   PRECAUTIONS: None   SUBJECTIVE:                                                                                                                                                                                      SUBJECTIVE STATEMENT:  "My shoulder  still hurts, I have an MRI scheduled on Thursday".    PAIN:  Are you having pain? Yes: NPRS scale: 4/10 Pain location: L shoulder  Pain description: Sharp  Aggravating factors: Lifting it overhead, sometimes when he hangs it by his side.  Relieving factors: Rest, ice.    OBJECTIVE:    DIAGNOSTIC FINDINGS:  Pt reports getting an X-ray with no signifcant findings.   PATIENT SURVEYS:  FOTO 54.09%, 65% in 10 visits.    COGNITION: Overall cognitive status: Within functional limits for tasks assessed                                     SENSATION: WFL   POSTURE: Rounded shoulders, and forward head.    UPPER EXTREMITY ROM:    Active ROM Right eval Left eval  Shoulder flexion WFL 180  Shoulder Abduction  WFL 115 P!  Shoulder Extension WFL 40 P!  Shoulder internal rotation [facility]  Shoulder external rotation [facility] WFL  (Blank rows = not tested)   UPPER EXTREMITY MMT:   MMT Right eval Left eval  Shoulder flexion 5/5 5/5  Shoulder internal rotation 5/5 5/5  Shoulder external rotation 5/5 5/5  (Blank rows = not tested)   SHOULDER SPECIAL TESTS: Impingement tests: Neer impingement test: negative and Hawkins/Kennedy impingement test: negative   JOINT MOBILITY TESTING:  Assess next session      PALPATION:  Tenderness at supraspinatus insertion.               TODAY'S TREATMENT:  11/03/2022 UBE 2.5 min fwd/ bkwd Seated scap retraction x 20 Trigger Point Dry-Needling  Treatment instructions: Expect mild to moderate muscle soreness. S/S of pneumothorax if dry needled over a lung field, and to seek immediate medical attention should they occur. Patient verbalized understanding of these instructions and education. Patient Consent Given: Yes Education handout provided: No Muscles treated: right UT (did in supine and prone due to  multiple Tp's and twitch responses), L biceps long head. Electrical stimulation performed: No Parameters: N/A Treatment response/outcome: Trigger  points and taut bands identified in right upper trap by using skilled palpation.  Once identified, dry needling techniques performed.  Pt reports decreased tension,  multiple twitch responses in right upper trap noted with muscle elongation.  Patient has decreased impingement pain reports following treatment. STM to biceps prior to dry needling with significant tension noted at proximal end Moist heat for 5 min to help with pain modulation.   10/29/2022: UBE 2.5 min fwd/ bkwd 3 way scapular stabilization with green loop x 10 Left 4 D ball rolls with red plyo ball x 20 each direction Prone shoulder ext, row with 3 lb 2 x 10, and horizontal 2 x 10 with 2 lb (patient had popping and pain with rowing again today but had patient lower his range and keep moving) Side lying ER with 2 lb 2 x 10 Supine Serratus punch x 20 with 2 lb Supine shoulder alphabet with 2 lb A - Z Trigger Point Dry-Needling  Treatment instructions: Expect mild to moderate muscle soreness. S/S of pneumothorax if dry needled over a lung field, and to seek immediate medical attention should they occur. Patient verbalized understanding of these instructions and education. Patient Consent Given: Yes Education handout provided: No Muscles treated: right UT (did in supine and prone due to multiple Tp's and twitch responses), prone position we did supraspinatus and infraspinatus  Electrical stimulation performed: No Parameters: N/A Treatment response/outcome: Trigger points and taut bands identified in right upper trap by using skilled palpation.  Once identified, dry needling techniques performed.  Pt reports decreased tension,  multiple twitch responses in right upper trap noted with muscle elongation.  Patient has decreased impingement pain reports following treatment.  10/27/2022: UBE 2.5 min fwd/ bkwd Pendulums x 20 each way: fwd/back, side to side, cw and ccw Prone shoulder ext, row with 1 lb 2 x 10, and horizontal 2 x 10 with 1  lb (patient had popping and pain with rowing- therefore held on those) Side lying ER with 1 lb 2 x 10 Supine Serratus punch x 20 with 1 lb Supine shoulder alphabet with 1 lb A - Z Game ready shoulder pack x 10 min to left shoulder at end of session. Trigger Point Dry-Needling  Treatment instructions: Expect mild to moderate muscle soreness. S/S of pneumothorax if dry needled over a lung field, and to seek immediate medical attention should they occur. Patient verbalized understanding of these instructions and education. Patient Consent Given: Yes Education handout provided: No Muscles treated: right UT (did in supine and prone due to multiple Tp's and twitch responses), prone position we did supraspinatus and infraspinatus along with cervical paraspinals Electrical stimulation performed: No Parameters: N/A Treatment response/outcome: Trigger points and taut bands identified in right upper trap by using skilled palpation.  Once identified, dry needling techniques performed.  Pt reports decreased tension,  multiple twitch responses in right upper trap noted with muscle elongation.  Patient has decreased impingement pain reports following treatment.  PATIENT EDUCATION: Education details: Educated pt on anatomy and physiology of current symptoms, FOTO, diagnosis, prognosis, HEP,  and POC. Person educated: Patient Education method: Customer service manager Education comprehension: verbalized understanding and returned demonstration   HOME EXERCISE PROGRAM: Access Code: [email or web address] URL: [email or web address] Date: 10/22/2022 Prepared by: Candyce Churn  Exercises - Prone Shoulder Extension - Single Arm  - 2 x daily - 7 x weekly - 2 sets - 10  reps - Prone Shoulder Row  - 2 x daily - 7 x weekly - 2 sets - 10 reps - Prone Single Arm Shoulder Horizontal Abduction with Scapular Retraction and Palm Down  - 2 x daily - 7 x weekly - 2 sets - 10 reps - Sidelying Shoulder External  Rotation  - 2 x daily - 7 x weekly - 2 sets - 10 reps - Single Arm Serratus Punches in Supine with Dumbbell  - 2 x daily - 7 x weekly - 2 sets - 10 reps   ASSESSMENT:   CLINICAL IMPRESSION: Zyler is still having elevated pain since his last injury.  Due to pain relief with TPDN, DN performed to L biceps and L UT with pt reporting significant achiness in his L biceps following. Pain reduced with STM and moist heat. Pt advised to take ibuprofen and drink water to help modulate tenderness. Continued discussion of potential debridement to L shoulder, pt is scheduled to get an MRI on Thursday. We will continue PT until patient is able to see MD to control pain and symptoms.      OBJECTIVE IMPAIRMENTS: decreased activity tolerance, decreased shoulder mobility, decreased ROM, decreased strength, impaired flexibility, impaired UE use, postural dysfunction, and pain.   ACTIVITY LIMITATIONS: reaching, lifting, carry,  cleaning, driving, and or occupation   PERSONAL FACTORS: Arthritis, Depression also affecting patient's functional outcome.   REHAB POTENTIAL: Good   CLINICAL DECISION MAKING: Stable/uncomplicated   EVALUATION COMPLEXITY: Low       GOALS: Short term PT Goals Target date: 10/15/2022 Pt will be I and compliant with HEP. Baseline:  Goal status: New Pt will decrease pain by 25% overall Baseline: Goal status: New   Long term PT goals Target date: 11/19/2022 Pt will improve Rt shoulder AROM to [facility] to improve functional reaching Baseline: Goal status: New Pt will be able to perform recreational activities without reported pain.  Baseline:  Goal status:  Pt will improve FOTO to at least 65% functional to show improved function Baseline: Goal status: New Pt will reduce pain to overall less than <2/10 with usual activity and work activity. Baseline: Goal status: New   PLAN: PT FREQUENCY: 1-2x per week   PT DURATION: 8 weeks   PLANNED INTERVENTIONS (unless contraindicated):  aquatic PT, Canalith repositioning, cryotherapy, Electrical stimulation, Iontophoresis with 4 mg/ml dexamethasome, Moist heat, traction, Ultrasound, gait training, Therapeutic exercise, balance training, neuromuscular re-education, patient/family education, prosthetic training, manual techniques, passive ROM, dry needling, taping, vasopnuematic device, vestibular, spinal manipulations, joint manipulations   PLAN FOR NEXT SESSION: Strengthen parascapular muscles, manual as needed for pain and tension.    Rudi Heap PT, DPT 11/03/22  11:07 AM

## 2022-11-03 ENCOUNTER — Encounter: Payer: Self-pay | Admitting: Physical Therapy

## 2022-11-03 ENCOUNTER — Ambulatory Visit: Payer: Medicare Other | Admitting: Physical Therapy

## 2022-11-03 DIAGNOSIS — R293 Abnormal posture: Secondary | ICD-10-CM | POA: Diagnosis not present

## 2022-11-03 DIAGNOSIS — R252 Cramp and spasm: Secondary | ICD-10-CM

## 2022-11-03 DIAGNOSIS — M25512 Pain in left shoulder: Secondary | ICD-10-CM

## 2022-11-03 DIAGNOSIS — M6281 Muscle weakness (generalized): Secondary | ICD-10-CM

## 2022-11-03 NOTE — Therapy (Unsigned)
OUTPATIENT PHYSICAL THERAPY TREATMENT NOTE   Patient Name: Tom White MRN: [id number] DOB:Jun 13, 1961, 62 y.o., male Today's Date: 11/05/2022   REFERRING PROVIDER: Cassell Clement., MD   END OF SESSION:   PT End of Session - 11/05/22 1253     Visit Number 10    Number of Visits 16    Date for PT Re-Evaluation 11/25/22    Authorization Type [employer] MEDICARE    Progress Note Due on Visit 10    PT Start Time 1231    PT Stop Time 1315    PT Time Calculation (min) 44 min    Activity Tolerance Patient tolerated treatment well    Behavior During Therapy [facility] for tasks assessed/performed            Progress Note Reporting Period 09/24/2022 to 11/05/2022  See note below for Objective Data and Assessment of Progress/Goals.   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   Mild cognitive impairment of uncertain or unknown etiology 09/11/2021   Mixed hyperlipidemia 06/14/2016   Pain in left ankle and joints of left foot 06/17/2017   Pain in rectum    Pneumonia 2017   PTSD (post-traumatic stress disorder) 01/30/2020   Slow transit constipation 04/02/2021   Urinary incontinence    Past Surgical History:  Procedure Laterality Date   ANKLE ARTHROSCOPY Left ~ 2013   "scraped out arthritis"   ANKLE ARTHROSCOPY WITH FUSION Left 09/22/2017   Procedure: LEFT ANKLE POSTERIOR ARTHROSCOPIC SUBTALAR ARTHRODESIS;  Surgeon:  Newt Minion, MD;  Location: [place];  Service: Orthopedics;  Laterality: Left;   ANKLE FRACTURE SURGERY Left 74/0814   APPLICATION OF WOUND VAC Right 48/18/5631   foot   APPLICATION OF WOUND VAC Right 05/03/2017   Procedure: APPLICATION OF WOUND VAC;  Surgeon: Marybelle Killings, MD;  Location: [place];  Service: Orthopedics;  Laterality: Right;   FRACTURE SURGERY     I & D EXTREMITY Right 05/03/2017   foot   I & D EXTREMITY Right 05/03/2017   Procedure: IRRIGATION AND DEBRIDEMENT EXTREMITY RIGHT, REPAIR OF POSTERIOR TIBIAL TENDON;  Surgeon: Marybelle Killings, MD;  Location: [place];  Service: Orthopedics;  Laterality: Right;   [place]?   POSTERIOR TIBIAL TENDON REPAIR Right 05/03/2017   SUBTALAR JOINT ARTHROEREISIS Left 1985?   TONSILLECTOMY     Patient Active Problem List   Diagnosis Date Noted   GERD (gastroesophageal reflux disease) 09/11/2021   Mild cognitive impairment of uncertain or unknown etiology 09/11/2021   Urinary incontinence    Pain in rectum    Major depressive disorder    History of concussion    Malignant melanoma of skin of back 04/16/2021   Slow transit constipation 04/02/2021   PTSD (post-traumatic stress disorder) 01/30/2020   Pain in left ankle and joints of left foot 06/17/2017   Fibrosis of subtalar joint, left  01/21/2017   Mixed hyperlipidemia 06/14/2016   Environmental and seasonal allergies 06/07/2015   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   Generalized anxiety disorder 06/07/2015    REFERRING DIAG: Pain in left shoulder [M25.512], Bicipital tendinitis, left shoulder [M75.22]    THERAPY DIAG:  Acute pain of left shoulder  Muscle weakness (generalized)  Cramp and spasm  Abnormal posture  Rationale for Evaluation and Treatment Rehabilitation  PERTINENT HISTORY: Arthritis, Depression   PRECAUTIONS: None   SUBJECTIVE:                                                                                                                                                                                       SUBJECTIVE STATEMENT:  "I fell asleep during my MRI this morning"    PAIN:  Are you having pain? Yes: NPRS scale: 4/10 Pain location: L shoulder  Pain description: Sharp  Aggravating factors: Lifting it overhead, sometimes when he hangs it by his side.  Relieving factors: Rest, ice.    OBJECTIVE:    DIAGNOSTIC FINDINGS:  Pt reports getting an X-ray with no signifcant findings.   PATIENT SURVEYS:  FOTO 54.09%, 65% in 10 visits.    COGNITION: Overall cognitive status: Within functional limits for tasks assessed                                     SENSATION: WFL   POSTURE: Rounded shoulders, and forward head.    UPPER EXTREMITY ROM:    Active ROM Right eval Left eval L 11/05/2022  Shoulder flexion WFL 180 135 P!  Shoulder Abduction  WFL 115 P! 105 P!  Shoulder Extension WFL 40 P! 58 P!  Shoulder internal rotation [facility] WFL with significant  pain  Shoulder external rotation [facility] WFL with significant  pain  (Blank rows = not tested)   UPPER EXTREMITY MMT:   MMT Right eval Left eval L 11/05/2022  Shoulder flexion 5/5 5/5 NT due to pain level 7/10  Shoulder internal rotation 5/5 5/5 NT due to pain level 7/10  Shoulder external rotation 5/5 5/5 NT due to pain level 7/10  (Blank rows = not tested)   SHOULDER SPECIAL TESTS: Impingement tests: Neer impingement test: negative and Hawkins/Kennedy impingement test: negative  02/01/2024Lift off: positive    JOINT MOBILITY TESTING:  Assess next session      PALPATION:  Tenderness at supraspinatus insertion.               TODAY'S TREATMENT:  11/05/2022 UBE 2 min fwd/ bkwd 3 way scapular stabilization with blue  loop x 10 Left 4 D ball rolls with red plo ball x 20 each direction Supine Serratus punch x 20 with  Supine shoulder alphabet with 2 lb A - Z Addaday to L UT Supine horizontal abduction Reassessment of shoulder for  progress note Ice for 10 min at end of session  11/05/2022 UBE 2.5 min fwd/ bkwd Seated scap retraction x 20 Trigger Point Dry-Needling  Treatment instructions: Expect mild to moderate muscle soreness. S/S of pneumothorax if dry needled over a lung field, and to seek immediate medical attention should they occur. Patient verbalized understanding of these instructions and education. Patient Consent Given: Yes Education handout provided: No Muscles treated: right UT (did in supine and prone due to multiple Tp's and twitch responses), L biceps long head. Electrical stimulation performed: No Parameters: N/A Treatment response/outcome: Trigger points and taut bands identified in right upper trap by using skilled palpation.  Once identified, dry needling techniques performed.  Pt reports decreased tension,  multiple twitch responses in right upper trap noted with muscle elongation.  Patient has decreased impingement pain reports following treatment. STM to biceps prior to dry needling with significant tension noted at proximal end Moist heat for 5 min to help with pain modulation.   10/29/2022: UBE 2.5 min fwd/ bkwd 3 way scapular stabilization with green loop x 10 Left 4 D ball rolls with red plyo ball x 20 each direction Prone shoulder ext, row with 3 lb 2 x 10, and horizontal 2 x 10 with 2 lb (patient had popping and pain with rowing again today but had patient lower his range and keep moving) Side lying ER with 2 lb 2 x 10 Supine Serratus punch x 20 with 2 lb Supine shoulder alphabet with 2 lb A - Z Trigger Point Dry-Needling  Treatment instructions: Expect mild to moderate muscle soreness. S/S of pneumothorax if dry needled over a lung field, and to seek immediate medical attention should they occur. Patient verbalized understanding of these instructions and education. Patient Consent Given: Yes Education handout provided: No Muscles treated: right UT (did in supine and prone due to  multiple Tp's and twitch responses), prone position we did supraspinatus and infraspinatus  Electrical stimulation performed: No Parameters: N/A Treatment response/outcome: Trigger points and taut bands identified in right upper trap by using skilled palpation.  Once identified, dry needling techniques performed.  Pt reports decreased tension,  multiple twitch responses in right upper trap noted with muscle elongation.  Patient has decreased impingement pain reports following treatment.  10/27/2022: UBE 2.5 min fwd/ bkwd Pendulums x 20 each way: fwd/back, side to side, cw and ccw Prone shoulder ext, row with 1 lb 2 x 10, and horizontal 2 x 10 with 1 lb (patient had popping and pain with rowing- therefore held on those) Side lying ER with 1 lb 2 x 10 Supine Serratus punch x 20 with 1 lb Supine shoulder alphabet with 1 lb A - Z Game ready shoulder pack x 10 min to left shoulder at end of session. Trigger Point Dry-Needling  Treatment instructions: Expect mild to moderate muscle soreness. S/S of pneumothorax if dry needled over a lung field, and to seek immediate medical attention should they occur. Patient verbalized understanding of these instructions and education. Patient Consent Given: Yes Education handout provided: No Muscles treated: right UT (did in supine and prone due to multiple Tp's and twitch responses), prone position we did supraspinatus and infraspinatus along with cervical paraspinals Electrical stimulation performed: No Parameters: N/A Treatment  response/outcome: Trigger points and taut bands identified in right upper trap by using skilled palpation.  Once identified, dry needling techniques performed.  Pt reports decreased tension,  multiple twitch responses in right upper trap noted with muscle elongation.  Patient has decreased impingement pain reports following treatment.  PATIENT EDUCATION: Education details: Educated pt on anatomy and physiology of current symptoms, FOTO,  diagnosis, prognosis, HEP,  and POC. Person educated: Patient Education method: Customer service manager Education comprehension: verbalized understanding and returned demonstration   HOME EXERCISE PROGRAM: Access Code: [email or web address] URL: [email or web address] Date: 10/22/2022 Prepared by: Candyce Churn  Exercises - Prone Shoulder Extension - Single Arm  - 2 x daily - 7 x weekly - 2 sets - 10 reps - Prone Shoulder Row  - 2 x daily - 7 x weekly - 2 sets - 10 reps - Prone Single Arm Shoulder Horizontal Abduction with Scapular Retraction and Palm Down  - 2 x daily - 7 x weekly - 2 sets - 10 reps - Sidelying Shoulder External Rotation  - 2 x daily - 7 x weekly - 2 sets - 10 reps - Single Arm Serratus Punches in Supine with Dumbbell  - 2 x daily - 7 x weekly - 2 sets - 10 reps   ASSESSMENT:   CLINICAL IMPRESSION: Inocente Krach has progressed fair with therapy. Despite being active with PT and his HEP, pt continues to demonstrate continued deficits and pain in his L shoulder. Since eval, pt demonstrates decreased functional ROM and strength. His tolerance to activity and strengthening has also decreased secondary to pain. Discussed possibility of cortisone shot wearing off over his duration of PT along with structural damage to supra/ infra and labrum that could be causing possible irritation and inflammation. Pt received an MRI this morning to further investigate L shoulder. He would like to continue with PT until update on L shoulder from MD to maintain current level of function. Please see GOALS section for progress on short term and long term goals established at evaluation. I recommend continued PT until further assessment of L shoulder from MD; pt agrees with plan.    OBJECTIVE IMPAIRMENTS: decreased activity tolerance, decreased shoulder mobility, decreased ROM, decreased strength, impaired flexibility, impaired UE use, postural dysfunction, and pain.   ACTIVITY LIMITATIONS:  reaching, lifting, carry,  cleaning, driving, and or occupation   PERSONAL FACTORS: Arthritis, Depression also affecting patient's functional outcome.   REHAB POTENTIAL: Good   CLINICAL DECISION MAKING: Stable/uncomplicated   EVALUATION COMPLEXITY: Low       GOALS: Short term PT Goals Target date: 10/15/2022 Pt will be I and compliant with HEP. Baseline:  Goal status: MET  Pt will decrease pain by 25% overall Baseline: Goal status: NOT MET    Long term PT goals Target date: 11/19/2022 Pt will improve Rt shoulder AROM to [facility] to improve functional reaching Baseline: Goal status: NOT MET secondary to pain.  Pt will be able to perform recreational activities without reported pain.  Baseline:  Goal status: NOT MET secondary to pain.  Pt will improve FOTO to at least 65% functional to show improved function Baseline: Goal status:  Pt will reduce pain to overall less than <2/10 with usual activity and work activity. Baseline: Goal status: NOT MET 7/10 today.    PLAN: PT FREQUENCY: 1-2x per week   PT DURATION: 8 weeks   PLANNED INTERVENTIONS (unless contraindicated): aquatic PT, Canalith repositioning, cryotherapy, Electrical stimulation, Iontophoresis with 4 mg/ml dexamethasome, Moist heat, traction, Ultrasound, gait training, Therapeutic  exercise, balance training, neuromuscular re-education, patient/family education, prosthetic training, manual techniques, passive ROM, dry needling, taping, vasopnuematic device, vestibular, spinal manipulations, joint manipulations   PLAN FOR NEXT SESSION: Strengthen parascapular muscles, manual as needed for pain and tension.    Rudi Heap PT, DPT 11/05/22  3:36 PM

## 2022-11-05 ENCOUNTER — Ambulatory Visit: Payer: Medicare Other | Attending: Orthopaedic Surgery | Admitting: Physical Therapy

## 2022-11-05 DIAGNOSIS — M6281 Muscle weakness (generalized): Secondary | ICD-10-CM | POA: Insufficient documentation

## 2022-11-05 DIAGNOSIS — R252 Cramp and spasm: Secondary | ICD-10-CM | POA: Diagnosis present

## 2022-11-05 DIAGNOSIS — R293 Abnormal posture: Secondary | ICD-10-CM | POA: Diagnosis present

## 2022-11-05 DIAGNOSIS — M25512 Pain in left shoulder: Secondary | ICD-10-CM | POA: Insufficient documentation

## 2022-11-10 ENCOUNTER — Ambulatory Visit: Payer: Medicare Other | Admitting: Physical Therapy

## 2022-11-12 ENCOUNTER — Encounter: Payer: Medicare Other | Admitting: Physical Therapy

## 2022-11-17 ENCOUNTER — Ambulatory Visit: Payer: Medicare Other

## 2022-11-17 DIAGNOSIS — M25512 Pain in left shoulder: Secondary | ICD-10-CM | POA: Diagnosis not present

## 2022-11-17 DIAGNOSIS — M6281 Muscle weakness (generalized): Secondary | ICD-10-CM

## 2022-11-17 DIAGNOSIS — R252 Cramp and spasm: Secondary | ICD-10-CM

## 2022-11-17 NOTE — Therapy (Signed)
OUTPATIENT PHYSICAL THERAPY TREATMENT NOTE   Patient Name: Tom White MRN: [id number] DOB:[birth year], 62 y.o., male Today's Date: 11/17/2022   REFERRING PROVIDER: Cassell Clement., MD   END OF SESSION:   PT End of Session - 11/17/22 1508     Visit Number 11    Number of Visits 16    Date for PT Re-Evaluation 11/25/22    Authorization Type [employer] MEDICARE    Progress Note Due on Visit 15    PT Start Time [id number]    PT Stop Time 1534    PT Time Calculation (min) 49 min    Activity Tolerance Patient tolerated treatment well    Behavior During Therapy WFL for tasks assessed/performed             Past Medical History:  Diagnosis Date   Arthritis    "had it in my left ankle" (05/04/2017)   Environmental and seasonal allergies 06/07/2015   Fibrosis of subtalar joint, 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   Mild cognitive impairment of uncertain or unknown etiology 09/11/2021   Mixed hyperlipidemia 06/14/2016   Pain in left ankle and joints of left foot 06/17/2017   Pain in rectum    Pneumonia 2017   PTSD (post-traumatic stress disorder) 01/30/2020   Slow transit constipation 04/02/2021   Urinary incontinence    Past Surgical History:  Procedure Laterality Date   ANKLE ARTHROSCOPY Left ~ 2013   "scraped out arthritis"   ANKLE ARTHROSCOPY WITH FUSION Left 09/22/2017   Procedure: LEFT ANKLE POSTERIOR ARTHROSCOPIC SUBTALAR ARTHRODESIS;  Surgeon: Newt Minion, MD;  Location: [facility];  Service: Orthopedics;  Laterality: Left;   ANKLE FRACTURE SURGERY Left [id number]    APPLICATION OF WOUND VAC Right [id number]   foot   APPLICATION OF WOUND VAC Right 05/03/2017   Procedure: APPLICATION OF WOUND VAC;  Surgeon: Marybelle Killings, MD;  Location: [facility];  Service: Orthopedics;  Laterality: Right;   FRACTURE SURGERY     I & D EXTREMITY Right 05/03/2017   foot   I & D EXTREMITY Right 05/03/2017   Procedure: IRRIGATION AND DEBRIDEMENT EXTREMITY RIGHT, REPAIR OF POSTERIOR TIBIAL TENDON;  Surgeon: Marybelle Killings, MD;  Location: [facility];  Service: Orthopedics;  Laterality: Right;   [place]?   POSTERIOR TIBIAL TENDON REPAIR Right 05/03/2017   SUBTALAR JOINT ARTHROEREISIS Left 1985?   TONSILLECTOMY     Patient Active Problem List   Diagnosis Date Noted   GERD (gastroesophageal reflux disease) 09/11/2021   Mild cognitive impairment of uncertain or unknown etiology 09/11/2021   Urinary incontinence    Pain in rectum    Major depressive disorder    History of concussion    Malignant melanoma of skin of back 04/16/2021   Slow transit constipation 04/02/2021   PTSD (post-traumatic stress disorder) 01/30/2020   Pain in left ankle and joints of left foot 06/17/2017   Fibrosis of subtalar joint, left 01/21/2017   Mixed hyperlipidemia 06/14/2016   Environmental and seasonal allergies 06/07/2015   HSV-2 (herpes simplex virus  2) infection 06/07/2015   Generalized anxiety disorder 06/07/2015    REFERRING DIAG: Pain in left shoulder [M25.512], Bicipital tendinitis, left shoulder [M75.22]    THERAPY DIAG:  Acute pain of left shoulder  Muscle weakness (generalized)  Cramp and spasm  Rationale for Evaluation and Treatment Rehabilitation  PERTINENT HISTORY: Arthritis, Depression   PRECAUTIONS: None   SUBJECTIVE:                                                                                                                                                                                      SUBJECTIVE STATEMENT: "Had the injection in the biceps  today.  It was really sore right after, then it felt good, now its hurting again"   PAIN:  Are you having pain? Yes: NPRS scale: 4/10 Pain location: L shoulder  Pain description: Sharp  Aggravating factors: Lifting it overhead, sometimes when he hangs it by his side.  Relieving factors: Rest, ice.    OBJECTIVE:    DIAGNOSTIC FINDINGS:  IMPRESSION:   1. Partial tears of the supraspinatus and subscapularis tendons. Background of moderate rotator cuff tendinosis with surface fraying.  2. Medial subluxation of the biceps tendon, biceps tenosynovitis.  3. Subacromial/subdeltoid bursitis.  4. Moderate acromioclavicular arthrosis.   Electronically Signed by: Oretha Milch, MD on 11/06/2022 4:11 PM Pt reports getting an X-ray with no signifcant findings.   PATIENT SURVEYS:  FOTO 54.09%, 65% in 10 visits.    COGNITION: Overall cognitive status: Within functional limits for tasks assessed                                     SENSATION: WFL   POSTURE: Rounded shoulders, and forward head.    UPPER EXTREMITY ROM:    Active ROM Right eval Left eval L 11/05/2022  Shoulder flexion WFL 180 135 P!  Shoulder Abduction  WFL 115 P! 105 P!  Shoulder Extension WFL 40 P! 58 P!  Shoulder internal rotation [facility] WFL with significant  pain  Shoulder external rotation [facility] WFL with significant  pain  (Blank rows = not tested)   UPPER EXTREMITY MMT:   MMT Right eval Left eval L 11/05/2022  Shoulder flexion 5/5 5/5 NT due to pain level 7/10  Shoulder internal rotation 5/5 5/5 NT due to pain level 7/10  Shoulder external rotation 5/5 5/5 NT due to pain level 7/10  (Blank rows = not tested)   SHOULDER SPECIAL TESTS: Impingement tests: Neer impingement test: negative and Hawkins/Kennedy impingement test: negative  02/01/2024Lift off: positive    JOINT MOBILITY TESTING:  Assess  next session      PALPATION:  Tenderness at supraspinatus insertion.               TODAY'S TREATMENT:   11/17/2022 Spent approx 20-25 min educating patient on anatomy of the shoulder and the results of the MRI and what the injection was administered for.  Explained the effect of cortisone on the tissue and encouraged him to diligently follow what the doctor said about no lifting so that the injection will be more successful and that the tissue will heal.  Explained why upper trap and levator area are tight and painful due to their compensation for the injured rotator cuff.   AAROM with cane in supine : patient needed moderate verbal cues for correct technique.  X 10 each with 3-5 second hold at end range.  AA internal rotation with towel x 10 with 3-5 sec hold at end range Ice to left shoulder in supine x 10 min   11/05/2022 UBE 2.5 min fwd/ bkwd Seated scap retraction x 20 Trigger Point Dry-Needling  Treatment instructions: Expect mild to moderate muscle soreness. S/S of pneumothorax if dry needled over a lung field, and to seek immediate medical attention should they occur. Patient verbalized understanding of these instructions and education. Patient Consent Given: Yes Education handout provided: No Muscles treated: right UT (did in supine and prone due to multiple Tp's and twitch responses), L biceps long head. Electrical stimulation performed: No Parameters: N/A Treatment response/outcome: Trigger points and taut bands identified in right upper trap by using skilled palpation.  Once identified, dry needling techniques performed.  Pt reports decreased tension,  multiple twitch responses in right upper trap noted with muscle elongation.  Patient has decreased impingement pain reports following treatment. STM to biceps prior to dry needling with significant tension noted at proximal end Moist heat for 5 min to help with pain modulation.   10/29/2022: UBE 2.5 min fwd/ bkwd 3 way scapular stabilization with green loop x 10 Left 4 D ball rolls with red plyo ball x 20 each direction Prone shoulder  ext, row with 3 lb 2 x 10, and horizontal 2 x 10 with 2 lb (patient had popping and pain with rowing again today but had patient lower his range and keep moving) Side lying ER with 2 lb 2 x 10 Supine Serratus punch x 20 with 2 lb Supine shoulder alphabet with 2 lb A - Z Trigger Point Dry-Needling  Treatment instructions: Expect mild to moderate muscle soreness. S/S of pneumothorax if dry needled over a lung field, and to seek immediate medical attention should they occur. Patient verbalized understanding of these instructions and education. Patient Consent Given: Yes Education handout provided: No Muscles treated: right UT (did in supine and prone due to multiple Tp's and twitch responses), prone position we did supraspinatus and infraspinatus  Electrical stimulation performed: No Parameters: N/A Treatment response/outcome: Trigger points and taut bands identified in right upper trap by using skilled palpation.  Once identified, dry needling techniques performed.  Pt reports decreased tension,  multiple twitch responses in right upper trap noted with muscle elongation.  Patient has decreased impingement pain reports following treatment.   PATIENT EDUCATION: Education details: Educated pt on anatomy and physiology of current symptoms, FOTO, diagnosis, prognosis, HEP,  and POC. Person educated: Patient Education method: Customer service manager Education comprehension: verbalized understanding and returned demonstration   HOME EXERCISE PROGRAM: Access Code: [email or web address] URL: [email or web address] Date: 10/22/2022 Prepared by: Candyce Churn  Exercises - Prone Shoulder  Extension - Single Arm  - 2 x daily - 7 x weekly - 2 sets - 10 reps - Prone Shoulder Row  - 2 x daily - 7 x weekly - 2 sets - 10 reps - Prone Single Arm Shoulder Horizontal Abduction with Scapular Retraction and Palm Down  - 2 x daily - 7 x weekly - 2 sets - 10 reps - Sidelying Shoulder External Rotation  - 2  x daily - 7 x weekly - 2 sets - 10 reps - Single Arm Serratus Punches in Supine with Dumbbell  - 2 x daily - 7 x weekly - 2 sets - 10 reps   ASSESSMENT:   CLINICAL IMPRESSION: [facility] had MRI and results reveal minor tearing of the supraspinatus and subscapularis along with AC joint irritation and biceps tenosynoviis and subluxing.  He just had injection this morning and is somewhat sore.  Therefore we did basic AAROM and lengthy education.  Patient was able to return understanding of the anatomy of the shoulder and precaution for the next few days with regard to loading the biceps.    OBJECTIVE IMPAIRMENTS: decreased activity tolerance, decreased shoulder mobility, decreased ROM, decreased strength, impaired flexibility, impaired UE use, postural dysfunction, and pain.   ACTIVITY LIMITATIONS: reaching, lifting, carry,  cleaning, driving, and or occupation   PERSONAL FACTORS: Arthritis, Depression also affecting patient's functional outcome.   REHAB POTENTIAL: Good   CLINICAL DECISION MAKING: Stable/uncomplicated   EVALUATION COMPLEXITY: Low       GOALS: Short term PT Goals Target date: 10/15/2022 Pt will be I and compliant with HEP. Baseline:  Goal status: MET  Pt will decrease pain by 25% overall Baseline: Goal status: NOT MET    Long term PT goals Target date: 11/19/2022 Pt will improve Rt shoulder AROM to [facility] to improve functional reaching Baseline: Goal status: NOT MET secondary to pain.  Pt will be able to perform recreational activities without reported pain.  Baseline:  Goal status: NOT MET secondary to pain.  Pt will improve FOTO to at least 65% functional to show improved function Baseline: Goal status:  Pt will reduce pain to overall less than <2/10 with usual activity and work activity. Baseline: Goal status: NOT MET 7/10 today.    PLAN: PT FREQUENCY: 1-2x per week   PT DURATION: 8 weeks   PLANNED INTERVENTIONS (unless contraindicated): aquatic PT, Canalith  repositioning, cryotherapy, Electrical stimulation, Iontophoresis with 4 mg/ml dexamethasome, Moist heat, traction, Ultrasound, gait training, Therapeutic exercise, balance training, neuromuscular re-education, patient/family education, prosthetic training, manual techniques, passive ROM, dry needling, taping, vasopnuematic device, vestibular, spinal manipulations, joint manipulations   PLAN FOR NEXT SESSION: Rotator cuff and scapular stabilizer strengthening.  PROM and AAROM.     Anderson Malta B. Tobby Fawcett, PT 11/17/22 10:02 PM  [facility] [address] [place], [place] [address] Phone # [phone] Fax [phone]

## 2022-11-18 NOTE — Therapy (Addendum)
OUTPATIENT PHYSICAL THERAPY TREATMENT NOTE   Patient Name: Tom White MRN: [id number] DOB:Mar 31, 1961, 62 y.o., male Today's Date: 11/20/2022   REFERRING PROVIDER: Cassell Clement., MD   END OF SESSION:   PT End of Session - 11/20/22 1019     Visit Number 12    Number of Visits 16    Date for PT Re-Evaluation 11/25/22    Authorization Type [employer] MEDICARE    Progress Note Due on Visit 15    PT Start Time 0928    PT Stop Time 1010    PT Time Calculation (min) 42 min    Activity Tolerance Patient tolerated treatment well    Behavior During Therapy [facility] for tasks assessed/performed              Past Medical History:  Diagnosis Date   Arthritis    "had it in my left ankle" (05/04/2017)   Environmental and seasonal allergies 06/07/2015   Fibrosis of subtalar joint, 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   Mild cognitive impairment of uncertain or unknown etiology 09/11/2021   Mixed hyperlipidemia 06/14/2016   Pain in left ankle and joints of left foot 06/17/2017   Pain in rectum    Pneumonia 2017   PTSD (post-traumatic stress disorder) 01/30/2020   Slow transit constipation 04/02/2021   Urinary incontinence    Past Surgical History:  Procedure Laterality Date   ANKLE ARTHROSCOPY Left ~ 2013   "scraped out arthritis"   ANKLE ARTHROSCOPY WITH FUSION Left 09/22/2017   Procedure: LEFT ANKLE POSTERIOR ARTHROSCOPIC SUBTALAR ARTHRODESIS;  Surgeon: Newt Minion, MD;  Location: [place];  Service: Orthopedics;  Laterality: Left;   ANKLE FRACTURE SURGERY Left [id number]    APPLICATION OF WOUND VAC Right [id number]   foot   APPLICATION OF WOUND VAC Right 05/03/2017   Procedure: APPLICATION OF WOUND VAC;  Surgeon: Marybelle Killings, MD;  Location: [place];  Service: Orthopedics;  Laterality: Right;   FRACTURE SURGERY     I & D EXTREMITY Right 05/03/2017   foot   I & D EXTREMITY Right 05/03/2017   Procedure: IRRIGATION AND DEBRIDEMENT EXTREMITY RIGHT, REPAIR OF POSTERIOR TIBIAL TENDON;  Surgeon: Marybelle Killings, MD;  Location: [place];  Service: Orthopedics;  Laterality: Right;   [place]?   POSTERIOR TIBIAL TENDON REPAIR Right 05/03/2017   SUBTALAR JOINT ARTHROEREISIS Left 1985?   TONSILLECTOMY     Patient Active Problem List   Diagnosis Date Noted   GERD (gastroesophageal reflux disease) 09/11/2021   Mild cognitive impairment of uncertain or unknown etiology 09/11/2021   Urinary incontinence    Pain in rectum    Major depressive disorder    History of concussion    Malignant melanoma of skin of back 04/16/2021   Slow transit constipation 04/02/2021   PTSD (post-traumatic stress disorder) 01/30/2020   Pain in left ankle and joints of left foot 06/17/2017   Fibrosis of subtalar joint, left 01/21/2017   Mixed hyperlipidemia 06/14/2016   Environmental and seasonal allergies 06/07/2015   HSV-2 (herpes simplex  virus 2) infection 06/07/2015   Generalized anxiety disorder 06/07/2015    REFERRING DIAG: Pain in left shoulder [M25.512], Bicipital tendinitis, left shoulder [M75.22]    THERAPY DIAG:  Acute pain of left shoulder  Muscle weakness (generalized)  Abnormal posture  Rationale for Evaluation and Treatment Rehabilitation  PERTINENT HISTORY: Arthritis, Depression   PRECAUTIONS: None   SUBJECTIVE:                                                                                                                                                                                      SUBJECTIVE STATEMENT: "Had the injection in the biceps  today.  It was really sore right after, then it felt good, now its hurting again"   PAIN:  Are you having pain? Yes: NPRS scale: 4/10 Pain location: L shoulder  Pain description: Sharp  Aggravating factors: Lifting it overhead, sometimes when he hangs it by his side.  Relieving factors: Rest, ice.    OBJECTIVE:    DIAGNOSTIC FINDINGS:  IMPRESSION:   1. Partial tears of the supraspinatus and subscapularis tendons. Background of moderate rotator cuff tendinosis with surface fraying.  2. Medial subluxation of the biceps tendon, biceps tenosynovitis.  3. Subacromial/subdeltoid bursitis.  4. Moderate acromioclavicular arthrosis.   Electronically Signed by: Oretha Milch, MD on 11/06/2022 4:11 PM Pt reports getting an X-ray with no signifcant findings.   PATIENT SURVEYS:  FOTO 54.09%, 65% in 10 visits.    COGNITION: Overall cognitive status: Within functional limits for tasks assessed                                     SENSATION: WFL   POSTURE: Rounded shoulders, and forward head.    UPPER EXTREMITY ROM:    Active ROM Right eval Left eval L 11/05/2022  Shoulder flexion WFL 180 135 P!  Shoulder Abduction  WFL 115 P! 105 P!  Shoulder Extension WFL 40 P! 58 P!  Shoulder internal rotation [facility] WFL with significant  pain  Shoulder external rotation [facility] WFL with significant  pain  (Blank rows = not tested)   UPPER EXTREMITY MMT:   MMT Right eval Left eval L 11/05/2022  Shoulder flexion 5/5 5/5 NT due to pain level 7/10  Shoulder internal rotation 5/5 5/5 NT due to pain level 7/10  Shoulder external rotation 5/5 5/5 NT due to pain level 7/10  (Blank rows = not tested)   SHOULDER SPECIAL TESTS: Impingement tests: Neer impingement test: negative and Hawkins/Kennedy impingement test: negative  02/01/2024Lift off: positive    JOINT MOBILITY TESTING:  Assess  next session      PALPATION:  Tenderness at supraspinatus insertion.               TODAY'S TREATMENT:   11/20/2022 UBE 3 min fwd/ 1.5 bkwd with severe pain.  AAROM in flexion and Abduction with cane in supine : patient needed moderate verbal cues for correct technique.  X 10 each with 3-5 second hold at end range.  Supine push up's with cane and 3# weight x20  Serratus punches x20  Sideling ER to tolerance x20  Vaso for 10 min to L shoulder at end of session.   11/20/2022 Spent approx 20-25 min educating patient on anatomy of the shoulder and the results of the MRI and what the injection was administered for.  Explained the effect of cortisone on the tissue and encouraged him to diligently follow what the doctor said about no lifting so that the injection will be more successful and that the tissue will heal.  Explained why upper trap and levator area are tight and painful due to their compensation for the injured rotator cuff.   AAROM with cane in supine : patient needed moderate verbal cues for correct technique.  X 10 each with 3-5 second hold at end range.  AA internal rotation with towel x 10 with 3-5 sec hold at end range Ice to left shoulder in supine x 10 min   11/05/2022 UBE 2.5 min fwd/ bkwd Seated scap retraction x 20 Trigger Point Dry-Needling  Treatment instructions: Expect mild to moderate muscle soreness. S/S of pneumothorax if dry needled over a lung field, and to seek immediate medical attention should they occur. Patient verbalized understanding of these instructions and education. Patient Consent Given: Yes Education handout provided: No Muscles treated: right UT (did in supine and prone due to multiple Tp's and twitch responses), L biceps long head. Electrical stimulation performed: No Parameters: N/A Treatment response/outcome: Trigger points and taut bands identified in right upper trap by using skilled palpation.  Once identified, dry needling techniques performed.  Pt reports decreased tension,  multiple twitch responses in right upper trap noted with muscle elongation.   Patient has decreased impingement pain reports following treatment. STM to biceps prior to dry needling with significant tension noted at proximal end Moist heat for 5 min to help with pain modulation.   10/29/2022: UBE 2.5 min fwd/ bkwd 3 way scapular stabilization with green loop x 10 Left 4 D ball rolls with red plyo ball x 20 each direction Prone shoulder ext, row with 3 lb 2 x 10, and horizontal 2 x 10 with 2 lb (patient had popping and pain with rowing again today but had patient lower his range and keep moving) Side lying ER with 2 lb 2 x 10 Supine Serratus punch x 20 with 2 lb Supine shoulder alphabet with 2 lb A - Z Trigger Point Dry-Needling  Treatment instructions: Expect mild to moderate muscle soreness. S/S of pneumothorax if dry needled over a lung field, and to seek immediate medical attention should they occur. Patient verbalized understanding of these instructions and education. Patient Consent Given: Yes Education handout provided: No Muscles treated: right UT (did in supine and prone due to multiple Tp's and twitch responses), prone position we did supraspinatus and infraspinatus  Electrical stimulation performed: No Parameters: N/A Treatment response/outcome: Trigger points and taut bands identified in right upper trap by using skilled palpation.  Once identified, dry needling techniques performed.  Pt reports decreased tension,  multiple twitch responses in  right upper trap noted with muscle elongation.  Patient has decreased impingement pain reports following treatment.   PATIENT EDUCATION: Education details: Educated pt on anatomy and physiology of current symptoms, FOTO, diagnosis, prognosis, HEP,  and POC. Person educated: Patient Education method: Customer service manager Education comprehension: verbalized understanding and returned demonstration   HOME EXERCISE PROGRAM: Access Code: [email or web address] URL: [email or web address] Date:  10/22/2022 Prepared by: Candyce Churn  Exercises - Prone Shoulder Extension - Single Arm  - 2 x daily - 7 x weekly - 2 sets - 10 reps - Prone Shoulder Row  - 2 x daily - 7 x weekly - 2 sets - 10 reps - Prone Single Arm Shoulder Horizontal Abduction with Scapular Retraction and Palm Down  - 2 x daily - 7 x weekly - 2 sets - 10 reps - Sidelying Shoulder External Rotation  - 2 x daily - 7 x weekly - 2 sets - 10 reps - Single Arm Serratus Punches in Supine with Dumbbell  - 2 x daily - 7 x weekly - 2 sets - 10 reps   ASSESSMENT:   CLINICAL IMPRESSION: Joedy returns to PT with continued pain in his L shoulder. Further discussion today about possible next steps. Encouraged pt to follow up with MD. Session limited secondary to pain today. Pt unable to tolerate much past AAROM and gentle strengthening today. Ended session with vaso compression to help modulate pain with pt reporting improvements at end of session.   OBJECTIVE IMPAIRMENTS: decreased activity tolerance, decreased shoulder mobility, decreased ROM, decreased strength, impaired flexibility, impaired UE use, postural dysfunction, and pain.   ACTIVITY LIMITATIONS: reaching, lifting, carry,  cleaning, driving, and or occupation   PERSONAL FACTORS: Arthritis, Depression also affecting patient's functional outcome.   REHAB POTENTIAL: Good   CLINICAL DECISION MAKING: Stable/uncomplicated   EVALUATION COMPLEXITY: Low       GOALS: Short term PT Goals Target date: 10/15/2022 Pt will be I and compliant with HEP. Baseline:  Goal status: MET  Pt will decrease pain by 25% overall Baseline: Goal status: NOT MET    Long term PT goals Target date: 11/19/2022 Pt will improve Rt shoulder AROM to [facility] to improve functional reaching Baseline: Goal status: NOT MET secondary to pain.  Pt will be able to perform recreational activities without reported pain.  Baseline:  Goal status: NOT MET secondary to pain.  Pt will improve FOTO to at least  65% functional to show improved function Baseline: Goal status:  Pt will reduce pain to overall less than <2/10 with usual activity and work activity. Baseline: Goal status: NOT MET 7/10 today.    PLAN: PT FREQUENCY: 1-2x per week   PT DURATION: 8 weeks   PLANNED INTERVENTIONS (unless contraindicated): aquatic PT, Canalith repositioning, cryotherapy, Electrical stimulation, Iontophoresis with 4 mg/ml dexamethasome, Moist heat, traction, Ultrasound, gait training, Therapeutic exercise, balance training, neuromuscular re-education, patient/family education, prosthetic training, manual techniques, passive ROM, dry needling, taping, vasopnuematic device, vestibular, spinal manipulations, joint manipulations   PLAN FOR NEXT SESSION: Rotator cuff and scapular stabilizer strengthening.  PROM and AAROM.    Rudi Heap PT, DPT 11/20/22  10:23 AM

## 2022-11-20 ENCOUNTER — Ambulatory Visit: Payer: Medicare Other | Admitting: Physical Therapy

## 2022-11-20 ENCOUNTER — Encounter: Payer: Self-pay | Admitting: Physical Therapy

## 2022-11-20 DIAGNOSIS — M25512 Pain in left shoulder: Secondary | ICD-10-CM

## 2022-11-20 DIAGNOSIS — R293 Abnormal posture: Secondary | ICD-10-CM

## 2022-11-20 DIAGNOSIS — M6281 Muscle weakness (generalized): Secondary | ICD-10-CM

## 2022-11-24 ENCOUNTER — Ambulatory Visit: Payer: Medicare Other

## 2022-11-24 DIAGNOSIS — M25512 Pain in left shoulder: Secondary | ICD-10-CM | POA: Diagnosis not present

## 2022-11-24 DIAGNOSIS — M6281 Muscle weakness (generalized): Secondary | ICD-10-CM

## 2022-11-24 DIAGNOSIS — R252 Cramp and spasm: Secondary | ICD-10-CM

## 2022-11-24 NOTE — Therapy (Signed)
OUTPATIENT PHYSICAL THERAPY TREATMENT NOTE   Patient Name: Tom White MRN: [id number] DOB:30-Jul-1961, 62 y.o., male Today's Date: 11/25/2022   REFERRING PROVIDER: Cassell Clement., MD   END OF SESSION:   PT End of Session - 11/24/22 1159     Visit Number 13    Number of Visits 16    Date for PT Re-Evaluation 11/25/22    Authorization Type [employer] MEDICARE    Progress Note Due on Visit 15    PT Start Time 1150    PT Stop Time 1230    PT Time Calculation (min) 40 min    Activity Tolerance Patient limited by pain    Behavior During Therapy [facility] for tasks assessed/performed              Past Medical History:  Diagnosis Date   Arthritis    "had it in my left ankle" (05/04/2017)   Environmental and seasonal allergies 06/07/2015   Fibrosis of subtalar joint, 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   Mild cognitive impairment of uncertain or unknown etiology 09/11/2021   Mixed hyperlipidemia 06/14/2016   Pain in left ankle and joints of left foot 06/17/2017   Pain in rectum    Pneumonia 2017   PTSD (post-traumatic stress disorder) 01/30/2020   Slow transit constipation 04/02/2021   Urinary incontinence    Past Surgical History:  Procedure Laterality Date   ANKLE ARTHROSCOPY Left ~ 2013   "scraped out arthritis"   ANKLE ARTHROSCOPY WITH FUSION Left 09/22/2017   Procedure: LEFT ANKLE POSTERIOR ARTHROSCOPIC SUBTALAR ARTHRODESIS;  Surgeon: Newt Minion, MD;  Location: [place];  Service: Orthopedics;  Laterality: Left;   ANKLE FRACTURE SURGERY Left [id number]   APPLICATION  OF WOUND VAC Right [id number]   foot   APPLICATION OF WOUND VAC Right 05/03/2017   Procedure: APPLICATION OF WOUND VAC;  Surgeon: Marybelle Killings, MD;  Location: [place];  Service: Orthopedics;  Laterality: Right;   FRACTURE SURGERY     I & D EXTREMITY Right 05/03/2017   foot   I & D EXTREMITY Right 05/03/2017   Procedure: IRRIGATION AND DEBRIDEMENT EXTREMITY RIGHT, REPAIR OF POSTERIOR TIBIAL TENDON;  Surgeon: Marybelle Killings, MD;  Location: [place];  Service: Orthopedics;  Laterality: Right;   [place]?   POSTERIOR TIBIAL TENDON REPAIR Right 05/03/2017   SUBTALAR JOINT ARTHROEREISIS Left 1985?   TONSILLECTOMY     Patient Active Problem List   Diagnosis Date Noted   GERD (gastroesophageal reflux disease) 09/11/2021   Mild cognitive impairment of uncertain or unknown etiology 09/11/2021   Urinary incontinence    Pain in rectum    Major depressive disorder    History of concussion    Malignant melanoma of skin of back 04/16/2021   Slow transit constipation 04/02/2021   PTSD (post-traumatic stress disorder) 01/30/2020   Pain in left ankle and joints of left foot 06/17/2017   Fibrosis of subtalar joint, left 01/21/2017   Mixed hyperlipidemia 06/14/2016   Environmental and seasonal allergies 06/07/2015   HSV-2 (herpes simplex  virus 2) infection 06/07/2015   Generalized anxiety disorder 06/07/2015    REFERRING DIAG: Pain in left shoulder [M25.512], Bicipital tendinitis, left shoulder [M75.22]    THERAPY DIAG:  Acute pain of left shoulder  Muscle weakness (generalized)  Cramp and spasm  Rationale for Evaluation and Treatment Rehabilitation  PERTINENT HISTORY: Arthritis, Depression   PRECAUTIONS: None   SUBJECTIVE:                                                                                                                                                                                      SUBJECTIVE STATEMENT: Patient reports "still hurting"   PAIN:   Are you having pain? Yes: NPRS scale: 4/10 Pain location: L shoulder  Pain description: Sharp  Aggravating factors: Lifting it overhead, sometimes when he hangs it by his side.  Relieving factors: Rest, ice.    OBJECTIVE:    DIAGNOSTIC FINDINGS:  IMPRESSION:   1. Partial tears of the supraspinatus and subscapularis tendons. Background of moderate rotator cuff tendinosis with surface fraying.  2. Medial subluxation of the biceps tendon, biceps tenosynovitis.  3. Subacromial/subdeltoid bursitis.  4. Moderate acromioclavicular arthrosis.   Electronically Signed by: Oretha Milch, MD on 11/06/2022 4:11 PM Pt reports getting an X-ray with no signifcant findings.   PATIENT SURVEYS:  FOTO 54.09%, 65% in 10 visits.  11/05/22: 45 11/24/22: 41   COGNITION: Overall cognitive status: Within functional limits for tasks assessed                                     SENSATION: WFL   POSTURE: Rounded shoulders, and forward head.    UPPER EXTREMITY ROM:    Active ROM Right eval Left eval L 11/05/2022 Left  11/24/22  Shoulder flexion WFL 180 135 P! 180 ("I have pain all the way through!")  Shoulder Abduction  WFL 115 P! 105 P! 137 with pain  Shoulder Extension WFL 40 P! 58 P! na  Shoulder internal rotation [facility] WFL with significant  pain WNL  Mild pain  Shoulder external rotation [facility] WFL with significant  pain WNL  Mild pain  (Blank rows = not tested)   UPPER EXTREMITY MMT:   MMT Right eval Left eval L 11/05/2022 Left  Shoulder flexion 5/5 5/5 NT due to pain level 7/10 4/5  Shoulder internal rotation 5/5 5/5 NT due to pain level 7/10 4-5  Shoulder external rotation 5/5 5/5 NT due to pain level 7/10 4-/5  Shoulder scaption with ER    4/5  Shoulder scaption with IR    4/5  (Blank  rows = not tested)   SHOULDER SPECIAL TESTS: Impingement tests: Neer impingement test: negative and Hawkins/Kennedy impingement test: negative 2/202/24: All impingement tests positive Clunk test:  negative Apprehension test: negative  02/01/2024Lift off: positive    JOINT MOBILITY TESTING:  Assess next session      PALPATION:  Tenderness at supraspinatus insertion.               TODAY'S TREATMENT:  11/24/2022 UBE 3 min fwd/ 1.5 bkwd with severe pain.  Re-eval for upcoming MD appt possible DC Reviewed HEP: added standing flexion and scaption x 20 each in pain free range  11/20/22 Spent approx 20-25 min educating patient on anatomy of the shoulder and the results of the MRI and what the injection was administered for.  Explained the effect of cortisone on the tissue and encouraged him to diligently follow what the doctor said about no lifting so that the injection will be more successful and that the tissue will heal.  Explained why upper trap and levator area are tight and painful due to their compensation for the injured rotator cuff.   AAROM with cane in supine : patient needed moderate verbal cues for correct technique.  X 10 each with 3-5 second hold at end range.  AA internal rotation with towel x 10 with 3-5 sec hold at end range Ice to left shoulder in supine x 10 min   11/05/2022 UBE 2.5 min fwd/ bkwd Seated scap retraction x 20 Trigger Point Dry-Needling  Treatment instructions: Expect mild to moderate muscle soreness. S/S of pneumothorax if dry needled over a lung field, and to seek immediate medical attention should they occur. Patient verbalized understanding of these instructions and education. Patient Consent Given: Yes Education handout provided: No Muscles treated: right UT (did in supine and prone due to multiple Tp's and twitch responses), L biceps long head. Electrical stimulation performed: No Parameters: N/A Treatment response/outcome: Trigger points and taut bands identified in right upper trap by using skilled palpation.  Once identified, dry needling techniques performed.  Pt reports decreased tension,  multiple twitch responses in right upper trap noted  with muscle elongation.  Patient has decreased impingement pain reports following treatment. STM to biceps prior to dry needling with significant tension noted at proximal end Moist heat for 5 min to help with pain modulation.   PATIENT EDUCATION: Education details: Educated pt on anatomy and physiology of current symptoms, FOTO, diagnosis, prognosis, HEP,  and POC. Person educated: Patient Education method: Customer service manager Education comprehension: verbalized understanding and returned demonstration   HOME EXERCISE PROGRAM: Access Code: [email or web address] URL: [email or web address] Date: 11/24/2022 Prepared by: Candyce Churn  Exercises - Prone Shoulder Extension - Single Arm  - 2 x daily - 7 x weekly - 2 sets - 10 reps - Prone Shoulder Row  - 2 x daily - 7 x weekly - 2 sets - 10 reps - Prone Single Arm Shoulder Horizontal Abduction with Scapular Retraction and Palm Down  - 2 x daily - 7 x weekly - 2 sets - 10 reps - Sidelying Shoulder External Rotation  - 2 x daily - 7 x weekly - 2 sets - 10 reps - Single Arm Serratus Punches in Supine with Dumbbell  - 2 x daily - 7 x weekly - 2 sets - 10 reps - Standing Shoulder Flexion to 90 Degrees with Dumbbells  - 2 x daily - 7 x weekly - 2 sets - 10 reps - Standing Shoulder Scaption  - 2 x  daily - 7 x weekly - 2 sets - 10 reps  ASSESSMENT:   CLINICAL IMPRESSION: Dontell continues to have pain and will see MD for f/u on Thursday.  His objective findings are worse than last exam and FOTO is also worse.  We will hold on PT until MD f/u.  We will DC if patient decides to do surgery.    OBJECTIVE IMPAIRMENTS: decreased activity tolerance, decreased shoulder mobility, decreased ROM, decreased strength, impaired flexibility, impaired UE use, postural dysfunction, and pain.   ACTIVITY LIMITATIONS: reaching, lifting, carry,  cleaning, driving, and or occupation   PERSONAL FACTORS: Arthritis, Depression also affecting patient's  functional outcome.   REHAB POTENTIAL: Good   CLINICAL DECISION MAKING: Stable/uncomplicated   EVALUATION COMPLEXITY: Low       GOALS: Short term PT Goals Target date: 10/15/2022 Pt will be I and compliant with HEP. Baseline:  Goal status: MET  Pt will decrease pain by 25% overall Baseline: Goal status: NOT MET    Long term PT goals Target date: 11/19/2022 Pt will improve Rt shoulder AROM to [facility] to improve functional reaching Baseline: Goal status: NOT MET secondary to pain.  Pt will be able to perform recreational activities without reported pain.  Baseline:  Goal status: NOT MET secondary to pain.  Pt will improve FOTO to at least 65% functional to show improved function Baseline: Goal status:  Pt will reduce pain to overall less than <2/10 with usual activity and work activity. Baseline: Goal status: NOT MET 7/10 today.    PLAN: PT FREQUENCY: 1-2x per week   PT DURATION: 8 weeks   PLANNED INTERVENTIONS (unless contraindicated): aquatic PT, Canalith repositioning, cryotherapy, Electrical stimulation, Iontophoresis with 4 mg/ml dexamethasome, Moist heat, traction, Ultrasound, gait training, Therapeutic exercise, balance training, neuromuscular re-education, patient/family education, prosthetic training, manual techniques, passive ROM, dry needling, taping, vasopnuematic device, vestibular, spinal manipulations, joint manipulations   PLAN FOR NEXT SESSION: Hold pending MD follow up.      Anderson Malta B. Jamar Weatherall, PT 11/25/22 12:05 AM  [facility] [address], [place], [place] [address] Phone # [phone] Fax [phone]

## 2023-01-07 ENCOUNTER — Ambulatory Visit: Payer: Medicare Other | Attending: Orthopaedic Surgery

## 2023-01-07 DIAGNOSIS — M25512 Pain in left shoulder: Secondary | ICD-10-CM | POA: Insufficient documentation

## 2023-01-07 DIAGNOSIS — R252 Cramp and spasm: Secondary | ICD-10-CM | POA: Diagnosis present

## 2023-01-07 DIAGNOSIS — M6281 Muscle weakness (generalized): Secondary | ICD-10-CM | POA: Insufficient documentation

## 2023-01-07 DIAGNOSIS — R293 Abnormal posture: Secondary | ICD-10-CM | POA: Insufficient documentation

## 2023-01-07 NOTE — Therapy (Signed)
OUTPATIENT PHYSICAL THERAPY RE-EVALUATION NOTE   Patient Name: Tom White MRN: [id number] DOB:05-23-1961, 62 y.o., male Today's Date: 01/07/2023   REFERRING PROVIDER: Cassell Clement., MD   END OF SESSION:   PT End of Session - 01/07/23 1234     Visit Number 14    Date for PT Re-Evaluation 03/04/23    Authorization Type [employer] MEDICARE    PT Start Time [id number]    PT Stop Time 1308    PT Time Calculation (min) 33 min    Activity Tolerance Patient limited by pain    Behavior During Therapy [facility] for tasks assessed/performed              Past Medical History:  Diagnosis Date   Arthritis    "had it in my left ankle" (05/04/2017)   Environmental and seasonal allergies 06/07/2015   Fibrosis of subtalar joint, 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   Mild cognitive impairment of uncertain or unknown etiology 09/11/2021   Mixed hyperlipidemia 06/14/2016   Pain in left ankle and joints of left foot 06/17/2017   Pain in rectum    Pneumonia 2017   PTSD (post-traumatic stress disorder) 01/30/2020   Slow transit constipation 04/02/2021   Urinary incontinence    Past Surgical History:  Procedure Laterality Date   ANKLE ARTHROSCOPY Left ~ 2013   "scraped out arthritis"   ANKLE ARTHROSCOPY WITH FUSION Left 09/22/2017   Procedure: LEFT ANKLE POSTERIOR ARTHROSCOPIC SUBTALAR ARTHRODESIS;  Surgeon: Newt Minion, MD;  Location: [place];  Service: Orthopedics;  Laterality: Left;   ANKLE FRACTURE SURGERY Left [id number]   APPLICATION OF WOUND VAC Right [id number]   foot   APPLICATION OF  WOUND VAC Right 05/03/2017   Procedure: APPLICATION OF WOUND VAC;  Surgeon: Marybelle Killings, MD;  Location: [place];  Service: Orthopedics;  Laterality: Right;   FRACTURE SURGERY     I & D EXTREMITY Right 05/03/2017   foot   I & D EXTREMITY Right 05/03/2017   Procedure: IRRIGATION AND DEBRIDEMENT EXTREMITY RIGHT, REPAIR OF POSTERIOR TIBIAL TENDON;  Surgeon: Marybelle Killings, MD;  Location: [place];  Service: Orthopedics;  Laterality: Right;   [place]?   POSTERIOR TIBIAL TENDON REPAIR Right 05/03/2017   SUBTALAR JOINT ARTHROEREISIS Left 1985?   TONSILLECTOMY     Patient Active Problem List   Diagnosis Date Noted   GERD (gastroesophageal reflux disease) 09/11/2021   Mild cognitive impairment of uncertain or unknown etiology 09/11/2021   Urinary incontinence    Pain in rectum    Major depressive disorder    History of concussion    Malignant melanoma of skin of back 04/16/2021   Slow transit constipation 04/02/2021   PTSD (post-traumatic stress disorder) 01/30/2020   Pain in left ankle and joints of left foot 06/17/2017   Fibrosis of subtalar joint, left 01/21/2017   Mixed hyperlipidemia 06/14/2016   Environmental and seasonal allergies 06/07/2015   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   Generalized anxiety disorder 06/07/2015    REFERRING DIAG: Pain  in left shoulder [M25.512], Bicipital tendinitis, left shoulder [M75.22]    THERAPY DIAG:  Acute pain of left shoulder  Muscle weakness (generalized)  Cramp and spasm  Abnormal posture  Rationale for Evaluation and Treatment Rehabilitation  PERTINENT HISTORY: Arthritis, Depression   PRECAUTIONS: None   SUBJECTIVE:                                                                                                                                                                                      SUBJECTIVE STATEMENT: Patient returns for re-assessment after time away from PT.   Upon last visit,  patient was placed on hold pending follow up with MD.  He explains that during follow up, MD stated that the MRI did not seem to be indicative of a need for surgery.   Patient states his shoulder is still extremely painful and he is unable to use his left arm for much at all due to the pain.  His right shoulder is beginning to hurt as well due to compensating.  He felt like returning to PT was his only hope as it did seem to be at least a little manageable when he was doing the PT.    PAIN:  Are you having pain? Yes: NPRS scale: 1/10 at rest, increases throughout range to 10/10 with overhead reach Pain location: L shoulder  Pain description: Sharp  Aggravating factors: Lifting it overhead, sometimes when he hangs it by his side.  Relieving factors: Rest, ice.    OBJECTIVE:    DIAGNOSTIC FINDINGS:  IMPRESSION:   1. Partial tears of the supraspinatus and subscapularis tendons. Background of moderate rotator cuff tendinosis with surface fraying.  2. Medial subluxation of the biceps tendon, biceps tenosynovitis.  3. Subacromial/subdeltoid bursitis.  4. Moderate acromioclavicular arthrosis.   Electronically Signed by: Oretha Milch, MD on 11/06/2022 4:11 PM Pt reports getting an X-ray with no signifcant findings.   PATIENT SURVEYS:  FOTO 54.09%, 65% in 10 visits.  11/05/22: 45 11/24/22: 41   COGNITION: Overall cognitive status: Within functional limits for tasks assessed                                     SENSATION: WFL   POSTURE: Rounded shoulders, and forward head.    UPPER EXTREMITY ROM:    Active ROM Right eval Left eval L 11/05/2022 Left  11/24/22 Left  01/07/23  Shoulder flexion WFL 180 135 P! 180 ("I have pain all the way through!") WNL with pain through nearly entire range  Shoulder Abduction  WFL 115 P! 105 P! 137 with  pain 90 with severe pain in impingement zone  Shoulder Extension WFL 40 P! 58 P! na na  Shoulder internal rotation [facility] WFL with significant  pain WNL   Mild pain WNL  Shoulder external rotation [facility] WFL with significant  pain WNL  Mild pain WNL with severe biceps pain  (Blank rows = not tested)   UPPER EXTREMITY MMT:   MMT Right eval Left eval L 11/05/2022 Left Left  Shoulder flexion 5/5 5/5 NT due to pain level 7/10 4/5 4/5 with pain  Shoulder internal rotation 5/5 5/5 NT due to pain level 7/10 4-5 4/5  Shoulder external rotation 5/5 5/5 NT due to pain level 7/10 4-/5 4-/5  Shoulder scaption with ER    4/5 4-/5  Shoulder scaption with IR    4/5 3+/5  (Blank rows = not tested)   SHOULDER SPECIAL TESTS: Impingement tests: Neer impingement test: negative and Hawkins/Kennedy impingement test: negative 2/202/24: All impingement tests positive  01/07/23: All impingement tests positive  Labral tests: Clunk test: negative Apprehension test: negative  11/05/2022 Lift off: positive  01/07/23: Speed's test: positive     JOINT MOBILITY TESTING:  01/07/23: anterior laxity      PALPATION:  Initial eval:  Tenderness at supraspinatus insertion.  01/07/23: tender to palpation at bicipital groove and supraspinatus              TODAY'S TREATMENT:  01/07/23: Re-assessment completed Discussed options for patient at this point and appropriateness of PT   11/24/2022 UBE 3 min fwd/ 1.5 bkwd with severe pain.  Re-eval for upcoming MD appt possible DC Reviewed HEP: added standing flexion and scaption x 20 each in pain free range  11/20/22 Spent approx 20-25 min educating patient on anatomy of the shoulder and the results of the MRI and what the injection was administered for.  Explained the effect of cortisone on the tissue and encouraged him to diligently follow what the doctor said about no lifting so that the injection will be more successful and that the tissue will heal.  Explained why upper trap and levator area are tight and painful due to their compensation for the injured rotator cuff.   AAROM with cane in supine : patient needed  moderate verbal cues for correct technique.  X 10 each with 3-5 second hold at end range.  AA internal rotation with towel x 10 with 3-5 sec hold at end range Ice to left shoulder in supine x 10 min   11/05/2022 UBE 2.5 min fwd/ bkwd Seated scap retraction x 20 Trigger Point Dry-Needling  Treatment instructions: Expect mild to moderate muscle soreness. S/S of pneumothorax if dry needled over a lung field, and to seek immediate medical attention should they occur. Patient verbalized understanding of these instructions and education. Patient Consent Given: Yes Education handout provided: No Muscles treated: right UT (did in supine and prone due to multiple Tp's and twitch responses), L biceps long head. Electrical stimulation performed: No Parameters: N/A Treatment response/outcome: Trigger points and taut bands identified in right upper trap by using skilled palpation.  Once identified, dry needling techniques performed.  Pt reports decreased tension,  multiple twitch responses in right upper trap noted with muscle elongation.  Patient has decreased impingement pain reports following treatment. STM to biceps prior to dry needling with significant tension noted at proximal end Moist heat for 5 min to help with pain modulation.   PATIENT EDUCATION: Education details: Educated pt on anatomy and physiology of current symptoms,  FOTO, diagnosis, prognosis, HEP,  and POC. Person educated: Patient Education method: Customer service manager Education comprehension: verbalized understanding and returned demonstration   HOME EXERCISE PROGRAM: Access Code: [email or web address] URL: [email or web address] Date: 11/24/2022 Prepared by: Candyce Churn  Exercises - Prone Shoulder Extension - Single Arm  - 2 x daily - 7 x weekly - 2 sets - 10 reps - Prone Shoulder Row  - 2 x daily - 7 x weekly - 2 sets - 10 reps - Prone Single Arm Shoulder Horizontal Abduction with Scapular Retraction and Palm  Down  - 2 x daily - 7 x weekly - 2 sets - 10 reps - Sidelying Shoulder External Rotation  - 2 x daily - 7 x weekly - 2 sets - 10 reps - Single Arm Serratus Punches in Supine with Dumbbell  - 2 x daily - 7 x weekly - 2 sets - 10 reps - Standing Shoulder Flexion to 90 Degrees with Dumbbells  - 2 x daily - 7 x weekly - 2 sets - 10 reps - Standing Shoulder Scaption  - 2 x daily - 7 x weekly - 2 sets - 10 reps  ASSESSMENT:   CLINICAL IMPRESSION: Ryley continues to have pain in the left shoulder.  His pain appears to be worsened since his last visit.  He was trying to do his exercises but the shoulder has progressively gotten worse.  His symptoms are consistent with MRI findings.  He is not likely to get favorable results with resuming PT as the symptoms are unchanged and max potential was reached last visit.  MD does not recommend any further treatment or referral to surgeon and patient is having increased pain.  We will resume PT and work to reduce symptoms.  Patient was educated on options including second opinion from Psychologist, sport and exercise.  He will pursue this while we resume PT to work toward controlling symptoms.    OBJECTIVE IMPAIRMENTS: decreased activity tolerance, decreased shoulder mobility, decreased ROM, decreased strength, impaired flexibility, impaired UE use, postural dysfunction, and pain.   ACTIVITY LIMITATIONS: reaching, lifting, carry,  cleaning, driving, and or occupation   PERSONAL FACTORS: Arthritis, Depression also affecting patient's functional outcome.   REHAB POTENTIAL: Good   CLINICAL DECISION MAKING: Stable/uncomplicated   EVALUATION COMPLEXITY: Low       GOALS: Short term PT Goals Target date: 02/04/2023  Pt will be I and compliant with HEP. Baseline:  Goal status: MET  Pt will decrease pain by 25% overall Baseline: Goal status: NOT MET     Long term PT goals Target date: 02/18/2023  Pt will improve Rt shoulder AROM to [facility] to improve functional reaching Baseline: Goal  status: NOT MET secondary to pain.  Pt will be able to perform recreational activities without reported pain.  Baseline:  Goal status: NOT MET secondary to pain.  Pt will improve FOTO to at least 65% functional to show improved function Baseline: Goal status:  Pt will reduce pain to overall less than <2/10 with usual activity and work activity. Baseline: Goal status: NOT MET 7/10 today.    PLAN: PT FREQUENCY: 1-2x per week   PT DURATION: 6 weeks   PLANNED INTERVENTIONS (unless contraindicated): aquatic PT, Canalith repositioning, cryotherapy, Electrical stimulation, Iontophoresis with 4 mg/ml dexamethasome, Moist heat, traction, Ultrasound, gait training, Therapeutic exercise, balance training, neuromuscular re-education, patient/family education, prosthetic training, manual techniques, passive ROM, dry needling, taping, vasopnuematic device, vestibular, spinal manipulations, joint manipulations   PLAN FOR NEXT SESSION: Resume PT with goal of controlling symptoms until  patient has ortho surgeon consult.      Anderson Malta B. Starletta Houchin, PT 01/07/23 8:41 PM  [facility] [address] [place],  [address] Phone # [phone] Fax [phone]

## 2023-01-14 ENCOUNTER — Ambulatory Visit: Payer: Medicare Other

## 2023-01-14 DIAGNOSIS — M6281 Muscle weakness (generalized): Secondary | ICD-10-CM

## 2023-01-14 DIAGNOSIS — M25512 Pain in left shoulder: Secondary | ICD-10-CM

## 2023-01-14 DIAGNOSIS — R293 Abnormal posture: Secondary | ICD-10-CM

## 2023-01-14 DIAGNOSIS — R252 Cramp and spasm: Secondary | ICD-10-CM

## 2023-01-14 NOTE — Therapy (Signed)
OUTPATIENT PHYSICAL THERAPY RE-EVALUATION NOTE   Patient Name: Tom White MRN: [id number] DOB:05-23-1961, 62 y.o., male Today's Date: 01/07/2023   REFERRING PROVIDER: Cassell Clement., MD   END OF SESSION:   PT End of Session - 01/07/23 1234     Visit Number 14    Date for PT Re-Evaluation 03/04/23    Authorization Type [employer] MEDICARE    PT Start Time [id number]    PT Stop Time 1308    PT Time Calculation (min) 33 min    Activity Tolerance Patient limited by pain    Behavior During Therapy [facility] for tasks assessed/performed              Past Medical History:  Diagnosis Date   Arthritis    "had it in my left ankle" (05/04/2017)   Environmental and seasonal allergies 06/07/2015   Fibrosis of subtalar joint, 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   Mild cognitive impairment of uncertain or unknown etiology 09/11/2021   Mixed hyperlipidemia 06/14/2016   Pain in left ankle and joints of left foot 06/17/2017   Pain in rectum    Pneumonia 2017   PTSD (post-traumatic stress disorder) 01/30/2020   Slow transit constipation 04/02/2021   Urinary incontinence    Past Surgical History:  Procedure Laterality Date   ANKLE ARTHROSCOPY Left ~ 2013   "scraped out arthritis"   ANKLE ARTHROSCOPY WITH FUSION Left 09/22/2017   Procedure: LEFT ANKLE POSTERIOR ARTHROSCOPIC SUBTALAR ARTHRODESIS;  Surgeon: Newt Minion, MD;  Location: [place];  Service: Orthopedics;  Laterality: Left;   ANKLE FRACTURE SURGERY Left [id number]   APPLICATION OF WOUND VAC Right [id number]   foot   APPLICATION OF  WOUND VAC Right 05/03/2017   Procedure: APPLICATION OF WOUND VAC;  Surgeon: Marybelle Killings, MD;  Location: [place];  Service: Orthopedics;  Laterality: Right;   FRACTURE SURGERY     I & D EXTREMITY Right 05/03/2017   foot   I & D EXTREMITY Right 05/03/2017   Procedure: IRRIGATION AND DEBRIDEMENT EXTREMITY RIGHT, REPAIR OF POSTERIOR TIBIAL TENDON;  Surgeon: Marybelle Killings, MD;  Location: [place];  Service: Orthopedics;  Laterality: Right;   [place]?   POSTERIOR TIBIAL TENDON REPAIR Right 05/03/2017   SUBTALAR JOINT ARTHROEREISIS Left 1985?   TONSILLECTOMY     Patient Active Problem List   Diagnosis Date Noted   GERD (gastroesophageal reflux disease) 09/11/2021   Mild cognitive impairment of uncertain or unknown etiology 09/11/2021   Urinary incontinence    Pain in rectum    Major depressive disorder    History of concussion    Malignant melanoma of skin of back 04/16/2021   Slow transit constipation 04/02/2021   PTSD (post-traumatic stress disorder) 01/30/2020   Pain in left ankle and joints of left foot 06/17/2017   Fibrosis of subtalar joint, left 01/21/2017   Mixed hyperlipidemia 06/14/2016   Environmental and seasonal allergies 06/07/2015   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   Generalized anxiety disorder 06/07/2015    REFERRING DIAG: Pain  in left shoulder [M25.512], Bicipital tendinitis, left shoulder [M75.22]    THERAPY DIAG:  Acute pain of left shoulder  Muscle weakness (generalized)  Cramp and spasm  Abnormal posture  Rationale for Evaluation and Treatment Rehabilitation  PERTINENT HISTORY: Arthritis, Depression   PRECAUTIONS: None   SUBJECTIVE:                                                                                                                                                                                      SUBJECTIVE STATEMENT: Patient arrives with continued left shoulder pain.  He states he is using his  left UE very little and that his right shoulder is beginning to hurt more.  He called for consult with Dr. Rennis ChrisSupple.  He will see PA on Tuesday and they will discuss if surgery is indicated.    PAIN:  Are you having pain? Yes: NPRS scale: 01/14/23: 1/10 at rest, increases throughout range to 10/10 with overhead reach Pain location: L shoulder  Pain description: Sharp  Aggravating factors: Lifting it overhead, sometimes when he hangs it by his side.  Relieving factors: Rest, ice.    OBJECTIVE:    DIAGNOSTIC FINDINGS:  IMPRESSION:   1. Partial tears of the supraspinatus and subscapularis tendons. Background of moderate rotator cuff tendinosis with surface fraying.  2. Medial subluxation of the biceps tendon, biceps tenosynovitis.  3. Subacromial/subdeltoid bursitis.  4. Moderate acromioclavicular arthrosis.   Electronically Signed by: Barrett HenleJeremy Cuda, MD on 11/06/2022 4:11 PM Pt reports getting an X-ray with no signifcant findings.   PATIENT SURVEYS:  FOTO 54.09%, 65% in 10 visits.  11/05/22: 45 11/24/22: 41   COGNITION: Overall cognitive status: Within functional limits for tasks assessed                                     SENSATION: WFL   POSTURE: Rounded shoulders, and forward head.    UPPER EXTREMITY ROM:    Active ROM Right eval Left eval L 11/05/2022 Left  11/24/22 Left  01/07/23  Shoulder flexion WFL 180 135 P! 180 ("I have pain all the way through!") WNL with pain through nearly entire range  Shoulder Abduction  WFL 115 P! 105 P! 137 with pain 90 with severe pain in impingement zone  Shoulder Extension WFL 40 P! 58 P! na na  [facility] with significant  pain WNL  Mild pain WNL  Shoulder external rotation [facility] WFL with significant  pain WNL  Mild pain WNL with severe biceps pain  (Blank rows = not tested)   UPPER  EXTREMITY MMT:   MMT Right eval Left eval L 11/05/2022 Left Left  Shoulder flexion 5/5 5/5 NT due to pain level 7/10 4/5 4/5  with pain  Shoulder internal rotation 5/5 5/5 NT due to pain level 7/10 4-5 4/5  Shoulder external rotation 5/5 5/5 NT due to pain level 7/10 4-/5 4-/5  Shoulder scaption with ER    4/5 4-/5  Shoulder scaption with IR    4/5 3+/5  (Blank rows = not tested)   SHOULDER SPECIAL TESTS: Impingement tests: Neer impingement test: negative and Hawkins/Kennedy impingement test: negative 2/202/24: All impingement tests positive  01/07/23: All impingement tests positive  Labral tests: Clunk test: negative Apprehension test: negative  11/05/2022 Lift off: positive  01/07/23: Speed's test: positive     JOINT MOBILITY TESTING:  01/07/23: anterior laxity      PALPATION:  Initial eval:  Tenderness at supraspinatus insertion.  01/07/23: tender to palpation at bicipital groove and supraspinatus              TODAY'S TREATMENT:  01/14/23: Attempted to start with UBE , patient had immediate pain and wincing Decided to do pain control modalities only as patient has upcoming appt with surgeon Premodulated estim to left shoulder with ice x 20 min at 10.2 with patient positioned in hook lying with bolster under knees.    01/07/23: Re-assessment completed Discussed options for patient at this point and appropriateness of PT   11/24/2022 UBE 3 min fwd/ 1.5 bkwd with severe pain.  Re-eval for upcoming MD appt possible DC Reviewed HEP: added standing flexion and scaption x 20 each in pain free range   PATIENT EDUCATION: Education details: Educated pt on anatomy and physiology of current symptoms, FOTO, diagnosis, prognosis, HEP,  and POC. Person educated: Patient Education method: Medical illustrator Education comprehension: verbalized understanding and returned demonstration   HOME EXERCISE PROGRAM: Access Code: [email or web address] URL: [email or web address] Date: 11/24/2022 Prepared by: Mikey Kirschner  Exercises - Prone Shoulder Extension - Single Arm  - 2 x daily - 7 x weekly - 2  sets - 10 reps - Prone Shoulder Row  - 2 x daily - 7 x weekly - 2 sets - 10 reps - Prone Single Arm Shoulder Horizontal Abduction with Scapular Retraction and Palm Down  - 2 x daily - 7 x weekly - 2 sets - 10 reps - Sidelying Shoulder External Rotation  - 2 x daily - 7 x weekly - 2 sets - 10 reps - Single Arm Serratus Punches in Supine with Dumbbell  - 2 x daily - 7 x weekly - 2 sets - 10 reps - Standing Shoulder Flexion to 90 Degrees with Dumbbells  - 2 x daily - 7 x weekly - 2 sets - 10 reps - Standing Shoulder Scaption  - 2 x daily - 7 x weekly - 2 sets - 10 reps  ASSESSMENT:   CLINICAL IMPRESSION: Louay continues to have pain in the left shoulder.  His pain has not subsided since last visit.  He has upcoming consult with surgeon.  We will hold on PT until meeting with surgeon to see if surgery is indicated.    OBJECTIVE IMPAIRMENTS: decreased activity tolerance, decreased shoulder mobility, decreased ROM, decreased strength, impaired flexibility, impaired UE use, postural dysfunction, and pain.   ACTIVITY LIMITATIONS: reaching, lifting, carry,  cleaning, driving, and or occupation   PERSONAL FACTORS: Arthritis, Depression also affecting patient's functional outcome.   REHAB POTENTIAL: Good   CLINICAL DECISION MAKING: Stable/uncomplicated  EVALUATION COMPLEXITY: Low       GOALS: Short term PT Goals Target date: 02/04/2023  Pt will be I and compliant with HEP. Baseline:  Goal status: MET  Pt will decrease pain by 25% overall Baseline: Goal status: NOT MET     Long term PT goals Target date: 02/18/2023  Pt will improve Rt shoulder AROM to [facility] to improve functional reaching Baseline: Goal status: NOT MET secondary to pain.  Pt will be able to perform recreational activities without reported pain.  Baseline:  Goal status: NOT MET secondary to pain.  Pt will improve FOTO to at least 65% functional to show improved function Baseline: Goal status:  Pt will reduce pain to  overall less than <2/10 with usual activity and work activity. Baseline: Goal status: NOT MET 7/10 today.    PLAN: PT FREQUENCY: 1-2x per week   PT DURATION: 6 weeks   PLANNED INTERVENTIONS (unless contraindicated): aquatic PT, Canalith repositioning, cryotherapy, Electrical stimulation, Iontophoresis with 4 mg/ml dexamethasome, Moist heat, traction, Ultrasound, gait training, Therapeutic exercise, balance training, neuromuscular re-education, patient/family education, prosthetic training, manual techniques, passive ROM, dry needling, taping, vasopnuematic device, vestibular, spinal manipulations, joint manipulations   PLAN FOR NEXT SESSION: Hold on PT until consult with surgeon.        Victorino Dike B. Kohei Antonellis, PT 01/14/23 10:24 AM [facility] [address] [place], [place] [address] Phone # [phone] Fax [phone]

## 2023-01-20 ENCOUNTER — Ambulatory Visit: Payer: Medicare Other

## 2023-01-20 DIAGNOSIS — M25512 Pain in left shoulder: Secondary | ICD-10-CM

## 2023-01-20 DIAGNOSIS — R293 Abnormal posture: Secondary | ICD-10-CM

## 2023-01-20 DIAGNOSIS — R252 Cramp and spasm: Secondary | ICD-10-CM

## 2023-01-20 DIAGNOSIS — M6281 Muscle weakness (generalized): Secondary | ICD-10-CM

## 2023-01-20 NOTE — Therapy (Signed)
OUTPATIENT PHYSICAL THERAPY RE-EVALUATION NOTE PHYSICAL THERAPY DISCHARGE SUMMARY  Visits from Start of Care: 15  Current functional level related to goals / functional outcomes: See below   Remaining deficits: See below   Education / Equipment: See below   Patient agrees to discharge. Patient goals were partially met. Patient is being discharged due to a change in medical status.    Patient Name: Tom White MRN: [id number] DOB:[birth year], 62 y.o., male Today's Date: 01/20/2023   REFERRING PROVIDER: Doloris Hall., MD   END OF SESSION:   PT End of Session - 01/20/23 1658     Visit Number 15    Date for PT Re-Evaluation 03/04/23    Authorization Type [employer] MEDICARE    PT Start Time 1100    PT Stop Time 1115    PT Time Calculation (min) 15 min              Past Medical History:  Diagnosis Date   Arthritis    "had it in my left ankle" (05/04/2017)   Environmental and seasonal allergies 06/07/2015   Fibrosis of subtalar joint, 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   Mild cognitive impairment of uncertain or unknown etiology 09/11/2021   Mixed hyperlipidemia 06/14/2016   Pain in left ankle and joints of left foot 06/17/2017   Pain in rectum    Pneumonia 2017   PTSD (post-traumatic stress disorder) 01/30/2020   Slow transit constipation 04/02/2021   Urinary incontinence    Past Surgical History:  Procedure Laterality Date   ANKLE ARTHROSCOPY Left ~ 2013   "scraped out arthritis"   ANKLE ARTHROSCOPY WITH FUSION Left 09/22/2017   Procedure: LEFT ANKLE  POSTERIOR ARTHROSCOPIC SUBTALAR ARTHRODESIS;  Surgeon: Nadara Mustard, MD;  Location: [facility] OR;  Service: Orthopedics;  Laterality: Left;   ANKLE FRACTURE SURGERY Left 12/1980   APPLICATION OF WOUND VAC Right 05/03/2017   foot   APPLICATION OF WOUND VAC Right 05/03/2017   Procedure: APPLICATION OF WOUND VAC;  Surgeon: Eldred Manges, MD;  Location: [facility] OR;  Service: Orthopedics;  Laterality: Right;   FRACTURE SURGERY     I & D EXTREMITY Right 05/03/2017   foot   I & D EXTREMITY Right 05/03/2017   Procedure: IRRIGATION AND DEBRIDEMENT EXTREMITY RIGHT, REPAIR OF POSTERIOR TIBIAL TENDON;  Surgeon: Eldred Manges, MD;  Location: [facility] OR;  Service: Orthopedics;  Laterality: Right;   LAPAROSCOPIC CHOLECYSTECTOMY  1996?   POSTERIOR TIBIAL TENDON REPAIR Right 05/03/2017   SUBTALAR JOINT ARTHROEREISIS Left 1985?   TONSILLECTOMY     Patient Active Problem List   Diagnosis Date Noted   GERD (gastroesophageal reflux disease) 09/11/2021   Mild cognitive impairment of uncertain or unknown etiology 09/11/2021   Urinary incontinence    Pain in rectum    Major depressive disorder    History of concussion    Malignant melanoma of skin of back 04/16/2021   Slow transit constipation 04/02/2021   PTSD (post-traumatic stress disorder) 01/30/2020   Pain in left ankle and joints of left foot 06/17/2017  Fibrosis of subtalar joint, left 01/21/2017   Mixed hyperlipidemia 06/14/2016   Environmental and seasonal allergies 06/07/2015   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   Generalized anxiety disorder 06/07/2015    REFERRING DIAG: Pain in left shoulder [M25.512], Bicipital tendinitis, left shoulder [M75.22]    THERAPY DIAG:  Acute pain of left shoulder  Muscle weakness (generalized)  Cramp and spasm  Abnormal posture  Rationale for Evaluation and Treatment Rehabilitation  PERTINENT HISTORY: Arthritis, Depression   PRECAUTIONS: None   SUBJECTIVE:                                                                                                                                                                                       SUBJECTIVE STATEMENT: Patient arrives for f/u after seeing surgeon.  Patient has been scheduled for surgery.    PAIN:  Are you having pain? Yes: NPRS scale: 01/14/23: 1/10 at rest, increases throughout range to 10/10 with overhead reach (no change at DC) Pain location: L shoulder  Pain description: Sharp  Aggravating factors: Lifting it overhead, sometimes when he hangs it by his side.  Relieving factors: Rest, ice.    OBJECTIVE:    DIAGNOSTIC FINDINGS:  IMPRESSION:   1. Partial tears of the supraspinatus and subscapularis tendons. Background of moderate rotator cuff tendinosis with surface fraying.  2. Medial subluxation of the biceps tendon, biceps tenosynovitis.  3. Subacromial/subdeltoid bursitis.  4. Moderate acromioclavicular arthrosis.   Electronically Signed by: Barrett Henle, MD on 11/06/2022 4:11 PM Pt reports getting an X-ray with no signifcant findings.   PATIENT SURVEYS:  FOTO 54.09%, 65% in 10 visits.  11/05/22: 45 11/24/22: 41   COGNITION: Overall cognitive status: Within functional limits for tasks assessed                                     SENSATION: WFL   POSTURE: Rounded shoulders, and forward head.    UPPER EXTREMITY ROM:    Active ROM Right eval Left eval L 11/05/2022 Left  11/24/22 Left  01/07/23  Shoulder flexion WFL 180 135 P! 180 ("I have pain all the way through!") WNL with pain through nearly entire range  Shoulder Abduction  WFL 115 P! 105 P! 137 with pain 90 with severe pain in impingement zone  Shoulder Extension WFL 40 P! 58 P! na na  Shoulder internal rotation [facility] WFL with significant  pain WNL  Mild pain WNL  Shoulder external rotation [facility] WFL with significant  pain WNL  Mild pain WNL with severe biceps pain  (Blank rows =  not tested)   UPPER EXTREMITY MMT:   MMT Right eval Left eval L  11/05/2022 Left Left  Shoulder flexion 5/5 5/5 NT due to pain level 7/10 4/5 4/5 with pain  Shoulder internal rotation 5/5 5/5 NT due to pain level 7/10 4-5 4/5  Shoulder external rotation 5/5 5/5 NT due to pain level 7/10 4-/5 4-/5  Shoulder scaption with ER    4/5 4-/5  Shoulder scaption with IR    4/5 3+/5  (Blank rows = not tested)   SHOULDER SPECIAL TESTS: Impingement tests: Neer impingement test: negative and Hawkins/Kennedy impingement test: negative 2/202/24: All impingement tests positive  01/07/23: All impingement tests positive  Labral tests: Clunk test: negative Apprehension test: negative  11/05/2022 Lift off: positive  01/07/23: Speed's test: positive     JOINT MOBILITY TESTING:  01/07/23: anterior laxity      PALPATION:  Initial eval:  Tenderness at supraspinatus insertion.  01/07/23: tender to palpation at bicipital groove and supraspinatus              TODAY'S TREATMENT:  01/14/23: Attempted to start with UBE , patient had immediate pain and wincing Decided to do pain control modalities only as patient has upcoming appt with surgeon Premodulated estim to left shoulder with ice x 20 min at 10.2 with patient positioned in hook lying with bolster under knees.    01/07/23: Re-assessment completed Discussed options for patient at this point and appropriateness of PT   11/24/2022 UBE 3 min fwd/ 1.5 bkwd with severe pain.  Re-eval for upcoming MD appt possible DC Reviewed HEP: added standing flexion and scaption x 20 each in pain free range   PATIENT EDUCATION: Education details: Educated pt on anatomy and physiology of current symptoms, FOTO, diagnosis, prognosis, HEP,  and POC. Person educated: Patient Education method: Medical illustrator Education comprehension: verbalized understanding and returned demonstration   HOME EXERCISE PROGRAM: Access Code: [email or web address] URL: [email or web address] Date: 11/24/2022 Prepared by: Mikey Kirschner  Exercises - Prone Shoulder Extension - Single Arm  - 2 x daily - 7 x weekly - 2 sets - 10 reps - Prone Shoulder Row  - 2 x daily - 7 x weekly - 2 sets - 10 reps - Prone Single Arm Shoulder Horizontal Abduction with Scapular Retraction and Palm Down  - 2 x daily - 7 x weekly - 2 sets - 10 reps - Sidelying Shoulder External Rotation  - 2 x daily - 7 x weekly - 2 sets - 10 reps - Single Arm Serratus Punches in Supine with Dumbbell  - 2 x daily - 7 x weekly - 2 sets - 10 reps - Standing Shoulder Flexion to 90 Degrees with Dumbbells  - 2 x daily - 7 x weekly - 2 sets - 10 reps - Standing Shoulder Scaption  - 2 x daily - 7 x weekly - 2 sets - 10 reps  ASSESSMENT:   CLINICAL IMPRESSION: Olivier saw surgeon and will be having surgery.  We will DC at this time.  No charge for today's visit.   OBJECTIVE IMPAIRMENTS: decreased activity tolerance, decreased shoulder mobility, decreased ROM, decreased strength, impaired flexibility, impaired UE use, postural dysfunction, and pain.   ACTIVITY LIMITATIONS: reaching, lifting, carry,  cleaning, driving, and or occupation   PERSONAL FACTORS: Arthritis, Depression also affecting patient's functional outcome.   REHAB POTENTIAL: Good   CLINICAL DECISION MAKING: Stable/uncomplicated   EVALUATION COMPLEXITY: Low       GOALS: Short term PT Goals Target date:  02/04/2023  Pt will be I and compliant with HEP. Baseline:  Goal status: MET  Pt will decrease pain by 25% overall Baseline: Goal status: NOT MET     Long term PT goals Target date: 02/18/2023  Pt will improve Rt shoulder AROM to [facility] to improve functional reaching Baseline: Goal status: NOT MET secondary to pain.  Pt will be able to perform recreational activities without reported pain.  Baseline:  Goal status: NOT MET secondary to pain.  Pt will improve FOTO to at least 65% functional to show improved function Baseline: Goal status:  Pt will reduce pain to overall less than <2/10  with usual activity and work activity. Baseline: Goal status: NOT MET 7/10 today.    PLAN: PT FREQUENCY: 1-2x per week   PT DURATION: 6 weeks   PLANNED INTERVENTIONS (unless contraindicated): aquatic PT, Canalith repositioning, cryotherapy, Electrical stimulation, Iontophoresis with 4 mg/ml dexamethasome, Moist heat, traction, Ultrasound, gait training, Therapeutic exercise, balance training, neuromuscular re-education, patient/family education, prosthetic training, manual techniques, passive ROM, dry needling, taping, vasopnuematic device, vestibular, spinal manipulations, joint manipulations   PLAN FOR NEXT SESSION: DC.  Patient will be having surgery.  Will do new eval post op.  He was not sure if he would be sent back here or if Dr. Rennis Chris would want him to go to the in office PT.         Victorino Dike B. Chyrel Taha, PT 01/20/23 5:11 PM  [facility] [address], [address] [place], [place] [address] Phone # [phone] Fax [phone]

## 2023-01-25 ENCOUNTER — Ambulatory Visit: Payer: Medicare Other

## 2023-01-27 ENCOUNTER — Ambulatory Visit: Payer: Medicare Other

## 2023-02-02 ENCOUNTER — Ambulatory Visit: Payer: Medicare Other

## 2023-02-09 ENCOUNTER — Other Ambulatory Visit: Payer: Self-pay

## 2023-02-09 ENCOUNTER — Ambulatory Visit: Payer: Medicare Other | Attending: Orthopedic Surgery

## 2023-02-09 DIAGNOSIS — M6281 Muscle weakness (generalized): Secondary | ICD-10-CM | POA: Insufficient documentation

## 2023-02-09 DIAGNOSIS — R293 Abnormal posture: Secondary | ICD-10-CM | POA: Insufficient documentation

## 2023-02-09 DIAGNOSIS — R252 Cramp and spasm: Secondary | ICD-10-CM | POA: Insufficient documentation

## 2023-02-09 DIAGNOSIS — M25512 Pain in left shoulder: Secondary | ICD-10-CM | POA: Insufficient documentation

## 2023-02-09 DIAGNOSIS — M25612 Stiffness of left shoulder, not elsewhere classified: Secondary | ICD-10-CM | POA: Insufficient documentation

## 2023-02-09 NOTE — Therapy (Signed)
OUTPATIENT PHYSICAL THERAPY UPPER EXTREMITY EVALUATION   Patient Name: Tom White MRN: [id number] DOB:08-04-1961, 62 y.o., male Today's Date: 02/09/2023  END OF SESSION:  PT End of Session - 02/09/23 1119     Visit Number 1    Date for PT Re-Evaluation 04/06/23    Authorization Type [employer] MEDICARE    Authorization - Visit Number 1    PT Start Time 1104    PT Stop Time 1145    PT Time Calculation (min) 41 min    Activity Tolerance Patient limited by pain    Behavior During Therapy [facility] for tasks assessed/performed             Past Medical History:  Diagnosis Date   Arthritis    "had it in my left ankle" (05/04/2017)   Environmental and seasonal allergies 06/07/2015   Fibrosis of subtalar joint, 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   Mild cognitive impairment of uncertain or unknown etiology 09/11/2021   Mixed hyperlipidemia 06/14/2016   Pain in left ankle and joints of left foot 06/17/2017   Pain in rectum    Pneumonia 2017   PTSD (post-traumatic stress disorder) 01/30/2020   Slow transit constipation 04/02/2021   Urinary incontinence    Past Surgical History:  Procedure Laterality Date   ANKLE ARTHROSCOPY Left ~ 2013   "scraped out arthritis"   ANKLE ARTHROSCOPY WITH FUSION Left 09/22/2017   Procedure: LEFT ANKLE POSTERIOR ARTHROSCOPIC SUBTALAR ARTHRODESIS;  Surgeon: Nadara Mustard, MD;  Location: [facility] OR;  Service: Orthopedics;  Laterality: Left;   ANKLE FRACTURE SURGERY Left 12/1980   APPLICATION OF WOUND VAC Right 05/03/2017   foot   APPLICATION OF WOUND VAC Right  05/03/2017   Procedure: APPLICATION OF WOUND VAC;  Surgeon: Eldred Manges, MD;  Location: [facility] OR;  Service: Orthopedics;  Laterality: Right;   FRACTURE SURGERY     I & D EXTREMITY Right 05/03/2017   foot   I & D EXTREMITY Right 05/03/2017   Procedure: IRRIGATION AND DEBRIDEMENT EXTREMITY RIGHT, REPAIR OF POSTERIOR TIBIAL TENDON;  Surgeon: Eldred Manges, MD;  Location: [facility] OR;  Service: Orthopedics;  Laterality: Right;   LAPAROSCOPIC CHOLECYSTECTOMY  1996?   POSTERIOR TIBIAL TENDON REPAIR Right 05/03/2017   SUBTALAR JOINT ARTHROEREISIS Left 1985?   TONSILLECTOMY     Patient Active Problem List   Diagnosis Date Noted   GERD (gastroesophageal reflux disease) 09/11/2021   Mild cognitive impairment of uncertain or unknown etiology 09/11/2021   Urinary incontinence    Pain in rectum    Major depressive disorder    History of concussion    Malignant melanoma of skin of back 04/16/2021   Slow transit constipation 04/02/2021   PTSD (post-traumatic stress disorder) 01/30/2020   Pain in left ankle and joints of left foot 06/17/2017   Fibrosis of subtalar joint, left 01/21/2017   Mixed hyperlipidemia 06/14/2016   Environmental and seasonal allergies 06/07/2015   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   Generalized anxiety disorder 06/07/2015    PCP: Eartha Inch, MD  REFERRING PROVIDER: Francena Hanly, MD  REFERRING DIAG: [id number] (ICD-10-CM) - Impingement syndrome of left shoulder  THERAPY DIAG:  Acute pain of left shoulder  Stiffness of left shoulder, not elsewhere classified  Muscle weakness (generalized)  Cramp and spasm  Rationale for Evaluation and Treatment: Rehabilitation  ONSET DATE: 01/26/2023  SUBJECTIVE:                                                                                                                                                                                      SUBJECTIVE STATEMENT: Patient reports he had surgery on 01/29/23 for  rotator cuff repair and biceps tenodesis.  He reports his pain is well managed.  He is only taking 2 percocet per day.  He is using his ice frequently.  He states that he is not sleeping well due to having to wear the sling at all times.  He hopes to have full use of his arm and be able to resume his prior level of function.   Hand dominance: Ambidextrous  PERTINENT HISTORY: na  PAIN:  Are you having pain?  2-3/10 only if moving the shoulder  PRECAUTIONS: Other: no active motion and limit elbow extension past neutral: see protocol  WEIGHT BEARING RESTRICTIONS: Yes no weight through involved UE  FALLS:  Has patient fallen in last 6 months? No  LIVING ENVIRONMENT: Lives with: lives with their spouse Lives in: House/apartment  OCCUPATION: desk  PLOF: Independent, Independent with basic ADLs, Independent with household mobility without device, Independent with community mobility without device, Independent with homemaking with ambulation, Independent with gait, and Independent with transfers  PATIENT GOALS: He hopes to have full use of his arm and be able to resume his prior level of function.    NEXT MD VISIT: 6 weeks post op  OBJECTIVE:   DIAGNOSTIC FINDINGS:  na  PATIENT SURVEYS :  FOTO 40, goal is 17  COGNITION: Overall cognitive status: Within functional limits for tasks assessed     SENSATION: WFL  POSTURE: Rounded shoulders  UPPER EXTREMITY ROM:   Passive ROM Right eval Left eval  Shoulder flexion  90  Shoulder extension    Shoulder abduction  70  Shoulder adduction    Shoulder internal rotation  30  Shoulder external rotation  30  Elbow flexion  90  Elbow extension  90  (Blank rows = not tested)  UPPER EXTREMITY MMT:  Deferred on initial eval  MMT Right eval Left eval  Shoulder flexion    Shoulder extension    Shoulder abduction    Shoulder adduction    Shoulder internal rotation    Shoulder external rotation    Middle trapezius  Lower  trapezius    Elbow flexion    Elbow extension    Wrist flexion    Wrist extension    Wrist ulnar deviation    Wrist radial deviation    Wrist pronation    Wrist supination    Grip strength (lbs)    (Blank rows = not tested)    TODAY'S TREATMENT:                                                                                                                                         DATE: 02/09/23 Initial eval completed and initiated HEP PROM left shoulder : flexion, abduction, ER, IR Ice to left shoulder in hook lying with bolster under knees, folded towels under elbow/shoulder x 10 min  PATIENT EDUCATION: Education details: Pain control, HEP Person educated: Patient Education method: Programmer, multimedia, Demonstration, and Verbal cues Education comprehension: verbalized understanding and returned demonstration  HOME EXERCISE PROGRAM: Pendulums with elbow flexed, ball squeezes, wrist flexion/extension AROM  ASSESSMENT:  CLINICAL IMPRESSION: Patient is a 62 y.o. male who was seen today for physical therapy evaluation and treatment for post left shoulder RCR and biceps tenodesis. He presents with decreased ROM, strength and function along with elevated pain.  He should respond well to RCR/biceps tenodesis protocol.     OBJECTIVE IMPAIRMENTS: decreased mobility, decreased ROM, decreased strength, hypomobility, increased fascial restrictions, increased muscle spasms, impaired UE functional use, postural dysfunction, and pain.   ACTIVITY LIMITATIONS: carrying, lifting, transfers, bed mobility, bathing, toileting, dressing, reach over head, and hygiene/grooming  PARTICIPATION LIMITATIONS: meal prep, cleaning, laundry, driving, shopping, community activity, occupation, and yard work  PERSONAL FACTORS: Fitness, Past/current experiences, and 1-2 comorbidities: anxiety, depression, PTSD  are also affecting patient's functional outcome.   REHAB POTENTIAL: Good  CLINICAL DECISION MAKING:  Stable/uncomplicated  EVALUATION COMPLEXITY: Low  GOALS: Goals reviewed with patient? Yes  SHORT TERM GOALS: Target date: 03/09/2023   Pain report to be no greater than 4/10  Baseline: Goal status: INITIAL  2.  Patient will be independent with initial HEP  Baseline:  Goal status: INITIAL  3.  ROM to improve to protocol limits Baseline:  Goal status: INITIAL  LONG TERM GOALS: Target date: 04/06/2023    Patient to report pain no greater than 2/10  Baseline:  Goal status: INITIAL  2.  Patient to be independent with advanced HEP  Baseline:  Goal status: INITIAL  3.  Patient to report 50% improvement in overall symptoms and use of left UE Baseline:  Goal status: INITIAL  4.  FOTO to be 67 Baseline: 41 Goal status: INITIAL  5.  ROM to be [facility] Baseline:  Goal status: INITIAL  6.  Strength to be 4 to 4+/5 all motions of left shoulder Baseline:  Goal status: INITIAL  PLAN: PT FREQUENCY:  3 times per week for first 3-4 weeks then 2 times per week.    PT  DURATION: 8 weeks  PLANNED INTERVENTIONS: Therapeutic exercises, Therapeutic activity, Neuromuscular re-education, Balance training, Gait training, Patient/Family education, Self Care, Joint mobilization, Aquatic Therapy, Dry Needling, Electrical stimulation, Cryotherapy, Moist heat, scar mobilization, Taping, Vasopneumatic device, Traction, Ultrasound, Ionotophoresis 4mg /ml Dexamethasone, Manual therapy, and Re-evaluation  PLAN FOR NEXT SESSION: PROM only until 03/12/23, then follow protocol   Davarius Ridener B. Eufelia Veno, PT 02/09/23 9:20 PM [facility] [address] [place], [place] [address] Phone # [phone] Fax [phone]

## 2023-02-16 ENCOUNTER — Ambulatory Visit: Payer: Medicare Other

## 2023-02-16 DIAGNOSIS — M25512 Pain in left shoulder: Secondary | ICD-10-CM | POA: Diagnosis not present

## 2023-02-16 DIAGNOSIS — M25612 Stiffness of left shoulder, not elsewhere classified: Secondary | ICD-10-CM

## 2023-02-16 DIAGNOSIS — R293 Abnormal posture: Secondary | ICD-10-CM

## 2023-02-16 DIAGNOSIS — M6281 Muscle weakness (generalized): Secondary | ICD-10-CM

## 2023-02-16 DIAGNOSIS — R252 Cramp and spasm: Secondary | ICD-10-CM

## 2023-02-16 NOTE — Therapy (Signed)
OUTPATIENT PHYSICAL THERAPY UPPER EXTREMITY TREATMENT NOTE   Patient Name: Tom White MRN: [id number] DOB:1960/11/08, 62 y.o., male Today's Date: 02/16/2023  END OF SESSION:  PT End of Session - 02/16/23 1124     Visit Number 2    Number of Visits 16    Date for PT Re-Evaluation 04/06/23    Authorization Type [employer] MEDICARE    Authorization - Visit Number 2    Progress Note Due on Visit 15    PT Start Time 1108    PT Stop Time 1135    PT Time Calculation (min) 27 min    Activity Tolerance Patient limited by pain    Behavior During Therapy [facility] for tasks assessed/performed             Past Medical History:  Diagnosis Date   Arthritis    "had it in my left ankle" (05/04/2017)   Environmental and seasonal allergies 06/07/2015   Fibrosis of subtalar joint, 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   Mild cognitive impairment of uncertain or unknown etiology 09/11/2021   Mixed hyperlipidemia 06/14/2016   Pain in left ankle and joints of left foot 06/17/2017   Pain in rectum    Pneumonia 2017   PTSD (post-traumatic stress disorder) 01/30/2020   Slow transit constipation 04/02/2021   Urinary incontinence    Past Surgical History:  Procedure Laterality Date   ANKLE ARTHROSCOPY Left ~ 2013   "scraped out arthritis"   ANKLE ARTHROSCOPY WITH FUSION Left 09/22/2017   Procedure: LEFT ANKLE POSTERIOR ARTHROSCOPIC SUBTALAR ARTHRODESIS;  Surgeon: Nadara Mustard, MD;  Location: [facility] OR;  Service: Orthopedics;  Laterality: Left;   ANKLE FRACTURE SURGERY Left 12/1980   APPLICATION OF WOUND  VAC Right 05/03/2017   foot   APPLICATION OF WOUND VAC Right 05/03/2017   Procedure: APPLICATION OF WOUND VAC;  Surgeon: Eldred Manges, MD;  Location: [facility] OR;  Service: Orthopedics;  Laterality: Right;   FRACTURE SURGERY     I & D EXTREMITY Right 05/03/2017   foot   I & D EXTREMITY Right 05/03/2017   Procedure: IRRIGATION AND DEBRIDEMENT EXTREMITY RIGHT, REPAIR OF POSTERIOR TIBIAL TENDON;  Surgeon: Eldred Manges, MD;  Location: [facility] OR;  Service: Orthopedics;  Laterality: Right;   LAPAROSCOPIC CHOLECYSTECTOMY  1996?   POSTERIOR TIBIAL TENDON REPAIR Right 05/03/2017   SUBTALAR JOINT ARTHROEREISIS Left 1985?   TONSILLECTOMY     Patient Active Problem List   Diagnosis Date Noted   GERD (gastroesophageal reflux disease) 09/11/2021   Mild cognitive impairment of uncertain or unknown etiology 09/11/2021   Urinary incontinence    Pain in rectum    Major depressive disorder    History of concussion    Malignant melanoma of skin of back 04/16/2021   Slow transit constipation 04/02/2021   PTSD (post-traumatic stress disorder) 01/30/2020   Pain in left ankle and joints of left foot 06/17/2017   Fibrosis of subtalar joint, left 01/21/2017   Mixed hyperlipidemia 06/14/2016   Environmental and seasonal allergies 06/07/2015   HSV-2 (herpes simplex virus  2) infection 06/07/2015   Generalized anxiety disorder 06/07/2015    PCP: Eartha Inch, MD   REFERRING PROVIDER: Francena Hanly, MD  REFERRING DIAG: [phone] (ICD-10-CM) - Impingement syndrome of left shoulder  THERAPY DIAG:  Acute pain of left shoulder  Stiffness of left shoulder, not elsewhere classified  Muscle weakness (generalized)  Cramp and spasm  Abnormal posture  Rationale for Evaluation and Treatment: Rehabilitation  ONSET DATE: 01/26/2023  SUBJECTIVE:                                                                                                                                                                                       SUBJECTIVE STATEMENT: Patient reports he is doing well.  No significant pain in the shoulder but "I just feel wobbly".  He comes in using his cane.    Hand dominance: Ambidextrous  PERTINENT HISTORY: na  PAIN:  Are you having pain?  2-3/10 only if moving the shoulder  PRECAUTIONS: Other: no active motion and limit elbow extension past neutral: see protocol  WEIGHT BEARING RESTRICTIONS: Yes no weight through involved UE  FALLS:  Has patient fallen in last 6 months? No  LIVING ENVIRONMENT: Lives with: lives with their spouse Lives in: House/apartment  OCCUPATION: desk  PLOF: Independent, Independent with basic ADLs, Independent with household mobility without device, Independent with community mobility without device, Independent with homemaking with ambulation, Independent with gait, and Independent with transfers  PATIENT GOALS: He hopes to have full use of his arm and be able to resume his prior level of function.    NEXT MD VISIT: 6 weeks post op  OBJECTIVE:   DIAGNOSTIC FINDINGS:  na  PATIENT SURVEYS :  FOTO 40, goal is 43  COGNITION: Overall cognitive status: Within functional limits for tasks assessed     SENSATION: WFL  POSTURE: Rounded shoulders  UPPER EXTREMITY ROM:   Passive ROM Right eval Left eval  Shoulder flexion  90  Shoulder extension    Shoulder abduction  70  Shoulder adduction    Shoulder internal rotation  30  Shoulder external rotation  30  Elbow flexion  90  Elbow extension  90  (Blank rows = not tested)  UPPER EXTREMITY MMT:  Deferred on initial eval  MMT Right eval Left eval  Shoulder flexion    Shoulder extension    Shoulder abduction    Shoulder adduction    Shoulder internal rotation    Shoulder external rotation    Middle trapezius    Lower trapezius    Elbow flexion    Elbow extension    Wrist flexion    Wrist extension    Wrist  ulnar deviation    Wrist radial deviation    Wrist pronation     Wrist supination    Grip strength (lbs)    (Blank rows = not tested)    TODAY'S TREATMENT:                                                                                                                                         DATE: 02/16/23 PROM left shoulder : flexion, abduction, ER, IR Reviewed HEP and precautions Ice to left shoulder in hook lying with bolster under knees, folded towels under elbow/shoulder x 10 min  DATE: 02/09/23 Initial eval completed and initiated HEP PROM left shoulder : flexion, abduction, ER, IR Ice to left shoulder in hook lying with bolster under knees, folded towels under elbow/shoulder x 10 min  PATIENT EDUCATION: Education details: Pain control, HEP Person educated: Patient Education method: Programmer, multimedia, Demonstration, and Verbal cues Education comprehension: verbalized understanding and returned demonstration  HOME EXERCISE PROGRAM: Pendulums with elbow flexed, ball squeezes, wrist flexion/extension AROM  ASSESSMENT:  CLINICAL IMPRESSION: Mahmud is doing well.  ROM progressing well and to protocol specifications.  He has little pain to speak of and is compliant with his HEP.  He may be overly sensitive to the muscle relaxer.  Suggested he take it at night only.  He would benefit from continued skilled PT for post RCR and biceps tenodesis protocol.    OBJECTIVE IMPAIRMENTS: decreased mobility, decreased ROM, decreased strength, hypomobility, increased fascial restrictions, increased muscle spasms, impaired UE functional use, postural dysfunction, and pain.   ACTIVITY LIMITATIONS: carrying, lifting, transfers, bed mobility, bathing, toileting, dressing, reach over head, and hygiene/grooming  PARTICIPATION LIMITATIONS: meal prep, cleaning, laundry, driving, shopping, community activity, occupation, and yard work  PERSONAL FACTORS: Fitness, Past/current experiences, and 1-2 comorbidities: anxiety, depression, PTSD  are also affecting patient's  functional outcome.   REHAB POTENTIAL: Good  CLINICAL DECISION MAKING: Stable/uncomplicated  EVALUATION COMPLEXITY: Low  GOALS: Goals reviewed with patient? Yes  SHORT TERM GOALS: Target date: 03/09/2023   Pain report to be no greater than 4/10  Baseline: Goal status: INITIAL  2.  Patient will be independent with initial HEP  Baseline:  Goal status: INITIAL  3.  ROM to improve to protocol limits Baseline:  Goal status: INITIAL  LONG TERM GOALS: Target date: 04/06/2023    Patient to report pain no greater than 2/10  Baseline:  Goal status: INITIAL  2.  Patient to be independent with advanced HEP  Baseline:  Goal status: INITIAL  3.  Patient to report 50% improvement in overall symptoms and use of left UE Baseline:  Goal status: INITIAL  4.  FOTO to be 67 Baseline: 41 Goal status: INITIAL  5.  ROM to be [facility] Baseline:  Goal status: INITIAL  6.  Strength to be 4 to 4+/5 all motions of left shoulder Baseline:  Goal status: INITIAL  PLAN:  PT FREQUENCY:  3 times per week for first 3-4 weeks then 2 times per week.    PT DURATION: 8 weeks  PLANNED INTERVENTIONS: Therapeutic exercises, Therapeutic activity, Neuromuscular re-education, Balance training, Gait training, Patient/Family education, Self Care, Joint mobilization, Aquatic Therapy, Dry Needling, Electrical stimulation, Cryotherapy, Moist heat, scar mobilization, Taping, Vasopneumatic device, Traction, Ultrasound, Ionotophoresis 4mg /ml Dexamethasone, Manual therapy, and Re-evaluation  PLAN FOR NEXT SESSION: PROM only until 03/12/23, then follow protocol   Miachel Nardelli B. Kassaundra Hair, PT 02/16/23 11:47 AM  [facility] [address] [place], [place] [address] Phone # [phone] Fax [phone]

## 2023-02-17 NOTE — Therapy (Signed)
OUTPATIENT PHYSICAL THERAPY UPPER EXTREMITY TREATMENT NOTE   Patient Name: Tom White MRN: [id number] DOB:[birth year], 62 y.o., male Today's Date: 02/18/2023  END OF SESSION:  PT End of Session - 02/18/23 1013     Visit Number 3    Number of Visits 16    Date for PT Re-Evaluation 04/06/23    Authorization Type [employer] MEDICARE    Authorization - Visit Number 3    Progress Note Due on Visit 15    PT Start Time 1015    PT Stop Time 1057   10 min ice at end   PT Time Calculation (min) 42 min    Activity Tolerance Patient limited by pain    Behavior During Therapy [facility] for tasks assessed/performed             Past Medical History:  Diagnosis Date   Arthritis    "had it in my left ankle" (05/04/2017)   Environmental and seasonal allergies 06/07/2015   Fibrosis of subtalar joint, 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   Mild cognitive impairment of uncertain or unknown etiology 09/11/2021   Mixed hyperlipidemia 06/14/2016   Pain in left ankle and joints of left foot 06/17/2017   Pain in rectum    Pneumonia 2017   PTSD (post-traumatic stress disorder) 01/30/2020   Slow transit constipation 04/02/2021   Urinary incontinence    Past Surgical History:  Procedure Laterality Date   ANKLE ARTHROSCOPY Left ~ 2013   "scraped out arthritis"   ANKLE ARTHROSCOPY WITH FUSION Left 09/22/2017   Procedure: LEFT ANKLE POSTERIOR ARTHROSCOPIC SUBTALAR ARTHRODESIS;  Surgeon: Nadara Mustard, MD;  Location: [facility] OR;  Service: Orthopedics;  Laterality: Left;   ANKLE FRACTURE SURGERY Left 12/1980    APPLICATION OF WOUND VAC Right 05/03/2017   foot   APPLICATION OF WOUND VAC Right 05/03/2017   Procedure: APPLICATION OF WOUND VAC;  Surgeon: Eldred Manges, MD;  Location: [facility] OR;  Service: Orthopedics;  Laterality: Right;   FRACTURE SURGERY     I & D EXTREMITY Right 05/03/2017   foot   I & D EXTREMITY Right 05/03/2017   Procedure: IRRIGATION AND DEBRIDEMENT EXTREMITY RIGHT, REPAIR OF POSTERIOR TIBIAL TENDON;  Surgeon: Eldred Manges, MD;  Location: [facility] OR;  Service: Orthopedics;  Laterality: Right;   LAPAROSCOPIC CHOLECYSTECTOMY  1996?   POSTERIOR TIBIAL TENDON REPAIR Right 05/03/2017   SUBTALAR JOINT ARTHROEREISIS Left 1985?   TONSILLECTOMY     Patient Active Problem List   Diagnosis Date Noted   GERD (gastroesophageal reflux disease) 09/11/2021   Mild cognitive impairment of uncertain or unknown etiology 09/11/2021   Urinary incontinence    Pain in rectum    Major depressive disorder    History of concussion    Malignant melanoma of skin of back 04/16/2021   Slow transit constipation 04/02/2021   PTSD (post-traumatic stress disorder) 01/30/2020   Pain in left ankle and joints of left foot 06/17/2017   Fibrosis of subtalar joint, left 01/21/2017   Mixed hyperlipidemia 06/14/2016   Environmental and seasonal allergies 06/07/2015  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   Generalized anxiety disorder 06/07/2015    PCP: Eartha Inch, MD   REFERRING PROVIDER: Francena Hanly, MD  REFERRING DIAG: [phone] (ICD-10-CM) - Impingement syndrome of left shoulder  THERAPY DIAG:  Acute pain of left shoulder  Stiffness of left shoulder, not elsewhere classified  Muscle weakness (generalized)  Cramp and spasm  Abnormal posture  Rationale for Evaluation and Treatment: Rehabilitation  ONSET DATE: 01/26/2023  SUBJECTIVE:                                                                                                                                                                                       SUBJECTIVE STATEMENT: Patient reports he bumped his shoulder into the wall last night after he lost his balance. Balance has been affected by meds. He hasn't iced it.  Hand dominance: Ambidextrous  PERTINENT HISTORY: na  PAIN:  Are you having pain?  5-6/10 only if moving the shoulder  PRECAUTIONS: Other: no active motion and limit elbow extension past neutral: see protocol  WEIGHT BEARING RESTRICTIONS: Yes no weight through involved UE  FALLS:  Has patient fallen in last 6 months? No  LIVING ENVIRONMENT: Lives with: lives with their spouse Lives in: House/apartment  OCCUPATION: desk  PLOF: Independent, Independent with basic ADLs, Independent with household mobility without device, Independent with community mobility without device, Independent with homemaking with ambulation, Independent with gait, and Independent with transfers  PATIENT GOALS: He hopes to have full use of his arm and be able to resume his prior level of function.    NEXT MD VISIT: 6 weeks post op  OBJECTIVE:   DIAGNOSTIC FINDINGS:  na  PATIENT SURVEYS :  FOTO 40, goal is 12  COGNITION: Overall cognitive status: Within functional limits for tasks assessed     SENSATION: WFL  POSTURE: Rounded shoulders  UPPER EXTREMITY ROM:   Passive ROM Right eval Left eval  Shoulder flexion  90  Shoulder extension    Shoulder abduction  70  Shoulder adduction    Shoulder internal rotation  30  Shoulder external rotation  30  Elbow flexion  90  Elbow extension  90  (Blank rows = not tested)  UPPER EXTREMITY MMT:  Deferred on initial eval  MMT Right eval Left eval  Shoulder flexion    Shoulder extension    Shoulder abduction    Shoulder adduction    Shoulder internal rotation    Shoulder external rotation    Middle trapezius    Lower trapezius    Elbow flexion    Elbow extension    Wrist flexion    Wrist extension  Wrist ulnar deviation    Wrist  radial deviation    Wrist pronation    Wrist supination    Grip strength (lbs)    (Blank rows = not tested)    TODAY'S TREATMENT:                                                                                                                                         DATE:   02/18/23 PROM left shoulder : flexion, abduction, ER, IR; PROM pro/sup with elbow bent Ice to left shoulder in hook lying with bolster under knees, folded towels under elbow/shoulder x 10 min Self Care: Discussed side effects of Cyclobenzaprine with his anti-depressants. Pt to discuss with MD and pharmacist.  02/16/23 PROM left shoulder : flexion, abduction, ER, IR Reviewed HEP and precautions Ice to left shoulder in hook lying with bolster under knees, folded towels under elbow/shoulder x 10 min  DATE: 02/09/23 Initial eval completed and initiated HEP PROM left shoulder : flexion, abduction, ER, IR Ice to left shoulder in hook lying with bolster under knees, folded towels under elbow/shoulder x 10 min  PATIENT EDUCATION: Education details: Pain control, HEP Person educated: Patient Education method: Programmer, multimedia, Demonstration, and Verbal cues Education comprehension: verbalized understanding and returned demonstration  HOME EXERCISE PROGRAM: Pendulums with elbow flexed, ball squeezes, wrist flexion/extension AROM  ASSESSMENT:  CLINICAL IMPRESSION: Jaquinn reports that he has been dizzy and hallucinating since taking Flexeril. Last night he awoke on the couch without his sling and did not remember going there. His sling was in the bathroom. He also became dizzy earlier in the day and stumbled into the wall hitting his shoulder. He did alert Dr. Rennis Chris to this who advised decreasing Flexeril to only at night. Due to hallucinations, PT advised stopping the Flexeril due to likely interaction with his anti-depressant until he speaks with Dr. Rennis Chris again and/or his pharmacist. He tolerated PROM without complaint. He did  have increased pain overall today versus last visit. Advised he return to icing as well.    OBJECTIVE IMPAIRMENTS: decreased mobility, decreased ROM, decreased strength, hypomobility, increased fascial restrictions, increased muscle spasms, impaired UE functional use, postural dysfunction, and pain.   ACTIVITY LIMITATIONS: carrying, lifting, transfers, bed mobility, bathing, toileting, dressing, reach over head, and hygiene/grooming  PARTICIPATION LIMITATIONS: meal prep, cleaning, laundry, driving, shopping, community activity, occupation, and yard work  PERSONAL FACTORS: Fitness, Past/current experiences, and 1-2 comorbidities: anxiety, depression, PTSD  are also affecting patient's functional outcome.   REHAB POTENTIAL: Good  CLINICAL DECISION MAKING: Stable/uncomplicated  EVALUATION COMPLEXITY: Low  GOALS: Goals reviewed with patient? Yes  SHORT TERM GOALS: Target date: 03/09/2023   Pain report to be no greater than 4/10  Baseline: Goal status: INITIAL  2.  Patient will be independent with initial HEP  Baseline:  Goal status: INITIAL  3.  ROM to improve to protocol limits Baseline:  Goal status: INITIAL  LONG TERM GOALS: Target date: 04/06/2023    Patient to report pain no greater than 2/10  Baseline:  Goal status: INITIAL  2.  Patient to be independent with advanced HEP  Baseline:  Goal status: INITIAL  3.  Patient to report 50% improvement in overall symptoms and use of left UE Baseline:  Goal status: INITIAL  4.  FOTO to be 67 Baseline: 41 Goal status: INITIAL  5.  ROM to be [facility] Baseline:  Goal status: INITIAL  6.  Strength to be 4 to 4+/5 all motions of left shoulder Baseline:  Goal status: INITIAL  PLAN: PT FREQUENCY:  3 times per week for first 3-4 weeks then 2 times per week.    PT DURATION: 8 weeks  PLANNED INTERVENTIONS: Therapeutic exercises, Therapeutic activity, Neuromuscular re-education, Balance training, Gait training, Patient/Family  education, Self Care, Joint mobilization, Aquatic Therapy, Dry Needling, Electrical stimulation, Cryotherapy, Moist heat, scar mobilization, Taping, Vasopneumatic device, Traction, Ultrasound, Ionotophoresis 4mg /ml Dexamethasone, Manual therapy, and Re-evaluation  PLAN FOR NEXT SESSION: PROM only until 03/12/23, then follow protocol   Solon Palm, PT 02/18/23 10:57 AM [facility] [address], [address] [place], [place] [address] Phone # [phone] Fax [phone]

## 2023-02-18 ENCOUNTER — Ambulatory Visit: Payer: Medicare Other | Admitting: Physical Therapy

## 2023-02-18 ENCOUNTER — Encounter: Payer: Self-pay | Admitting: Physical Therapy

## 2023-02-18 DIAGNOSIS — R293 Abnormal posture: Secondary | ICD-10-CM

## 2023-02-18 DIAGNOSIS — M25612 Stiffness of left shoulder, not elsewhere classified: Secondary | ICD-10-CM

## 2023-02-18 DIAGNOSIS — M25512 Pain in left shoulder: Secondary | ICD-10-CM | POA: Diagnosis not present

## 2023-02-18 DIAGNOSIS — M6281 Muscle weakness (generalized): Secondary | ICD-10-CM

## 2023-02-18 DIAGNOSIS — R252 Cramp and spasm: Secondary | ICD-10-CM

## 2023-02-23 ENCOUNTER — Ambulatory Visit: Payer: Medicare Other

## 2023-02-23 DIAGNOSIS — M25512 Pain in left shoulder: Secondary | ICD-10-CM

## 2023-02-23 DIAGNOSIS — R293 Abnormal posture: Secondary | ICD-10-CM

## 2023-02-23 DIAGNOSIS — M6281 Muscle weakness (generalized): Secondary | ICD-10-CM

## 2023-02-23 DIAGNOSIS — M25612 Stiffness of left shoulder, not elsewhere classified: Secondary | ICD-10-CM

## 2023-02-23 DIAGNOSIS — R252 Cramp and spasm: Secondary | ICD-10-CM

## 2023-02-23 NOTE — Therapy (Signed)
OUTPATIENT PHYSICAL THERAPY UPPER EXTREMITY TREATMENT NOTE   Patient Name: Govinda Rabe MRN: [id number] DOB:[birth year], 62 y.o., male Today's Date: 02/23/2023  END OF SESSION:  PT End of Session - 02/23/23 1106     Visit Number 4    Number of Visits 16    Authorization Type [employer] MEDICARE    Progress Note Due on Visit 15    PT Start Time 1100    PT Stop Time 1135    PT Time Calculation (min) 35 min    Activity Tolerance Patient limited by pain    Behavior During Therapy [facility] for tasks assessed/performed             Past Medical History:  Diagnosis Date   Arthritis    "had it in my left ankle" (05/04/2017)   Environmental and seasonal allergies 06/07/2015   Fibrosis of subtalar joint, 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   Mild cognitive impairment of uncertain or unknown etiology 09/11/2021   Mixed hyperlipidemia 06/14/2016   Pain in left ankle and joints of left foot 06/17/2017   Pain in rectum    Pneumonia 2017   PTSD (post-traumatic stress disorder) 01/30/2020   Slow transit constipation 04/02/2021   Urinary incontinence    Past Surgical History:  Procedure Laterality Date   ANKLE ARTHROSCOPY Left ~ 2013   "scraped out arthritis"   ANKLE ARTHROSCOPY WITH FUSION Left 09/22/2017   Procedure: LEFT ANKLE POSTERIOR ARTHROSCOPIC SUBTALAR ARTHRODESIS;  Surgeon: Nadara Mustard, MD;  Location: [facility] OR;  Service: Orthopedics;  Laterality: Left;   ANKLE FRACTURE SURGERY Left 12/1980   APPLICATION OF WOUND VAC Right 05/03/2017   foot   APPLICATION OF WOUND VAC Right 05/03/2017    Procedure: APPLICATION OF WOUND VAC;  Surgeon: Eldred Manges, MD;  Location: [facility] OR;  Service: Orthopedics;  Laterality: Right;   FRACTURE SURGERY     I & D EXTREMITY Right 05/03/2017   foot   I & D EXTREMITY Right 05/03/2017   Procedure: IRRIGATION AND DEBRIDEMENT EXTREMITY RIGHT, REPAIR OF POSTERIOR TIBIAL TENDON;  Surgeon: Eldred Manges, MD;  Location: [facility] OR;  Service: Orthopedics;  Laterality: Right;   LAPAROSCOPIC CHOLECYSTECTOMY  1996?   POSTERIOR TIBIAL TENDON REPAIR Right 05/03/2017   SUBTALAR JOINT ARTHROEREISIS Left 1985?   TONSILLECTOMY     Patient Active Problem List   Diagnosis Date Noted   GERD (gastroesophageal reflux disease) 09/11/2021   Mild cognitive impairment of uncertain or unknown etiology 09/11/2021   Urinary incontinence    Pain in rectum    Major depressive disorder    History of concussion    Malignant melanoma of skin of back 04/16/2021   Slow transit constipation 04/02/2021   PTSD (post-traumatic stress disorder) 01/30/2020   Pain in left ankle and joints of left foot 06/17/2017   Fibrosis of subtalar joint, left 01/21/2017   Mixed hyperlipidemia 06/14/2016   Environmental and seasonal allergies 06/07/2015   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   Generalized anxiety disorder 06/07/2015    PCP: Eartha Inch,  MD   REFERRING PROVIDER: Francena Hanly, MD  REFERRING DIAG: [phone] (ICD-10-CM) - Impingement syndrome of left shoulder  THERAPY DIAG:  Acute pain of left shoulder  Stiffness of left shoulder, not elsewhere classified  Muscle weakness (generalized)  Cramp and spasm  Abnormal posture  Rationale for Evaluation and Treatment: Rehabilitation  ONSET DATE: 01/26/2023  SUBJECTIVE:                                                                                                                                                                                      SUBJECTIVE STATEMENT: Patient reports he is doing better now  that he isn't taking the muscle relaxer.  He isn't as dizzy.  Not stumbling and losing his balance.  Pain is 0/10 unless I accidentally move it in an awkward manner.  I am still not sleeping that good.    Hand dominance: Ambidextrous  PERTINENT HISTORY: na  PAIN:  Are you having pain?  0-8/10 only if moving the shoulder  PRECAUTIONS: Other: no active motion and limit elbow extension past neutral: see protocol  WEIGHT BEARING RESTRICTIONS: Yes no weight through involved UE  FALLS:  Has patient fallen in last 6 months? No  LIVING ENVIRONMENT: Lives with: lives with their spouse Lives in: House/apartment  OCCUPATION: desk  PLOF: Independent, Independent with basic ADLs, Independent with household mobility without device, Independent with community mobility without device, Independent with homemaking with ambulation, Independent with gait, and Independent with transfers  PATIENT GOALS: He hopes to have full use of his arm and be able to resume his prior level of function.    NEXT MD VISIT: 6 weeks post op  OBJECTIVE:   DIAGNOSTIC FINDINGS:  na  PATIENT SURVEYS :  FOTO 40, goal is 17  COGNITION: Overall cognitive status: Within functional limits for tasks assessed     SENSATION: WFL  POSTURE: Rounded shoulders  UPPER EXTREMITY ROM:   Passive ROM Right eval Left eval  Shoulder flexion  90  Shoulder extension    Shoulder abduction  70  Shoulder adduction    Shoulder internal rotation  30  Shoulder external rotation  30  Elbow flexion  90  Elbow extension  90  (Blank rows = not tested)  UPPER EXTREMITY MMT:  Deferred on initial eval  MMT Right eval Left eval  Shoulder flexion    Shoulder extension    Shoulder abduction    Shoulder adduction    Shoulder internal rotation    Shoulder external rotation    Middle trapezius    Lower trapezius    Elbow flexion    Elbow extension    Wrist flexion  Wrist extension    Wrist ulnar deviation    Wrist  radial deviation    Wrist pronation    Wrist supination    Grip strength (lbs)    (Blank rows = not tested)    TODAY'S TREATMENT:                                                                                                                                         DATE:  02/18/23 PROM left shoulder : flexion, abduction, ER, IR; PROM pro/sup with elbow bent Ice to left shoulder in hook lying with bolster under knees, folded towels under elbow/shoulder x 10 min  02/18/23 PROM left shoulder : flexion, abduction, ER, IR; PROM pro/sup with elbow bent Ice to left shoulder in hook lying with bolster under knees, folded towels under elbow/shoulder x 10 min Self Care: Discussed side effects of Cyclobenzaprine with his anti-depressants. Pt to discuss with MD and pharmacist.  02/16/23 PROM left shoulder : flexion, abduction, ER, IR Reviewed HEP and precautions Ice to left shoulder in hook lying with bolster under knees, folded towels under elbow/shoulder x 10 min  DATE: 02/09/23 Initial eval completed and initiated HEP PROM left shoulder : flexion, abduction, ER, IR Ice to left shoulder in hook lying with bolster under knees, folded towels under elbow/shoulder x 10 min  PATIENT EDUCATION: Education details: Pain control, HEP Person educated: Patient Education method: Programmer, multimedia, Demonstration, and Verbal cues Education comprehension: verbalized understanding and returned demonstration  HOME EXERCISE PROGRAM: Pendulums with elbow flexed, ball squeezes, wrist flexion/extension AROM  ASSESSMENT:  CLINICAL IMPRESSION: Vihaanreddy is doing better since adjusting his meds.  His pain is 0/10 unless he accidentally moves his arm.  His ROM is progressing appropriately.   He has approx 100 available in flexion and abduction, ER to approx 45 degrees and IR to 45 degrees.  He has very little pain during PROM.  He is progressing appropriately and would benefit from continued skilled PT for post RCR and biceps  tenodesis protocol.     OBJECTIVE IMPAIRMENTS: decreased mobility, decreased ROM, decreased strength, hypomobility, increased fascial restrictions, increased muscle spasms, impaired UE functional use, postural dysfunction, and pain.   ACTIVITY LIMITATIONS: carrying, lifting, transfers, bed mobility, bathing, toileting, dressing, reach over head, and hygiene/grooming  PARTICIPATION LIMITATIONS: meal prep, cleaning, laundry, driving, shopping, community activity, occupation, and yard work  PERSONAL FACTORS: Fitness, Past/current experiences, and 1-2 comorbidities: anxiety, depression, PTSD  are also affecting patient's functional outcome.   REHAB POTENTIAL: Good  CLINICAL DECISION MAKING: Stable/uncomplicated  EVALUATION COMPLEXITY: Low  GOALS: Goals reviewed with patient? Yes  SHORT TERM GOALS: Target date: 03/09/2023   Pain report to be no greater than 4/10  Baseline: Goal status: IN PROGRESS  2.  Patient will be independent with initial HEP  Baseline:  Goal status: INITIAL  3.  ROM to improve to protocol limits Baseline:  Goal status: MET  02/23/23  LONG TERM GOALS: Target date: 04/06/2023    Patient to report pain no greater than 2/10  Baseline:  Goal status: INITIAL  2.  Patient to be independent with advanced HEP  Baseline:  Goal status: INITIAL  3.  Patient to report 50% improvement in overall symptoms and use of left UE Baseline:  Goal status: INITIAL  4.  FOTO to be 67 Baseline: 41 Goal status: INITIAL  5.  ROM to be [facility] Baseline:  Goal status: INITIAL  6.  Strength to be 4 to 4+/5 all motions of left shoulder Baseline:  Goal status: INITIAL  PLAN: PT FREQUENCY:  3 times per week for first 3-4 weeks then 2 times per week.    PT DURATION: 8 weeks  PLANNED INTERVENTIONS: Therapeutic exercises, Therapeutic activity, Neuromuscular re-education, Balance training, Gait training, Patient/Family education, Self Care, Joint mobilization, Aquatic Therapy, Dry  Needling, Electrical stimulation, Cryotherapy, Moist heat, scar mobilization, Taping, Vasopneumatic device, Traction, Ultrasound, Ionotophoresis 4mg /ml Dexamethasone, Manual therapy, and Re-evaluation  PLAN FOR NEXT SESSION: Continue PROM only until 03/12/23, then follow protocol   Magie Ciampa B. Yvan Dority, PT 02/23/23 11:43 AM [facility] [address], [address] [place], [place] [address] Phone # [phone] Fax [phone]

## 2023-02-24 NOTE — Therapy (Signed)
OUTPATIENT PHYSICAL THERAPY UPPER EXTREMITY TREATMENT NOTE   Patient Name: Tom White MRN: [id number] DOB:August 16, 1961, 62 y.o., male Today's Date: 02/25/2023  END OF SESSION:  PT End of Session - 02/25/23 1020     Visit Number 5    Number of Visits 16    Date for PT Re-Evaluation 04/06/23    Authorization Type [employer] MEDICARE    Progress Note Due on Visit 15    PT Start Time 1018    PT Stop Time 1059    PT Time Calculation (min) 41 min    Activity Tolerance Patient limited by pain    Behavior During Therapy [facility] for tasks assessed/performed              Past Medical History:  Diagnosis Date   Arthritis    "had it in my left ankle" (05/04/2017)   Environmental and seasonal allergies 06/07/2015   Fibrosis of subtalar joint, 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   Mild cognitive impairment of uncertain or unknown etiology 09/11/2021   Mixed hyperlipidemia 06/14/2016   Pain in left ankle and joints of left foot 06/17/2017   Pain in rectum    Pneumonia 2017   PTSD (post-traumatic stress disorder) 01/30/2020   Slow transit constipation 04/02/2021   Urinary incontinence    Past Surgical History:  Procedure Laterality Date   ANKLE ARTHROSCOPY Left ~ 2013   "scraped out arthritis"   ANKLE ARTHROSCOPY WITH FUSION Left 09/22/2017   Procedure: LEFT ANKLE POSTERIOR ARTHROSCOPIC SUBTALAR ARTHRODESIS;  Surgeon: Nadara Mustard, MD;  Location: [facility] OR;  Service: Orthopedics;  Laterality: Left;   ANKLE FRACTURE SURGERY Left 12/1980   APPLICATION OF WOUND VAC Right 05/03/2017   foot    APPLICATION OF WOUND VAC Right 05/03/2017   Procedure: APPLICATION OF WOUND VAC;  Surgeon: Eldred Manges, MD;  Location: [facility] OR;  Service: Orthopedics;  Laterality: Right;   FRACTURE SURGERY     I & D EXTREMITY Right 05/03/2017   foot   I & D EXTREMITY Right 05/03/2017   Procedure: IRRIGATION AND DEBRIDEMENT EXTREMITY RIGHT, REPAIR OF POSTERIOR TIBIAL TENDON;  Surgeon: Eldred Manges, MD;  Location: [facility] OR;  Service: Orthopedics;  Laterality: Right;   LAPAROSCOPIC CHOLECYSTECTOMY  1996?   POSTERIOR TIBIAL TENDON REPAIR Right 05/03/2017   SUBTALAR JOINT ARTHROEREISIS Left 1985?   TONSILLECTOMY     Patient Active Problem List   Diagnosis Date Noted   GERD (gastroesophageal reflux disease) 09/11/2021   Mild cognitive impairment of uncertain or unknown etiology 09/11/2021   Urinary incontinence    Pain in rectum    Major depressive disorder    History of concussion    Malignant melanoma of skin of back 04/16/2021   Slow transit constipation 04/02/2021   PTSD (post-traumatic stress disorder) 01/30/2020   Pain in left ankle and joints of left foot 06/17/2017   Fibrosis of subtalar joint, left 01/21/2017   Mixed hyperlipidemia 06/14/2016   Environmental and seasonal allergies 06/07/2015   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   Generalized anxiety  disorder 06/07/2015    PCP: Eartha Inch, MD   REFERRING PROVIDER: Francena Hanly, MD  REFERRING DIAG: [phone] (ICD-10-CM) - Impingement syndrome of left shoulder  THERAPY DIAG:  Acute pain of left shoulder  Stiffness of left shoulder, not elsewhere classified  Muscle weakness (generalized)  Cramp and spasm  Abnormal posture  Rationale for Evaluation and Treatment: Rehabilitation  ONSET DATE: 01/26/2023  SUBJECTIVE:                                                                                                                                                                                      SUBJECTIVE  STATEMENT: No pain unless I (Patient actively extends shoulder)  or reach OH  Hand dominance: Ambidextrous  PERTINENT HISTORY: na  PAIN:  Are you having pain?  0-8/10 only if moving the shoulder  PRECAUTIONS: Other: no active motion and limit elbow extension past neutral: see protocol  WEIGHT BEARING RESTRICTIONS: Yes no weight through involved UE  FALLS:  Has patient fallen in last 6 months? No  LIVING ENVIRONMENT: Lives with: lives with their spouse Lives in: House/apartment  OCCUPATION: desk  PLOF: Independent, Independent with basic ADLs, Independent with household mobility without device, Independent with community mobility without device, Independent with homemaking with ambulation, Independent with gait, and Independent with transfers  PATIENT GOALS: He hopes to have full use of his arm and be able to resume his prior level of function.    NEXT MD VISIT: 6 weeks post op  OBJECTIVE:   DIAGNOSTIC FINDINGS:  na  PATIENT SURVEYS :  FOTO 40, goal is 66  COGNITION: Overall cognitive status: Within functional limits for tasks assessed     SENSATION: WFL  POSTURE: Rounded shoulders  UPPER EXTREMITY ROM:   Passive ROM Right eval Left eval  Shoulder flexion  90  Shoulder extension    Shoulder abduction  70  Shoulder adduction    Shoulder internal rotation  30  Shoulder external rotation  30  Elbow flexion  90  Elbow extension  90  (Blank rows = not tested)  UPPER EXTREMITY MMT:  Deferred on initial eval  MMT Right eval Left eval  Shoulder flexion    Shoulder extension    Shoulder abduction    Shoulder adduction    Shoulder internal rotation    Shoulder external rotation    Middle trapezius    Lower trapezius    Elbow flexion    Elbow extension    Wrist flexion    Wrist extension    Wrist ulnar deviation    Wrist radial deviation    Wrist pronation    Wrist supination    Grip  strength (lbs)    (Blank rows = not tested)    TODAY'S  TREATMENT:                                                                                                                                         DATE:  02/25/23 PROM left shoulder : flexion, abduction, ER, IR; PROM pro/sup with elbow bent STM to left deltoid/upper arm Ice to left shoulder in hook lying with bolster under knees, folded towels under elbow/shoulder x 10 min  02/23/23 PROM left shoulder : flexion, abduction, ER, IR; PROM pro/sup with elbow bent Ice to left shoulder in hook lying with bolster under knees, folded towels under elbow/shoulder x 10 min  02/18/23 PROM left shoulder : flexion, abduction, ER, IR; PROM pro/sup with elbow bent Ice to left shoulder in hook lying with bolster under knees, folded towels under elbow/shoulder x 10 min Self Care: Discussed side effects of Cyclobenzaprine with his anti-depressants. Pt to discuss with MD and pharmacist.  02/16/23 PROM left shoulder : flexion, abduction, ER, IR Reviewed HEP and precautions Ice to left shoulder in hook lying with bolster under knees, folded towels under elbow/shoulder x 10 min  DATE: 02/09/23 Initial eval completed and initiated HEP PROM left shoulder : flexion, abduction, ER, IR Ice to left shoulder in hook lying with bolster under knees, folded towels under elbow/shoulder x 10 min  PATIENT EDUCATION: Education details: Pain control, HEP Person educated: Patient Education method: Programmer, multimedia, Demonstration, and Verbal cues Education comprehension: verbalized understanding and returned demonstration  HOME EXERCISE PROGRAM: Pendulums with elbow flexed, ball squeezes, wrist flexion/extension AROM  ASSESSMENT:  CLINICAL IMPRESSION: Ulice states that he doesn't have pain unless he extends his arm or reaches [facility] which Pt reminded him he should not be doing. He says for the most part he is not. Good tolerance to PROM. Manual therapy to upper arm was somewhat painful, however patient did not relay this to PT until  ice was applied. Advised pt that feedback is important going forward with treatment, especially when we start exercise.    OBJECTIVE IMPAIRMENTS: decreased mobility, decreased ROM, decreased strength, hypomobility, increased fascial restrictions, increased muscle spasms, impaired UE functional use, postural dysfunction, and pain.   ACTIVITY LIMITATIONS: carrying, lifting, transfers, bed mobility, bathing, toileting, dressing, reach over head, and hygiene/grooming  PARTICIPATION LIMITATIONS: meal prep, cleaning, laundry, driving, shopping, community activity, occupation, and yard work  PERSONAL FACTORS: Fitness, Past/current experiences, and 1-2 comorbidities: anxiety, depression, PTSD  are also affecting patient's functional outcome.   REHAB POTENTIAL: Good  CLINICAL DECISION MAKING: Stable/uncomplicated  EVALUATION COMPLEXITY: Low  GOALS: Goals reviewed with patient? Yes  SHORT TERM GOALS: Target date: 03/09/2023   Pain report to be no greater than 4/10  Baseline: Goal status: IN PROGRESS  2.  Patient will be independent with initial HEP  Baseline:  Goal status: INITIAL  3.  ROM to improve  to protocol limits Baseline:  Goal status: MET 02/23/23  LONG TERM GOALS: Target date: 04/06/2023    Patient to report pain no greater than 2/10  Baseline:  Goal status: INITIAL  2.  Patient to be independent with advanced HEP  Baseline:  Goal status: INITIAL  3.  Patient to report 50% improvement in overall symptoms and use of left UE Baseline:  Goal status: INITIAL  4.  FOTO to be 67 Baseline: 41 Goal status: INITIAL  5.  ROM to be [facility] Baseline:  Goal status: INITIAL  6.  Strength to be 4 to 4+/5 all motions of left shoulder Baseline:  Goal status: INITIAL  PLAN: PT FREQUENCY:  3 times per week for first 3-4 weeks then 2 times per week.    PT DURATION: 8 weeks  PLANNED INTERVENTIONS: Therapeutic exercises, Therapeutic activity, Neuromuscular re-education, Balance  training, Gait training, Patient/Family education, Self Care, Joint mobilization, Aquatic Therapy, Dry Needling, Electrical stimulation, Cryotherapy, Moist heat, scar mobilization, Taping, Vasopneumatic device, Traction, Ultrasound, Ionotophoresis 4mg /ml Dexamethasone, Manual therapy, and Re-evaluation  PLAN FOR NEXT SESSION: Continue PROM only until 03/12/23, then follow protocol   Jennifer B. Fields, PT 02/25/23 1:46 PM [facility] [address], [address] [place], [place] [address] Phone # [phone] Fax [phone]

## 2023-02-25 ENCOUNTER — Ambulatory Visit: Payer: Medicare Other | Admitting: Physical Therapy

## 2023-02-25 ENCOUNTER — Encounter: Payer: Self-pay | Admitting: Physical Therapy

## 2023-02-25 DIAGNOSIS — M25512 Pain in left shoulder: Secondary | ICD-10-CM

## 2023-02-25 DIAGNOSIS — M25612 Stiffness of left shoulder, not elsewhere classified: Secondary | ICD-10-CM

## 2023-02-25 DIAGNOSIS — R293 Abnormal posture: Secondary | ICD-10-CM

## 2023-02-25 DIAGNOSIS — M6281 Muscle weakness (generalized): Secondary | ICD-10-CM

## 2023-02-25 DIAGNOSIS — R252 Cramp and spasm: Secondary | ICD-10-CM

## 2023-03-02 ENCOUNTER — Ambulatory Visit: Payer: Medicare Other

## 2023-03-02 DIAGNOSIS — M25512 Pain in left shoulder: Secondary | ICD-10-CM | POA: Diagnosis not present

## 2023-03-02 DIAGNOSIS — R293 Abnormal posture: Secondary | ICD-10-CM

## 2023-03-02 DIAGNOSIS — M25612 Stiffness of left shoulder, not elsewhere classified: Secondary | ICD-10-CM

## 2023-03-02 DIAGNOSIS — M6281 Muscle weakness (generalized): Secondary | ICD-10-CM

## 2023-03-02 DIAGNOSIS — R252 Cramp and spasm: Secondary | ICD-10-CM

## 2023-03-02 NOTE — Therapy (Signed)
OUTPATIENT PHYSICAL THERAPY UPPER EXTREMITY TREATMENT NOTE   Patient Name: Tom White MRN: [id number] DOB:1961/02/07, 62 y.o., male Today's Date: 03/02/2023  END OF SESSION:  PT End of Session - 03/02/23 1109     Visit Number 6    Number of Visits 16    Date for PT Re-Evaluation 04/06/23    Authorization Type [employer] MEDICARE    Progress Note Due on Visit 15    PT Start Time 1108    PT Stop Time 1141    PT Time Calculation (min) 33 min    Activity Tolerance Patient tolerated treatment well    Behavior During Therapy WFL for tasks assessed/performed              Past Medical History:  Diagnosis Date   Arthritis    "had it in my left ankle" (05/04/2017)   Environmental and seasonal allergies 06/07/2015   Fibrosis of subtalar joint, 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   Mild cognitive impairment of uncertain or unknown etiology 09/11/2021   Mixed hyperlipidemia 06/14/2016   Pain in left ankle and joints of left foot 06/17/2017   Pain in rectum    Pneumonia 2017   PTSD (post-traumatic stress disorder) 01/30/2020   Slow transit constipation 04/02/2021   Urinary incontinence    Past Surgical History:  Procedure Laterality Date   ANKLE ARTHROSCOPY Left ~ 2013   "scraped out arthritis"   ANKLE ARTHROSCOPY WITH FUSION Left 09/22/2017   Procedure: LEFT ANKLE POSTERIOR ARTHROSCOPIC SUBTALAR ARTHRODESIS;  Surgeon: Nadara Mustard, MD;  Location: [facility] OR;  Service: Orthopedics;  Laterality: Left;   ANKLE FRACTURE SURGERY Left 12/1980   APPLICATION OF WOUND VAC Right 05/03/2017    foot   APPLICATION OF WOUND VAC Right 05/03/2017   Procedure: APPLICATION OF WOUND VAC;  Surgeon: Eldred Manges, MD;  Location: [facility] OR;  Service: Orthopedics;  Laterality: Right;   FRACTURE SURGERY     I & D EXTREMITY Right 05/03/2017   foot   I & D EXTREMITY Right 05/03/2017   Procedure: IRRIGATION AND DEBRIDEMENT EXTREMITY RIGHT, REPAIR OF POSTERIOR TIBIAL TENDON;  Surgeon: Eldred Manges, MD;  Location: [facility] OR;  Service: Orthopedics;  Laterality: Right;   LAPAROSCOPIC CHOLECYSTECTOMY  1996?   POSTERIOR TIBIAL TENDON REPAIR Right 05/03/2017   SUBTALAR JOINT ARTHROEREISIS Left 1985?   TONSILLECTOMY     Patient Active Problem List   Diagnosis Date Noted   GERD (gastroesophageal reflux disease) 09/11/2021   Mild cognitive impairment of uncertain or unknown etiology 09/11/2021   Urinary incontinence    Pain in rectum    Major depressive disorder    History of concussion    Malignant melanoma of skin of back 04/16/2021   Slow transit constipation 04/02/2021   PTSD (post-traumatic stress disorder) 01/30/2020   Pain in left ankle and joints of left foot 06/17/2017   Fibrosis of subtalar joint, left 01/21/2017   Mixed hyperlipidemia 06/14/2016   Environmental and seasonal allergies 06/07/2015   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   Generalized anxiety  disorder 06/07/2015    PCP: Eartha Inch, MD   REFERRING PROVIDER: Francena Hanly, MD  REFERRING DIAG: [id number] (ICD-10-CM) - Impingement syndrome of left shoulder  THERAPY DIAG:  Acute pain of left shoulder  Stiffness of left shoulder, not elsewhere classified  Muscle weakness (generalized)  Cramp and spasm  Abnormal posture  Rationale for Evaluation and Treatment: Rehabilitation  ONSET DATE: 01/26/2023  SUBJECTIVE:                                                                                                                                                                                      SUBJECTIVE  STATEMENT: Patient reports still very little pain "unless I move it wrong".   Reminded patient about precautions and protocol.  He shouldn't be using or moving his arm actively.    Hand dominance: Ambidextrous  PERTINENT HISTORY: na  PAIN:  03/02/23: Are you having pain?  0-5/10 only if moving the shoulder  PRECAUTIONS: Other: no active motion and limit elbow extension past neutral: see protocol  WEIGHT BEARING RESTRICTIONS: Yes no weight through involved UE  FALLS:  Has patient fallen in last 6 months? No  LIVING ENVIRONMENT: Lives with: lives with their spouse Lives in: House/apartment  OCCUPATION: desk  PLOF: Independent, Independent with basic ADLs, Independent with household mobility without device, Independent with community mobility without device, Independent with homemaking with ambulation, Independent with gait, and Independent with transfers  PATIENT GOALS: He hopes to have full use of his arm and be able to resume his prior level of function.    NEXT MD VISIT: 6 weeks post op  OBJECTIVE:   DIAGNOSTIC FINDINGS:  na  PATIENT SURVEYS :  FOTO 40, goal is 71  COGNITION: Overall cognitive status: Within functional limits for tasks assessed     SENSATION: WFL  POSTURE: Rounded shoulders  UPPER EXTREMITY ROM:   Passive ROM Right eval Left eval  Shoulder flexion  90  Shoulder extension    Shoulder abduction  70  Shoulder adduction    Shoulder internal rotation  30  Shoulder external rotation  30  Elbow flexion  90  Elbow extension  90  (Blank rows = not tested)  UPPER EXTREMITY MMT:  Deferred on initial eval  MMT Right eval Left eval  Shoulder flexion    Shoulder extension    Shoulder abduction    Shoulder adduction    Shoulder internal rotation    Shoulder external rotation    Middle trapezius    Lower trapezius    Elbow flexion    Elbow extension    Wrist flexion    Wrist extension    Wrist ulnar deviation  Wrist radial  deviation    Wrist pronation    Wrist supination    Grip strength (lbs)    (Blank rows = not tested)    TODAY'S TREATMENT:                                                                                                                                         DATE:  03/02/23 Reviewed pendulums: 20 each of fwd/back, side/side, cw/ccw  PROM left shoulder : flexion, abduction, ER, IR; PROM pro/sup with elbow bent Ice to left shoulder in hook lying with bolster under knees, folded towels under elbow/shoulder x 10 min  02/25/23 PROM left shoulder : flexion, abduction, ER, IR; PROM pro/sup with elbow bent STM to left deltoid/upper arm Ice to left shoulder in hook lying with bolster under knees, folded towels under elbow/shoulder x 10 min  02/23/23 PROM left shoulder : flexion, abduction, ER, IR; PROM pro/sup with elbow bent Ice to left shoulder in hook lying with bolster under knees, folded towels under elbow/shoulder x 10 min   PATIENT EDUCATION: Education details: Pain control, HEP Person educated: Patient Education method: Programmer, multimedia, Demonstration, and Verbal cues Education comprehension: verbalized understanding and returned demonstration  HOME EXERCISE PROGRAM: Pendulums with elbow flexed, ball squeezes, wrist flexion/extension AROM  ASSESSMENT:  CLINICAL IMPRESSION: Tom White is progressing appropriately.   Good tolerance to PROM.  Needs reminders regarding protocol limits.    OBJECTIVE IMPAIRMENTS: decreased mobility, decreased ROM, decreased strength, hypomobility, increased fascial restrictions, increased muscle spasms, impaired UE functional use, postural dysfunction, and pain.   ACTIVITY LIMITATIONS: carrying, lifting, transfers, bed mobility, bathing, toileting, dressing, reach over head, and hygiene/grooming  PARTICIPATION LIMITATIONS: meal prep, cleaning, laundry, driving, shopping, community activity, occupation, and yard work  PERSONAL FACTORS: Fitness, Past/current  experiences, and 1-2 comorbidities: anxiety, depression, PTSD  are also affecting patient's functional outcome.   REHAB POTENTIAL: Good  CLINICAL DECISION MAKING: Stable/uncomplicated  EVALUATION COMPLEXITY: Low  GOALS: Goals reviewed with patient? Yes  SHORT TERM GOALS: Target date: 03/09/2023   Pain report to be no greater than 4/10  Baseline: Goal status: IN PROGRESS  2.  Patient will be independent with initial HEP  Baseline:  Goal status: INITIAL  3.  ROM to improve to protocol limits Baseline:  Goal status: MET 02/23/23  LONG TERM GOALS: Target date: 04/06/2023    Patient to report pain no greater than 2/10  Baseline:  Goal status: INITIAL  2.  Patient to be independent with advanced HEP  Baseline:  Goal status: INITIAL  3.  Patient to report 50% improvement in overall symptoms and use of left UE Baseline:  Goal status: INITIAL  4.  FOTO to be 67 Baseline: 41 Goal status: INITIAL  5.  ROM to be [facility] Baseline:  Goal status: INITIAL  6.  Strength to be 4 to 4+/5 all motions of left shoulder Baseline:  Goal status: INITIAL  PLAN: PT FREQUENCY:  3 times per week for first 3-4 weeks then 2 times per week.    PT DURATION: 8 weeks  PLANNED INTERVENTIONS: Therapeutic exercises, Therapeutic activity, Neuromuscular re-education, Balance training, Gait training, Patient/Family education, Self Care, Joint mobilization, Aquatic Therapy, Dry Needling, Electrical stimulation, Cryotherapy, Moist heat, scar mobilization, Taping, Vasopneumatic device, Traction, Ultrasound, Ionotophoresis 4mg /ml Dexamethasone, Manual therapy, and Re-evaluation  PLAN FOR NEXT SESSION: Continue PROM only until 03/12/23, then follow protocol   Larsen Zettel B. Shaya Altamura, PT 03/02/23 11:40 AM  [facility] [address] [place], [place] [address] Phone # [phone] Fax [phone]

## 2023-03-04 ENCOUNTER — Ambulatory Visit: Payer: Medicare Other

## 2023-03-04 DIAGNOSIS — M25512 Pain in left shoulder: Secondary | ICD-10-CM | POA: Diagnosis not present

## 2023-03-04 DIAGNOSIS — M6281 Muscle weakness (generalized): Secondary | ICD-10-CM

## 2023-03-04 DIAGNOSIS — R252 Cramp and spasm: Secondary | ICD-10-CM

## 2023-03-04 DIAGNOSIS — M25612 Stiffness of left shoulder, not elsewhere classified: Secondary | ICD-10-CM

## 2023-03-04 DIAGNOSIS — R293 Abnormal posture: Secondary | ICD-10-CM

## 2023-03-04 NOTE — Therapy (Signed)
OUTPATIENT PHYSICAL THERAPY UPPER EXTREMITY TREATMENT NOTE   Patient Name: Tom White MRN: [id number] DOB:Jun 15, 1961, 62 y.o., male Today's Date: 03/04/2023  END OF SESSION:  PT End of Session - 03/04/23 0941     Visit Number 7    Number of Visits 16    Date for PT Re-Evaluation 04/06/23    Authorization Type [employer] MEDICARE    Progress Note Due on Visit 15    PT Start Time 0935    PT Stop Time 1000    PT Time Calculation (min) 25 min    Activity Tolerance Patient tolerated treatment well    Behavior During Therapy [facility] for tasks assessed/performed              Past Medical History:  Diagnosis Date   Arthritis    "had it in my left ankle" (05/04/2017)   Environmental and seasonal allergies 06/07/2015   Fibrosis of subtalar joint, 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   Mild cognitive impairment of uncertain or unknown etiology 09/11/2021   Mixed hyperlipidemia 06/14/2016   Pain in left ankle and joints of left foot 06/17/2017   Pain in rectum    Pneumonia 2017   PTSD (post-traumatic stress disorder) 01/30/2020   Slow transit constipation 04/02/2021   Urinary incontinence    Past Surgical History:  Procedure Laterality Date   ANKLE ARTHROSCOPY Left ~ 2013   "scraped out arthritis"   ANKLE ARTHROSCOPY WITH FUSION Left 09/22/2017   Procedure: LEFT ANKLE POSTERIOR ARTHROSCOPIC SUBTALAR ARTHRODESIS;  Surgeon: Nadara Mustard, MD;  Location: [facility] OR;  Service: Orthopedics;  Laterality: Left;   ANKLE FRACTURE SURGERY Left 12/1980   APPLICATION OF WOUND VAC Right 05/03/2017    foot   APPLICATION OF WOUND VAC Right 05/03/2017   Procedure: APPLICATION OF WOUND VAC;  Surgeon: Eldred Manges, MD;  Location: [facility] OR;  Service: Orthopedics;  Laterality: Right;   FRACTURE SURGERY     I & D EXTREMITY Right 05/03/2017   foot   I & D EXTREMITY Right 05/03/2017   Procedure: IRRIGATION AND DEBRIDEMENT EXTREMITY RIGHT, REPAIR OF POSTERIOR TIBIAL TENDON;  Surgeon: Eldred Manges, MD;  Location: [facility] OR;  Service: Orthopedics;  Laterality: Right;   LAPAROSCOPIC CHOLECYSTECTOMY  1996?   POSTERIOR TIBIAL TENDON REPAIR Right 05/03/2017   SUBTALAR JOINT ARTHROEREISIS Left 1985?   TONSILLECTOMY     Patient Active Problem List   Diagnosis Date Noted   GERD (gastroesophageal reflux disease) 09/11/2021   Mild cognitive impairment of uncertain or unknown etiology 09/11/2021   Urinary incontinence    Pain in rectum    Major depressive disorder    History of concussion    Malignant melanoma of skin of back 04/16/2021   Slow transit constipation 04/02/2021   PTSD (post-traumatic stress disorder) 01/30/2020   Pain in left ankle and joints of left foot 06/17/2017   Fibrosis of subtalar joint, left 01/21/2017   Mixed hyperlipidemia 06/14/2016   Environmental and seasonal allergies 06/07/2015   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   Generalized anxiety  disorder 06/07/2015    PCP: Eartha Inch, MD   REFERRING PROVIDER: Francena Hanly, MD  REFERRING DIAG: [id number] (ICD-10-CM) - Impingement syndrome of left shoulder  THERAPY DIAG:  Acute pain of left shoulder  Stiffness of left shoulder, not elsewhere classified  Muscle weakness (generalized)  Cramp and spasm  Abnormal posture  Rationale for Evaluation and Treatment: Rehabilitation  ONSET DATE: 01/26/2023  SUBJECTIVE:                                                                                                                                                                                      SUBJECTIVE  STATEMENT: Patient reports no pain.  He arrives in sling but upon taking it off he uses both hands to toss it to the chair.  He also pushes himself up on the treatment table to adjust his weight.  We discuss, again, the importance of adhering to the precautions and protocol limits to avoid re-injury.  Pain reported at 0/10.  Hand dominance: Ambidextrous  PERTINENT HISTORY: na  PAIN:  03/04/23: Are you having pain?  0/10 only if moving the shoulder , taking Naproxen 1-2 times per day only  PRECAUTIONS: Other: no active motion and limit elbow extension past neutral: see protocol  WEIGHT BEARING RESTRICTIONS: Yes no weight through involved UE  FALLS:  Has patient fallen in last 6 months? No  LIVING ENVIRONMENT: Lives with: lives with their spouse Lives in: House/apartment  OCCUPATION: desk  PLOF: Independent, Independent with basic ADLs, Independent with household mobility without device, Independent with community mobility without device, Independent with homemaking with ambulation, Independent with gait, and Independent with transfers  PATIENT GOALS: He hopes to have full use of his arm and be able to resume his prior level of function.    NEXT MD VISIT: 6 weeks post op  OBJECTIVE:   DIAGNOSTIC FINDINGS:  na  PATIENT SURVEYS :  FOTO 40, goal is 52  COGNITION: Overall cognitive status: Within functional limits for tasks assessed     SENSATION: WFL  POSTURE: Rounded shoulders  UPPER EXTREMITY ROM:   Passive ROM Right eval Left eval  Shoulder flexion  90  Shoulder extension    Shoulder abduction  70  Shoulder adduction    Shoulder internal rotation  30  Shoulder external rotation  30  Elbow flexion  90  Elbow extension  90  (Blank rows = not tested)  UPPER EXTREMITY MMT:  Deferred on initial eval  MMT Right eval Left eval  Shoulder flexion    Shoulder extension    Shoulder abduction    Shoulder adduction    Shoulder internal rotation    Shoulder  external rotation    Middle trapezius    Lower trapezius    Elbow flexion    Elbow extension    Wrist flexion    Wrist extension    Wrist ulnar deviation    Wrist radial deviation    Wrist pronation    Wrist supination    Grip strength (lbs)    (Blank rows = not tested)    TODAY'S TREATMENT:                                                                                                                                         DATE:  03/04/23 Pendulums: 20 each of fwd/back, side/side, cw/ccw  PROM left shoulder : flexion, abduction, ER, IR; PROM pro/sup with elbow bent  03/02/23 Reviewed pendulums: 20 each of fwd/back, side/side, cw/ccw  PROM left shoulder : flexion, abduction, ER, IR; PROM pro/sup with elbow bent Ice to left shoulder in hook lying with bolster under knees, folded towels under elbow/shoulder x 10 min  02/25/23 PROM left shoulder : flexion, abduction, ER, IR; PROM pro/sup with elbow bent STM to left deltoid/upper arm Ice to left shoulder in hook lying with bolster under knees, folded towels under elbow/shoulder x 10 min  PATIENT EDUCATION: Education details: Pain control, HEP Person educated: Patient Education method: Programmer, multimedia, Demonstration, and Verbal cues Education comprehension: verbalized understanding and returned demonstration  HOME EXERCISE PROGRAM: Pendulums with elbow flexed, ball squeezes, wrist flexion/extension AROM  ASSESSMENT:  CLINICAL IMPRESSION: Zaydon is progressing very well.  He has approx 130 degrees flexion and abduction passively and approx 45 degrees of ER passively.  IR is hand to hip in supine with arm abducted to approx 45 degrees away from his body.  Good tolerance to PROM.  Needs reminders regarding protocol limits. When PT brings sling to patient, he abducts the arm to approx 90 degrees to slide his arm in.  We discuss, once again, the need to adhere to protocol.      OBJECTIVE IMPAIRMENTS: decreased mobility, decreased ROM,  decreased strength, hypomobility, increased fascial restrictions, increased muscle spasms, impaired UE functional use, postural dysfunction, and pain.   ACTIVITY LIMITATIONS: carrying, lifting, transfers, bed mobility, bathing, toileting, dressing, reach over head, and hygiene/grooming  PARTICIPATION LIMITATIONS: meal prep, cleaning, laundry, driving, shopping, community activity, occupation, and yard work  PERSONAL FACTORS: Fitness, Past/current experiences, and 1-2 comorbidities: anxiety, depression, PTSD  are also affecting patient's functional outcome.   REHAB POTENTIAL: Good  CLINICAL DECISION MAKING: Stable/uncomplicated  EVALUATION COMPLEXITY: Low  GOALS: Goals reviewed with patient? Yes  SHORT TERM GOALS: Target date: 03/09/2023   Pain report to be no greater than 4/10  Baseline: Goal status: IN PROGRESS  2.  Patient will be independent with initial HEP  Baseline:  Goal status: MET 03/04/23  3.  ROM to improve to protocol limits Baseline:  Goal status: MET 02/23/23  LONG TERM GOALS: Target date:  04/06/2023    Patient to report pain no greater than 2/10  Baseline:  Goal status: INITIAL  2.  Patient to be independent with advanced HEP  Baseline:  Goal status: INITIAL  3.  Patient to report 50% improvement in overall symptoms and use of left UE Baseline:  Goal status: INITIAL  4.  FOTO to be 67 Baseline: 41 Goal status: INITIAL  5.  ROM to be [facility] Baseline:  Goal status: INITIAL  6.  Strength to be 4 to 4+/5 all motions of left shoulder Baseline:  Goal status: INITIAL  PLAN: PT FREQUENCY:  3 times per week for first 3-4 weeks then 2 times per week.   Updated 03/04/23 patient is ahead of protocol, decreasing frequency to 1 time per week until patient is 6 weeks post op.    PT DURATION: 8 weeks  PLANNED INTERVENTIONS: Therapeutic exercises, Therapeutic activity, Neuromuscular re-education, Balance training, Gait training, Patient/Family education, Self Care,  Joint mobilization, Aquatic Therapy, Dry Needling, Electrical stimulation, Cryotherapy, Moist heat, scar mobilization, Taping, Vasopneumatic device, Traction, Ultrasound, Ionotophoresis 4mg /ml Dexamethasone, Manual therapy, and Re-evaluation  PLAN FOR NEXT SESSION: Continue PROM only until 03/12/23, then follow protocol.     Victorino Dike B. Remmington Teters, PT 03/04/23 10:11 AM  [facility] [address] [place], [place] [address] Phone # [phone] Fax [phone]

## 2023-03-08 ENCOUNTER — Ambulatory Visit: Payer: Medicare Other | Attending: Orthopedic Surgery

## 2023-03-08 DIAGNOSIS — R293 Abnormal posture: Secondary | ICD-10-CM | POA: Insufficient documentation

## 2023-03-08 DIAGNOSIS — M6281 Muscle weakness (generalized): Secondary | ICD-10-CM | POA: Diagnosis present

## 2023-03-08 DIAGNOSIS — R252 Cramp and spasm: Secondary | ICD-10-CM | POA: Diagnosis present

## 2023-03-08 DIAGNOSIS — M25512 Pain in left shoulder: Secondary | ICD-10-CM | POA: Insufficient documentation

## 2023-03-08 DIAGNOSIS — M25612 Stiffness of left shoulder, not elsewhere classified: Secondary | ICD-10-CM | POA: Diagnosis present

## 2023-03-08 NOTE — Therapy (Signed)
OUTPATIENT PHYSICAL THERAPY UPPER EXTREMITY TREATMENT NOTE   Patient Name: Tom White MRN: [id number] DOB:1961/02/13, 62 y.o., male Today's Date: 03/08/2023  END OF SESSION:  PT End of Session - 03/08/23 1058     Visit Number 8    Number of Visits 16    Date for PT Re-Evaluation 04/06/23    Authorization Type [employer] MEDICARE    PT Start Time 1059    PT Stop Time 1133    PT Time Calculation (min) 34 min    Activity Tolerance Patient tolerated treatment well    Behavior During Therapy WFL for tasks assessed/performed              Past Medical History:  Diagnosis Date   Arthritis    "had it in my left ankle" (05/04/2017)   Environmental and seasonal allergies 06/07/2015   Fibrosis of subtalar joint, 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   Mild cognitive impairment of uncertain or unknown etiology 09/11/2021   Mixed hyperlipidemia 06/14/2016   Pain in left ankle and joints of left foot 06/17/2017   Pain in rectum    Pneumonia 2017   PTSD (post-traumatic stress disorder) 01/30/2020   Slow transit constipation 04/02/2021   Urinary incontinence    Past Surgical History:  Procedure Laterality Date   ANKLE ARTHROSCOPY Left ~ 2013   "scraped out arthritis"   ANKLE ARTHROSCOPY WITH FUSION Left 09/22/2017   Procedure: LEFT ANKLE POSTERIOR ARTHROSCOPIC SUBTALAR ARTHRODESIS;  Surgeon: Nadara Mustard, MD;  Location: [facility] OR;  Service: Orthopedics;  Laterality: Left;   ANKLE FRACTURE SURGERY Left 12/1980   APPLICATION OF WOUND VAC Right 05/03/2017   foot   APPLICATION OF WOUND VAC  Right 05/03/2017   Procedure: APPLICATION OF WOUND VAC;  Surgeon: Eldred Manges, MD;  Location: [facility] OR;  Service: Orthopedics;  Laterality: Right;   FRACTURE SURGERY     I & D EXTREMITY Right 05/03/2017   foot   I & D EXTREMITY Right 05/03/2017   Procedure: IRRIGATION AND DEBRIDEMENT EXTREMITY RIGHT, REPAIR OF POSTERIOR TIBIAL TENDON;  Surgeon: Eldred Manges, MD;  Location: [facility] OR;  Service: Orthopedics;  Laterality: Right;   LAPAROSCOPIC CHOLECYSTECTOMY  1996?   POSTERIOR TIBIAL TENDON REPAIR Right 05/03/2017   SUBTALAR JOINT ARTHROEREISIS Left 1985?   TONSILLECTOMY     Patient Active Problem List   Diagnosis Date Noted   GERD (gastroesophageal reflux disease) 09/11/2021   Mild cognitive impairment of uncertain or unknown etiology 09/11/2021   Urinary incontinence    Pain in rectum    Major depressive disorder    History of concussion    Malignant melanoma of skin of back 04/16/2021   Slow transit constipation 04/02/2021   PTSD (post-traumatic stress disorder) 01/30/2020   Pain in left ankle and joints of left foot 06/17/2017   Fibrosis of subtalar joint, left 01/21/2017   Mixed hyperlipidemia 06/14/2016   Environmental and seasonal allergies 06/07/2015   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   Generalized anxiety disorder 06/07/2015    PCP: Eartha Inch,  MD   REFERRING PROVIDER: Francena Hanly, MD  REFERRING DIAG: [phone] (ICD-10-CM) - Impingement syndrome of left shoulder  THERAPY DIAG:  Acute pain of left shoulder  Stiffness of left shoulder, not elsewhere classified  Muscle weakness (generalized)  Cramp and spasm  Abnormal posture  Rationale for Evaluation and Treatment: Rehabilitation  ONSET DATE: 01/26/2023  SUBJECTIVE:                                                                                                                                                                                      SUBJECTIVE STATEMENT: Patient reports no  pain.  "Doing fine"  Hand dominance: Ambidextrous  PERTINENT HISTORY: na  PAIN:  03/08/23: Are you having pain?  0/10 only if moving the shoulder , taking Naproxen 1-2 times per day only  PRECAUTIONS: Other: no active motion and limit elbow extension past neutral: see protocol  WEIGHT BEARING RESTRICTIONS: Yes no weight through involved UE  FALLS:  Has patient fallen in last 6 months? No  LIVING ENVIRONMENT: Lives with: lives with their spouse Lives in: House/apartment  OCCUPATION: desk  PLOF: Independent, Independent with basic ADLs, Independent with household mobility without device, Independent with community mobility without device, Independent with homemaking with ambulation, Independent with gait, and Independent with transfers  PATIENT GOALS: He hopes to have full use of his arm and be able to resume his prior level of function.    NEXT MD VISIT: 6 weeks post op  OBJECTIVE:   DIAGNOSTIC FINDINGS:  na  PATIENT SURVEYS :  FOTO 40, goal is 48  COGNITION: Overall cognitive status: Within functional limits for tasks assessed     SENSATION: WFL  POSTURE: Rounded shoulders  UPPER EXTREMITY ROM:   Passive ROM Right eval Left eval  Shoulder flexion  90  Shoulder extension    Shoulder abduction  70  Shoulder adduction    Shoulder internal rotation  30  Shoulder external rotation  30  Elbow flexion  90  Elbow extension  90  (Blank rows = not tested)  UPPER EXTREMITY MMT:  Deferred on initial eval  MMT Right eval Left eval  Shoulder flexion    Shoulder extension    Shoulder abduction    Shoulder adduction    Shoulder internal rotation    Shoulder external rotation    Middle trapezius    Lower trapezius    Elbow flexion    Elbow extension    Wrist flexion    Wrist extension    Wrist ulnar deviation    Wrist radial deviation    Wrist pronation    Wrist supination    Grip strength (lbs)    (  Blank rows = not tested)    TODAY'S TREATMENT:                                                                                                                                          DATE:  03/08/23 Pendulums: 20 each of fwd/back, side/side, cw/ccw  PROM left shoulder : flexion, abduction, ER, IR; PROM pro/sup with elbow bent  DATE:  03/04/23 Pendulums: 20 each of fwd/back, side/side, cw/ccw  PROM left shoulder : flexion, abduction, ER, IR; PROM pro/sup with elbow bent  03/02/23 Reviewed pendulums: 20 each of fwd/back, side/side, cw/ccw  PROM left shoulder : flexion, abduction, ER, IR; PROM pro/sup with elbow bent Ice to left shoulder in hook lying with bolster under knees, folded towels under elbow/shoulder x 10 min   PATIENT EDUCATION: Education details: Pain control, HEP Person educated: Patient Education method: Programmer, multimedia, Demonstration, and Verbal cues Education comprehension: verbalized understanding and returned demonstration  HOME EXERCISE PROGRAM: Pendulums with elbow flexed, ball squeezes, wrist flexion/extension AROM  ASSESSMENT:  CLINICAL IMPRESSION: Pieter continues to progress appropriately.  He appears to be more aware of precautions.    Good tolerance to PROM.  Needs less reminders regarding protocol limits.   OBJECTIVE IMPAIRMENTS: decreased mobility, decreased ROM, decreased strength, hypomobility, increased fascial restrictions, increased muscle spasms, impaired UE functional use, postural dysfunction, and pain.   ACTIVITY LIMITATIONS: carrying, lifting, transfers, bed mobility, bathing, toileting, dressing, reach over head, and hygiene/grooming  PARTICIPATION LIMITATIONS: meal prep, cleaning, laundry, driving, shopping, community activity, occupation, and yard work  PERSONAL FACTORS: Fitness, Past/current experiences, and 1-2 comorbidities: anxiety, depression, PTSD  are also affecting patient's functional outcome.   REHAB POTENTIAL: Good  CLINICAL DECISION MAKING: Stable/uncomplicated  EVALUATION  COMPLEXITY: Low  GOALS: Goals reviewed with patient? Yes  SHORT TERM GOALS: Target date: 03/09/2023   Pain report to be no greater than 4/10  Baseline: Goal status: IN PROGRESS  2.  Patient will be independent with initial HEP  Baseline:  Goal status: MET 03/04/23  3.  ROM to improve to protocol limits Baseline:  Goal status: MET 02/23/23  LONG TERM GOALS: Target date: 04/06/2023    Patient to report pain no greater than 2/10  Baseline:  Goal status: INITIAL  2.  Patient to be independent with advanced HEP  Baseline:  Goal status: INITIAL  3.  Patient to report 50% improvement in overall symptoms and use of left UE Baseline:  Goal status: INITIAL  4.  FOTO to be 67 Baseline: 41 Goal status: INITIAL  5.  ROM to be [facility] Baseline:  Goal status: INITIAL  6.  Strength to be 4 to 4+/5 all motions of left shoulder Baseline:  Goal status: INITIAL  PLAN: PT FREQUENCY:  3 times per week for first 3-4 weeks then 2 times per week.   Updated 03/04/23 patient is ahead of protocol, decreasing frequency to 1 time per  week until patient is 6 weeks post op.    PT DURATION: 8 weeks  PLANNED INTERVENTIONS: Therapeutic exercises, Therapeutic activity, Neuromuscular re-education, Balance training, Gait training, Patient/Family education, Self Care, Joint mobilization, Aquatic Therapy, Dry Needling, Electrical stimulation, Cryotherapy, Moist heat, scar mobilization, Taping, Vasopneumatic device, Traction, Ultrasound, Ionotophoresis 4mg /ml Dexamethasone, Manual therapy, and Re-evaluation  PLAN FOR NEXT SESSION: Continue PROM only until 03/12/23, then follow protocol.     Victorino Dike B. Kortnie Stovall, PT 03/08/23 11:29 AM  [facility] [address], [address] [place], [place] [address] Phone # [phone] Fax [phone]

## 2023-03-11 ENCOUNTER — Encounter: Payer: Medicare Other | Admitting: Physical Therapy

## 2023-03-17 ENCOUNTER — Ambulatory Visit: Payer: Medicare Other

## 2023-03-17 DIAGNOSIS — R252 Cramp and spasm: Secondary | ICD-10-CM

## 2023-03-17 DIAGNOSIS — M25512 Pain in left shoulder: Secondary | ICD-10-CM | POA: Diagnosis not present

## 2023-03-17 DIAGNOSIS — M25612 Stiffness of left shoulder, not elsewhere classified: Secondary | ICD-10-CM

## 2023-03-17 DIAGNOSIS — M6281 Muscle weakness (generalized): Secondary | ICD-10-CM

## 2023-03-17 NOTE — Therapy (Signed)
OUTPATIENT PHYSICAL THERAPY UPPER EXTREMITY TREATMENT NOTE   Patient Name: Tom White MRN: [id number] DOB:April 06, 1961, 62 y.o., male Today's Date: 03/18/2023  END OF SESSION:  PT End of Session - 03/17/23 1503     Visit Number 9    Number of Visits 16    Date for PT Re-Evaluation 04/06/23    Authorization Type [employer] MEDICARE    Progress Note Due on Visit 15    PT Start Time 1452    PT Stop Time 1530    PT Time Calculation (min) 38 min    Activity Tolerance Patient tolerated treatment well    Behavior During Therapy WFL for tasks assessed/performed              Past Medical History:  Diagnosis Date   Arthritis    "had it in my left ankle" (05/04/2017)   Environmental and seasonal allergies 06/07/2015   Fibrosis of subtalar joint, 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   Mild cognitive impairment of uncertain or unknown etiology 09/11/2021   Mixed hyperlipidemia 06/14/2016   Pain in left ankle and joints of left foot 06/17/2017   Pain in rectum    Pneumonia 2017   PTSD (post-traumatic stress disorder) 01/30/2020   Slow transit constipation 04/02/2021   Urinary incontinence    Past Surgical History:  Procedure Laterality Date   ANKLE ARTHROSCOPY Left ~ 2013   "scraped out arthritis"   ANKLE ARTHROSCOPY WITH FUSION Left 09/22/2017   Procedure: LEFT ANKLE POSTERIOR ARTHROSCOPIC SUBTALAR ARTHRODESIS;  Surgeon: Nadara Mustard, MD;  Location: [facility] OR;  Service: Orthopedics;  Laterality: Left;   ANKLE FRACTURE SURGERY Left 12/1980   APPLICATION OF WOUND VAC Right 05/03/2017    foot   APPLICATION OF WOUND VAC Right 05/03/2017   Procedure: APPLICATION OF WOUND VAC;  Surgeon: Eldred Manges, MD;  Location: [facility] OR;  Service: Orthopedics;  Laterality: Right;   FRACTURE SURGERY     I & D EXTREMITY Right 05/03/2017   foot   I & D EXTREMITY Right 05/03/2017   Procedure: IRRIGATION AND DEBRIDEMENT EXTREMITY RIGHT, REPAIR OF POSTERIOR TIBIAL TENDON;  Surgeon: Eldred Manges, MD;  Location: [facility] OR;  Service: Orthopedics;  Laterality: Right;   LAPAROSCOPIC CHOLECYSTECTOMY  1996?   POSTERIOR TIBIAL TENDON REPAIR Right 05/03/2017   SUBTALAR JOINT ARTHROEREISIS Left 1985?   TONSILLECTOMY     Patient Active Problem List   Diagnosis Date Noted   GERD (gastroesophageal reflux disease) 09/11/2021   Mild cognitive impairment of uncertain or unknown etiology 09/11/2021   Urinary incontinence    Pain in rectum    Major depressive disorder    History of concussion    Malignant melanoma of skin of back 04/16/2021   Slow transit constipation 04/02/2021   PTSD (post-traumatic stress disorder) 01/30/2020   Pain in left ankle and joints of left foot 06/17/2017   Fibrosis of subtalar joint, left 01/21/2017   Mixed hyperlipidemia 06/14/2016   Environmental and seasonal allergies 06/07/2015   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   Generalized anxiety  disorder 06/07/2015    PCP: Eartha Inch, MD   REFERRING PROVIDER: Francena Hanly, MD  REFERRING DIAG: [phone] (ICD-10-CM) - Impingement syndrome of left shoulder  THERAPY DIAG:  Acute pain of left shoulder  Stiffness of left shoulder, not elsewhere classified  Muscle weakness (generalized)  Cramp and spasm  Rationale for Evaluation and Treatment: Rehabilitation  ONSET DATE: 01/26/2023  SUBJECTIVE:                                                                                                                                                                                      SUBJECTIVE STATEMENT: Patient  reports he saw PA for f/u and she was pleased.  He admits he is having more pain since removing the sling.    Hand dominance: Ambidextrous  PERTINENT HISTORY: na  PAIN:  03/17/23: Are you having pain?  3/10   PRECAUTIONS: Other: no active motion and limit elbow extension past neutral: see protocol  WEIGHT BEARING RESTRICTIONS: Yes no weight through involved UE  FALLS:  Has patient fallen in last 6 months? No  LIVING ENVIRONMENT: Lives with: lives with their spouse Lives in: House/apartment  OCCUPATION: desk  PLOF: Independent, Independent with basic ADLs, Independent with household mobility without device, Independent with community mobility without device, Independent with homemaking with ambulation, Independent with gait, and Independent with transfers  PATIENT GOALS: He hopes to have full use of his arm and be able to resume his prior level of function.    NEXT MD VISIT: 6 weeks post op  OBJECTIVE:   DIAGNOSTIC FINDINGS:  na  PATIENT SURVEYS :  FOTO 40, goal is 85  COGNITION: Overall cognitive status: Within functional limits for tasks assessed     SENSATION: WFL  POSTURE: Rounded shoulders  UPPER EXTREMITY ROM:   Passive ROM Right eval Left eval  Shoulder flexion  90  Shoulder extension    Shoulder abduction  70  Shoulder adduction    Shoulder internal rotation  30  Shoulder external rotation  30  Elbow flexion  90  Elbow extension  90  (Blank rows = not tested)  UPPER EXTREMITY MMT:  Deferred on initial eval  MMT Right eval Left eval  Shoulder flexion    Shoulder extension    Shoulder abduction    Shoulder adduction    Shoulder internal rotation    Shoulder external rotation    Middle trapezius    Lower trapezius    Elbow flexion    Elbow extension    Wrist flexion    Wrist extension    Wrist ulnar deviation    Wrist radial deviation    Wrist pronation  Wrist supination    Grip strength (lbs)    (Blank rows = not  tested)    TODAY'S TREATMENT:                                                                                                                                         DATE:  03/17/23 Pendulums: 20 each of fwd/back, side/side, cw/ccw  Initiated AAROM with cane: printouts provided (biceps protected) Initiated pulleys x 2 min each : flexion, scaption and IR (biceps protected)  DATE:  03/11/23 Pendulums: 20 each of fwd/back, side/side, cw/ccw  PROM left shoulder : flexion, abduction, ER, IR; PROM pro/sup with elbow bent  DATE:  03/08/23 Pendulums: 20 each of fwd/back, side/side, cw/ccw  PROM left shoulder : flexion, abduction, ER, IR; PROM pro/sup with elbow bent   PATIENT EDUCATION: Education details: Pain control, HEP Person educated: Patient Education method: Explanation, Demonstration, and Verbal cues Education comprehension: verbalized understanding and returned demonstration  HOME EXERCISE PROGRAM: Access Code: [email or web address] URL: [email or web address] Date: 03/17/2023 Prepared by: Mikey Kirschner  Exercises - Supine Shoulder Flexion Extension AAROM with Dowel  - 1 x daily - 7 x weekly - 3 sets - 10 reps - Supine Shoulder Abduction AAROM with Dowel  - 1 x daily - 7 x weekly - 3 sets - 10 reps - Supine Shoulder External Rotation with Dowel  - 1 x daily - 7 x weekly - 3 sets - 10 reps - Standing Shoulder Internal Rotation Stretch with Dowel  - 1 x daily - 7 x weekly - 3 sets - 10 reps - Seated Shoulder Flexion AAROM with Pulley Behind  - 1 x daily - 7 x weekly - 1 sets - 1 reps - 2 min hold - Standing Shoulder Internal Rotation AAROM with Pulley  - 1 x daily - 7 x weekly - 3 sets - 10 reps - Seated Shoulder Scaption AAROM with Pulley at Side  - 1 x daily - 7 x weekly - 3 sets - 10 reps  ASSESSMENT:  CLINICAL IMPRESSION: Shia had some increased soreness from being out of his sling.  He continues to need reminders regarding precautions and protocol restrictions.  ROM  continues to improve with minimal pain.  He would  benefit from continuing skilled PT for RCR protocol.     OBJECTIVE IMPAIRMENTS: decreased mobility, decreased ROM, decreased strength, hypomobility, increased fascial restrictions, increased muscle spasms, impaired UE functional use, postural dysfunction, and pain.   ACTIVITY LIMITATIONS: carrying, lifting, transfers, bed mobility, bathing, toileting, dressing, reach over head, and hygiene/grooming  PARTICIPATION LIMITATIONS: meal prep, cleaning, laundry, driving, shopping, community activity, occupation, and yard work  PERSONAL FACTORS: Fitness, Past/current experiences, and 1-2 comorbidities: anxiety, depression, PTSD  are also affecting patient's functional outcome.   REHAB POTENTIAL: Good  CLINICAL DECISION MAKING: Stable/uncomplicated  EVALUATION COMPLEXITY: Low  GOALS: Goals reviewed with patient? Yes  SHORT TERM GOALS: Target date:  03/09/2023   Pain report to be no greater than 4/10  Baseline: Goal status: IN PROGRESS  2.  Patient will be independent with initial HEP  Baseline:  Goal status: MET 03/04/23  3.  ROM to improve to protocol limits Baseline:  Goal status: MET 02/23/23  LONG TERM GOALS: Target date: 04/06/2023    Patient to report pain no greater than 2/10  Baseline:  Goal status: MET 03/17/23  2.  Patient to be independent with advanced HEP  Baseline:  Goal status: INITIAL  3.  Patient to report 50% improvement in overall symptoms and use of left UE Baseline:  Goal status: INITIAL  4.  FOTO to be 67 Baseline: 41 Goal status: INITIAL  5.  ROM to be [facility] Baseline:  Goal status: INITIAL  6.  Strength to be 4 to 4+/5 all motions of left shoulder Baseline:  Goal status: INITIAL  PLAN: PT FREQUENCY:  3 times per week for first 3-4 weeks then 2 times per week.   Updated 03/04/23 patient is ahead of protocol, decreasing frequency to 1 time per week until patient is 6 weeks post op.    PT DURATION: 8  weeks  PLANNED INTERVENTIONS: Therapeutic exercises, Therapeutic activity, Neuromuscular re-education, Balance training, Gait training, Patient/Family education, Self Care, Joint mobilization, Aquatic Therapy, Dry Needling, Electrical stimulation, Cryotherapy, Moist heat, scar mobilization, Taping, Vasopneumatic device, Traction, Ultrasound, Ionotophoresis 4mg /ml Dexamethasone, Manual therapy, and Re-evaluation  PLAN FOR NEXT SESSION: Assess response to AAROM, PROM, pulleys, ice.      Victorino Dike B. Elantra Caprara, PT 03/18/23 7:32 AM  [facility] [address], [address] [place], [place] [address] Phone # [phone] Fax [phone]

## 2023-03-19 ENCOUNTER — Ambulatory Visit: Payer: Medicare Other

## 2023-03-19 DIAGNOSIS — M25612 Stiffness of left shoulder, not elsewhere classified: Secondary | ICD-10-CM

## 2023-03-19 DIAGNOSIS — R252 Cramp and spasm: Secondary | ICD-10-CM

## 2023-03-19 DIAGNOSIS — M6281 Muscle weakness (generalized): Secondary | ICD-10-CM

## 2023-03-19 DIAGNOSIS — M25512 Pain in left shoulder: Secondary | ICD-10-CM | POA: Diagnosis not present

## 2023-03-19 NOTE — Therapy (Signed)
OUTPATIENT PHYSICAL THERAPY UPPER EXTREMITY TREATMENT NOTE   Patient Name: Tom White MRN: [id number] DOB:[birth year], 62 y.o., male Today's Date: 03/19/2023  END OF SESSION:  PT End of Session - 03/19/23 1017     Visit Number 10    Number of Visits 16    Date for PT Re-Evaluation 04/06/23    Authorization Type [employer] MEDICARE    Progress Note Due on Visit 15    PT Start Time 1012    PT Stop Time 1101    PT Time Calculation (min) 49 min    Activity Tolerance Patient tolerated treatment well    Behavior During Therapy WFL for tasks assessed/performed              Past Medical History:  Diagnosis Date   Arthritis    "had it in my left ankle" (05/04/2017)   Environmental and seasonal allergies 06/07/2015   Fibrosis of subtalar joint, 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   Mild cognitive impairment of uncertain or unknown etiology 09/11/2021   Mixed hyperlipidemia 06/14/2016   Pain in left ankle and joints of left foot 06/17/2017   Pain in rectum    Pneumonia 2017   PTSD (post-traumatic stress disorder) 01/30/2020   Slow transit constipation 04/02/2021   Urinary incontinence    Past Surgical History:  Procedure Laterality Date   ANKLE ARTHROSCOPY Left ~ 2013   "scraped out arthritis"   ANKLE ARTHROSCOPY WITH FUSION Left 09/22/2017   Procedure: LEFT ANKLE POSTERIOR ARTHROSCOPIC SUBTALAR ARTHRODESIS;  Surgeon: Nadara Mustard, MD;  Location: [facility] OR;  Service: Orthopedics;  Laterality: Left;   ANKLE FRACTURE SURGERY Left 12/1980   APPLICATION OF WOUND VAC Right 05/03/2017    foot   APPLICATION OF WOUND VAC Right 05/03/2017   Procedure: APPLICATION OF WOUND VAC;  Surgeon: Eldred Manges, MD;  Location: [facility] OR;  Service: Orthopedics;  Laterality: Right;   FRACTURE SURGERY     I & D EXTREMITY Right 05/03/2017   foot   I & D EXTREMITY Right 05/03/2017   Procedure: IRRIGATION AND DEBRIDEMENT EXTREMITY RIGHT, REPAIR OF POSTERIOR TIBIAL TENDON;  Surgeon: Eldred Manges, MD;  Location: [facility] OR;  Service: Orthopedics;  Laterality: Right;   LAPAROSCOPIC CHOLECYSTECTOMY  1996?   POSTERIOR TIBIAL TENDON REPAIR Right 05/03/2017   SUBTALAR JOINT ARTHROEREISIS Left 1985?   TONSILLECTOMY     Patient Active Problem List   Diagnosis Date Noted   GERD (gastroesophageal reflux disease) 09/11/2021   Mild cognitive impairment of uncertain or unknown etiology 09/11/2021   Urinary incontinence    Pain in rectum    Major depressive disorder    History of concussion    Malignant melanoma of skin of back 04/16/2021   Slow transit constipation 04/02/2021   PTSD (post-traumatic stress disorder) 01/30/2020   Pain in left ankle and joints of left foot 06/17/2017   Fibrosis of subtalar joint, left 01/21/2017   Mixed hyperlipidemia 06/14/2016   Environmental and seasonal allergies 06/07/2015   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   Generalized anxiety  disorder 06/07/2015    PCP: Eartha Inch, MD   REFERRING PROVIDER: Francena Hanly, MD  REFERRING DIAG: [phone] (ICD-10-CM) - Impingement syndrome of left shoulder  THERAPY DIAG:  Acute pain of left shoulder  Stiffness of left shoulder, not elsewhere classified  Cramp and spasm  Muscle weakness (generalized)  Rationale for Evaluation and Treatment: Rehabilitation  ONSET DATE: 01/26/2023  SUBJECTIVE:                                                                                                                                                                                      SUBJECTIVE  STATEMENT: Patient reports "those exercises are brutal".  Pain level reported at 4/10   Hand dominance: Ambidextrous  PERTINENT HISTORY: na  PAIN:  03/19/23: Are you having pain?  4/10   PRECAUTIONS: Other: no active motion and limit elbow extension past neutral: see protocol  WEIGHT BEARING RESTRICTIONS: Yes no weight through involved UE  FALLS:  Has patient fallen in last 6 months? No  LIVING ENVIRONMENT: Lives with: lives with their spouse Lives in: House/apartment  OCCUPATION: desk  PLOF: Independent, Independent with basic ADLs, Independent with household mobility without device, Independent with community mobility without device, Independent with homemaking with ambulation, Independent with gait, and Independent with transfers  PATIENT GOALS: He hopes to have full use of his arm and be able to resume his prior level of function.    NEXT MD VISIT: 6 weeks post op  OBJECTIVE:   DIAGNOSTIC FINDINGS:  na  PATIENT SURVEYS :  FOTO 40, goal is 42  COGNITION: Overall cognitive status: Within functional limits for tasks assessed     SENSATION: WFL  POSTURE: Rounded shoulders  UPPER EXTREMITY ROM:   Passive ROM Right eval Left eval  Shoulder flexion  90  Shoulder extension    Shoulder abduction  70  Shoulder adduction    Shoulder internal rotation  30  Shoulder external rotation  30  Elbow flexion  90  Elbow extension  90  (Blank rows = not tested)  UPPER EXTREMITY MMT:  Deferred on initial eval  MMT Right eval Left eval  Shoulder flexion    Shoulder extension    Shoulder abduction    Shoulder adduction    Shoulder internal rotation    Shoulder external rotation    Middle trapezius    Lower trapezius    Elbow flexion    Elbow extension    Wrist flexion    Wrist extension    Wrist ulnar deviation    Wrist radial deviation    Wrist pronation    Wrist supination    Grip strength (lbs)    (  Blank rows = not tested)    TODAY'S TREATMENT:                                                                                                                                          DATE:  03/19/23 UBE x 5 min fwd only level 1 Finger ladder x 10 left Light blue physio ball on back counter ABC's left Pendulums: 20 each of fwd/back, side/side, cw/ccw  Fwd bent row with limited biceps activation x 20 0# Fwd bent shoulder extension limited range to protect the biceps x 20 0# Quadruped rocking x 1 min fwd/back and side to side (30 sec each) AAROM with cane: printouts provided (biceps protected) PROM left shoulder : flexion, abduction, ER, IR; PROM pro/sup with elbow bent Shoulder depression and retraction seated x 10 each Ice to left shoulder in supine hook lying x 10 min  03/17/23 Pendulums: 20 each of fwd/back, side/side, cw/ccw  Initiated AAROM with cane: printouts provided (biceps protected) Initiated pulleys x 2 min each : flexion, scaption and IR (biceps protected)  DATE:  03/11/23 Pendulums: 20 each of fwd/back, side/side, cw/ccw  PROM left shoulder : flexion, abduction, ER, IR; PROM pro/sup with elbow bent  DATE:  03/08/23 Pendulums: 20 each of fwd/back, side/side, cw/ccw  PROM left shoulder : flexion, abduction, ER, IR; PROM pro/sup with elbow bent   PATIENT EDUCATION: Education details: Pain control, HEP Person educated: Patient Education method: Explanation, Demonstration, and Verbal cues Education comprehension: verbalized understanding and returned demonstration  HOME EXERCISE PROGRAM: Access Code: [email or web address] URL: [email or web address] Date: 03/17/2023 Prepared by: Mikey Kirschner  Exercises - Supine Shoulder Flexion Extension AAROM with Dowel  - 1 x daily - 7 x weekly - 3 sets - 10 reps - Supine Shoulder Abduction AAROM with Dowel  - 1 x daily - 7 x weekly - 3 sets - 10 reps - Supine Shoulder External Rotation with Dowel  - 1 x daily - 7 x weekly - 3 sets - 10 reps - Standing Shoulder Internal  Rotation Stretch with Dowel  - 1 x daily - 7 x weekly - 3 sets - 10 reps - Seated Shoulder Flexion AAROM with Pulley Behind  - 1 x daily - 7 x weekly - 1 sets - 1 reps - 2 min hold - Standing Shoulder Internal Rotation AAROM with Pulley  - 1 x daily - 7 x weekly - 3 sets - 10 reps - Seated Shoulder Scaption AAROM with Pulley at Side  - 1 x daily - 7 x weekly - 3 sets - 10 reps  ASSESSMENT:  CLINICAL IMPRESSION: Griffon is progressing appropriately with minimal pain and soreness. He is most limited in IR but showing steady progress.  He is compliant and well motivated.  He should continue to do well.    He would  benefit from continuing skilled PT for RCR protocol.  OBJECTIVE IMPAIRMENTS: decreased mobility, decreased ROM, decreased strength, hypomobility, increased fascial restrictions, increased muscle spasms, impaired UE functional use, postural dysfunction, and pain.   ACTIVITY LIMITATIONS: carrying, lifting, transfers, bed mobility, bathing, toileting, dressing, reach over head, and hygiene/grooming  PARTICIPATION LIMITATIONS: meal prep, cleaning, laundry, driving, shopping, community activity, occupation, and yard work  PERSONAL FACTORS: Fitness, Past/current experiences, and 1-2 comorbidities: anxiety, depression, PTSD  are also affecting patient's functional outcome.   REHAB POTENTIAL: Good  CLINICAL DECISION MAKING: Stable/uncomplicated  EVALUATION COMPLEXITY: Low  GOALS: Goals reviewed with patient? Yes  SHORT TERM GOALS: Target date: 03/09/2023   Pain report to be no greater than 4/10  Baseline: Goal status: MET 03/19/23  2.  Patient will be independent with initial HEP  Baseline:  Goal status: MET 03/04/23  3.  ROM to improve to protocol limits Baseline:  Goal status: MET 02/23/23  LONG TERM GOALS: Target date: 04/06/2023    Patient to report pain no greater than 2/10  Baseline:  Goal status: MET 03/17/23  2.  Patient to be independent with advanced HEP   Baseline:  Goal status: INITIAL  3.  Patient to report 50% improvement in overall symptoms and use of left UE Baseline:  Goal status: INITIAL  4.  FOTO to be 67 Baseline: 41 Goal status: INITIAL  5.  ROM to be [facility] Baseline:  Goal status: INITIAL  6.  Strength to be 4 to 4+/5 all motions of left shoulder Baseline:  Goal status: INITIAL  PLAN: PT FREQUENCY:  3 times per week for first 3-4 weeks then 2 times per week.   Updated 03/04/23 patient is ahead of protocol, decreasing frequency to 1 time per week until patient is 6 weeks post op.    PT DURATION: 8 weeks  PLANNED INTERVENTIONS: Therapeutic exercises, Therapeutic activity, Neuromuscular re-education, Balance training, Gait training, Patient/Family education, Self Care, Joint mobilization, Aquatic Therapy, Dry Needling, Electrical stimulation, Cryotherapy, Moist heat, scar mobilization, Taping, Vasopneumatic device, Traction, Ultrasound, Ionotophoresis 4mg /ml Dexamethasone, Manual therapy, and Re-evaluation  PLAN FOR NEXT SESSION: Continue protocol, PROM, pulleys, ice.      Simonne Boulos B. Daphyne Miguez, PT 03/19/23 11:06 AM  [facility] [address], [address] [place], [place] [address] Phone # [phone] Fax [phone]

## 2023-03-23 ENCOUNTER — Ambulatory Visit: Payer: Medicare Other

## 2023-03-23 DIAGNOSIS — R252 Cramp and spasm: Secondary | ICD-10-CM

## 2023-03-23 DIAGNOSIS — M6281 Muscle weakness (generalized): Secondary | ICD-10-CM

## 2023-03-23 DIAGNOSIS — M25512 Pain in left shoulder: Secondary | ICD-10-CM | POA: Diagnosis not present

## 2023-03-23 DIAGNOSIS — M25612 Stiffness of left shoulder, not elsewhere classified: Secondary | ICD-10-CM

## 2023-03-23 DIAGNOSIS — R293 Abnormal posture: Secondary | ICD-10-CM

## 2023-03-23 NOTE — Therapy (Signed)
OUTPATIENT PHYSICAL THERAPY UPPER EXTREMITY TREATMENT NOTE   Patient Name: Tom White MRN: [id number] DOB:Jul 10, 1961, 62 y.o., male Today's Date: 03/23/2023  END OF SESSION:  PT End of Session - 03/23/23 1155     Visit Number 11    Date for PT Re-Evaluation 04/06/23    Authorization Type [employer] MEDICARE    Progress Note Due on Visit 15    PT Start Time 1154    PT Stop Time 1230    PT Time Calculation (min) 36 min    Activity Tolerance Patient tolerated treatment well    Behavior During Therapy WFL for tasks assessed/performed              Past Medical History:  Diagnosis Date   Arthritis    "had it in my left ankle" (05/04/2017)   Environmental and seasonal allergies 06/07/2015   Fibrosis of subtalar joint, 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   Mild cognitive impairment of uncertain or unknown etiology 09/11/2021   Mixed hyperlipidemia 06/14/2016   Pain in left ankle and joints of left foot 06/17/2017   Pain in rectum    Pneumonia 2017   PTSD (post-traumatic stress disorder) 01/30/2020   Slow transit constipation 04/02/2021   Urinary incontinence    Past Surgical History:  Procedure Laterality Date   ANKLE ARTHROSCOPY Left ~ 2013   "scraped out arthritis"   ANKLE ARTHROSCOPY WITH FUSION Left 09/22/2017   Procedure: LEFT ANKLE POSTERIOR ARTHROSCOPIC SUBTALAR ARTHRODESIS;  Surgeon: Nadara Mustard, MD;  Location: [facility] OR;  Service: Orthopedics;  Laterality: Left;   ANKLE FRACTURE SURGERY Left 12/1980   APPLICATION OF WOUND VAC Right 05/03/2017   foot   APPLICATION OF  WOUND VAC Right 05/03/2017   Procedure: APPLICATION OF WOUND VAC;  Surgeon: Eldred Manges, MD;  Location: [facility] OR;  Service: Orthopedics;  Laterality: Right;   FRACTURE SURGERY     I & D EXTREMITY Right 05/03/2017   foot   I & D EXTREMITY Right 05/03/2017   Procedure: IRRIGATION AND DEBRIDEMENT EXTREMITY RIGHT, REPAIR OF POSTERIOR TIBIAL TENDON;  Surgeon: Eldred Manges, MD;  Location: [facility] OR;  Service: Orthopedics;  Laterality: Right;   LAPAROSCOPIC CHOLECYSTECTOMY  1996?   POSTERIOR TIBIAL TENDON REPAIR Right 05/03/2017   SUBTALAR JOINT ARTHROEREISIS Left 1985?   TONSILLECTOMY     Patient Active Problem List   Diagnosis Date Noted   GERD (gastroesophageal reflux disease) 09/11/2021   Mild cognitive impairment of uncertain or unknown etiology 09/11/2021   Urinary incontinence    Pain in rectum    Major depressive disorder    History of concussion    Malignant melanoma of skin of back 04/16/2021   Slow transit constipation 04/02/2021   PTSD (post-traumatic stress disorder) 01/30/2020   Pain in left ankle and joints of left foot 06/17/2017   Fibrosis of subtalar joint, left 01/21/2017   Mixed hyperlipidemia 06/14/2016   Environmental and seasonal allergies 06/07/2015   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   Generalized anxiety disorder 06/07/2015    PCP: Cyndia Bent,  Kayleen Memos, MD   REFERRING PROVIDER: Francena Hanly, MD  REFERRING DIAG: [phone] (ICD-10-CM) - Impingement syndrome of left shoulder  THERAPY DIAG:  Acute pain of left shoulder  Stiffness of left shoulder, not elsewhere classified  Cramp and spasm  Muscle weakness (generalized)  Abnormal posture  Rationale for Evaluation and Treatment: Rehabilitation  ONSET DATE: 01/26/2023  SUBJECTIVE:                                                                                                                                                                                      SUBJECTIVE STATEMENT: 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  Pain level reported at 5/10   Hand dominance: Ambidextrous  PERTINENT HISTORY: na  PAIN:  03/23/23: Are you having pain?  5/10   PRECAUTIONS: Other: no active motion and limit elbow extension past neutral: see protocol  WEIGHT BEARING RESTRICTIONS: Yes no weight through involved UE  FALLS:  Has patient fallen in last 6 months? No  LIVING ENVIRONMENT: Lives with: lives with their spouse Lives in: House/apartment  OCCUPATION: desk  PLOF: Independent, Independent with basic ADLs, Independent with household mobility without device, Independent with community mobility without device, Independent with homemaking with ambulation, Independent with gait, and Independent with transfers  PATIENT GOALS: He hopes to have full use of his arm and be able to resume his prior level of function.    NEXT MD VISIT: 6 weeks post op  OBJECTIVE:   DIAGNOSTIC FINDINGS:  na  PATIENT SURVEYS :  FOTO 40, goal is 37  COGNITION: Overall cognitive status: Within functional limits for tasks assessed     SENSATION: WFL  POSTURE: Rounded shoulders  UPPER EXTREMITY ROM:   Passive ROM Right eval Left eval  Shoulder flexion  90  Shoulder extension    Shoulder abduction  70  Shoulder adduction    Shoulder internal rotation  30  Shoulder external rotation  30  Elbow flexion  90  Elbow extension  90  (Blank rows = not tested)  UPPER EXTREMITY MMT:  Deferred on initial eval  MMT Right eval Left eval  Shoulder flexion    Shoulder extension    Shoulder abduction    Shoulder adduction    Shoulder internal rotation    Shoulder external rotation    Middle trapezius    Lower trapezius    Elbow flexion    Elbow extension    Wrist flexion    Wrist extension    Wrist ulnar deviation    Wrist radial deviation  Wrist pronation    Wrist  supination    Grip strength (lbs)    (Blank rows = not tested)    TODAY'S TREATMENT:                                                                                                                                         DATE:  03/23/23 UBE x 11 min fwd only level 1 Light blue physio ball on back counter ABC's left Fwd bent row with limited biceps activation x 20 1# Fwd bent shoulder extension limited range to protect the biceps x 20 1# Pulleys x 2 min each : flexion, scaption and IR  Supine shoulder stabilization at 90 degrees flexion with perturbations  PROM left shoulder : flexion, abduction, ER, IR; PROM pro/sup with elbow bent Shoulder depression and retraction seated x 10 each Ice to left shoulder in supine hook lying x 10 min  DATE:  03/19/23 UBE x 5 min fwd only level 1 Finger ladder x 10 left Light blue physio ball on back counter ABC's left Pendulums: 20 each of fwd/back, side/side, cw/ccw  Fwd bent row with limited biceps activation x 20 0# Fwd bent shoulder extension limited range to protect the biceps x 20 0# Quadruped rocking x 1 min fwd/back and side to side (30 sec each) AAROM with cane: printouts provided (biceps protected) PROM left shoulder : flexion, abduction, ER, IR; PROM pro/sup with elbow bent Shoulder depression and retraction seated x 10 each Ice to left shoulder in supine hook lying x 10 min  03/17/23 Pendulums: 20 each of fwd/back, side/side, cw/ccw  Initiated AAROM with cane: printouts provided (biceps protected) Initiated pulleys x 2 min each : flexion, scaption and IR (biceps protected)  PATIENT EDUCATION: Education details: Pain control, HEP Person educated: Patient Education method: Explanation, Demonstration, and Verbal cues Education comprehension: verbalized understanding and returned demonstration  HOME EXERCISE PROGRAM: Access Code: [email or web address] URL: [email or web address] Date: 03/17/2023 Prepared by: Mikey Kirschner  Exercises - Supine Shoulder Flexion Extension AAROM with Dowel  - 1 x daily - 7 x weekly - 3 sets - 10 reps - Supine Shoulder Abduction AAROM with Dowel  - 1 x daily - 7 x weekly - 3 sets - 10 reps - Supine Shoulder External Rotation with Dowel  - 1 x daily - 7 x weekly - 3 sets - 10 reps - Standing Shoulder Internal Rotation Stretch with Dowel  - 1 x daily - 7 x weekly - 3 sets - 10 reps - Seated Shoulder Flexion AAROM with Pulley Behind  - 1 x daily - 7 x weekly - 1 sets - 1 reps - 2 min hold - Standing Shoulder Internal Rotation AAROM with Pulley  - 1 x daily - 7 x weekly - 3 sets - 10 reps - Seated Shoulder Scaption AAROM with Pulley at Side  - 1 x daily - 7 x weekly -  3 sets - 10 reps  ASSESSMENT:  CLINICAL IMPRESSION: Tom White is progressing appropriately.   He would  benefit from continuing skilled PT for RCR protocol.     OBJECTIVE IMPAIRMENTS: decreased mobility, decreased ROM, decreased strength, hypomobility, increased fascial restrictions, increased muscle spasms, impaired UE functional use, postural dysfunction, and pain.   ACTIVITY LIMITATIONS: carrying, lifting, transfers, bed mobility, bathing, toileting, dressing, reach over head, and hygiene/grooming  PARTICIPATION LIMITATIONS: meal prep, cleaning, laundry, driving, shopping, community activity, occupation, and yard work  PERSONAL FACTORS: Fitness, Past/current experiences, and 1-2 comorbidities: anxiety, depression, PTSD  are also affecting patient's functional outcome.   REHAB POTENTIAL: Good  CLINICAL DECISION MAKING: Stable/uncomplicated  EVALUATION COMPLEXITY: Low  GOALS: Goals reviewed with patient? Yes  SHORT TERM GOALS: Target date: 03/09/2023   Pain report to be no greater than 4/10  Baseline: Goal status: MET 03/19/23  2.  Patient will be independent with initial HEP  Baseline:  Goal status: MET 03/04/23  3.  ROM to improve to protocol limits Baseline:  Goal status: MET 02/23/23  LONG TERM  GOALS: Target date: 04/06/2023    Patient to report pain no greater than 2/10  Baseline:  Goal status: MET 03/17/23  2.  Patient to be independent with advanced HEP  Baseline:  Goal status: INITIAL  3.  Patient to report 50% improvement in overall symptoms and use of left UE Baseline:  Goal status: INITIAL  4.  FOTO to be 67 Baseline: 41 Goal status: INITIAL  5.  ROM to be [facility] Baseline:  Goal status: INITIAL  6.  Strength to be 4 to 4+/5 all motions of left shoulder Baseline:  Goal status: INITIAL  PLAN: PT FREQUENCY:  3 times per week for first 3-4 weeks then 2 times per week.   Updated 03/04/23 patient is ahead of protocol, decreasing frequency to 1 time per week until patient is 6 weeks post op.    PT DURATION: 8 weeks  PLANNED INTERVENTIONS: Therapeutic exercises, Therapeutic activity, Neuromuscular re-education, Balance training, Gait training, Patient/Family education, Self Care, Joint mobilization, Aquatic Therapy, Dry Needling, Electrical stimulation, Cryotherapy, Moist heat, scar mobilization, Taping, Vasopneumatic device, Traction, Ultrasound, Ionotophoresis 4mg /ml Dexamethasone, Manual therapy, and Re-evaluation  PLAN FOR NEXT SESSION: Continue protocol, PROM, pulleys, ice.      Tom White, PT 03/23/23 5:28 PM  [facility] [address] [place], [place] [address] Phone # [phone] Fax [phone]

## 2023-03-25 ENCOUNTER — Ambulatory Visit: Payer: Medicare Other

## 2023-03-25 DIAGNOSIS — M25612 Stiffness of left shoulder, not elsewhere classified: Secondary | ICD-10-CM

## 2023-03-25 DIAGNOSIS — M25512 Pain in left shoulder: Secondary | ICD-10-CM

## 2023-03-25 DIAGNOSIS — R252 Cramp and spasm: Secondary | ICD-10-CM

## 2023-03-25 DIAGNOSIS — M6281 Muscle weakness (generalized): Secondary | ICD-10-CM

## 2023-03-25 DIAGNOSIS — R293 Abnormal posture: Secondary | ICD-10-CM

## 2023-03-25 NOTE — Therapy (Signed)
OUTPATIENT PHYSICAL THERAPY UPPER EXTREMITY TREATMENT NOTE   Patient Name: Tom White MRN: [id number] DOB:1961-01-06, 62 y.o., male Today's Date: 03/25/2023  END OF SESSION:  PT End of Session - 03/25/23 1154     Visit Number 12    Number of Visits 16    Date for PT Re-Evaluation 04/06/23    Authorization Type [employer] MEDICARE    PT Start Time 1145    PT Stop Time 1230    PT Time Calculation (min) 45 min    Activity Tolerance Patient tolerated treatment well    Behavior During Therapy WFL for tasks assessed/performed              Past Medical History:  Diagnosis Date   Arthritis    "had it in my left ankle" (05/04/2017)   Environmental and seasonal allergies 06/07/2015   Fibrosis of subtalar joint, 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   Mild cognitive impairment of uncertain or unknown etiology 09/11/2021   Mixed hyperlipidemia 06/14/2016   Pain in left ankle and joints of left foot 06/17/2017   Pain in rectum    Pneumonia 2017   PTSD (post-traumatic stress disorder) 01/30/2020   Slow transit constipation 04/02/2021   Urinary incontinence    Past Surgical History:  Procedure Laterality Date   ANKLE ARTHROSCOPY Left ~ 2013   "scraped out arthritis"   ANKLE ARTHROSCOPY WITH FUSION Left 09/22/2017   Procedure: LEFT ANKLE POSTERIOR ARTHROSCOPIC SUBTALAR ARTHRODESIS;  Surgeon: Nadara Mustard, MD;  Location: [facility] OR;  Service: Orthopedics;  Laterality: Left;   ANKLE FRACTURE SURGERY Left 12/1980   APPLICATION OF WOUND VAC Right 05/03/2017   foot   APPLICATION OF WOUND VAC  Right 05/03/2017   Procedure: APPLICATION OF WOUND VAC;  Surgeon: Eldred Manges, MD;  Location: [facility] OR;  Service: Orthopedics;  Laterality: Right;   FRACTURE SURGERY     I & D EXTREMITY Right 05/03/2017   foot   I & D EXTREMITY Right 05/03/2017   Procedure: IRRIGATION AND DEBRIDEMENT EXTREMITY RIGHT, REPAIR OF POSTERIOR TIBIAL TENDON;  Surgeon: Eldred Manges, MD;  Location: [facility] OR;  Service: Orthopedics;  Laterality: Right;   LAPAROSCOPIC CHOLECYSTECTOMY  1996?   POSTERIOR TIBIAL TENDON REPAIR Right 05/03/2017   SUBTALAR JOINT ARTHROEREISIS Left 1985?   TONSILLECTOMY     Patient Active Problem List   Diagnosis Date Noted   GERD (gastroesophageal reflux disease) 09/11/2021   Mild cognitive impairment of uncertain or unknown etiology 09/11/2021   Urinary incontinence    Pain in rectum    Major depressive disorder    History of concussion    Malignant melanoma of skin of back 04/16/2021   Slow transit constipation 04/02/2021   PTSD (post-traumatic stress disorder) 01/30/2020   Pain in left ankle and joints of left foot 06/17/2017   Fibrosis of subtalar joint, left 01/21/2017   Mixed hyperlipidemia 06/14/2016   Environmental and seasonal allergies 06/07/2015   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   Generalized anxiety disorder 06/07/2015    PCP: Eartha Inch,  MD   REFERRING PROVIDER: Francena Hanly, MD  REFERRING DIAG: [phone] (ICD-10-CM) - Impingement syndrome of left shoulder  THERAPY DIAG:  Acute pain of left shoulder  Stiffness of left shoulder, not elsewhere classified  Cramp and spasm  Muscle weakness (generalized)  Abnormal posture  Rationale for Evaluation and Treatment: Rehabilitation  ONSET DATE: 01/26/2023  SUBJECTIVE:                                                                                                                                                                                      SUBJECTIVE STATEMENT: Patient reports he is  a little sore at the biceps area.  He will be out of town next week and will resume PT once he returns.    Hand dominance: Ambidextrous  PERTINENT HISTORY: na  PAIN:  03/23/23: Are you having pain?  5/10   PRECAUTIONS: Other: no active motion and limit elbow extension past neutral: see protocol  WEIGHT BEARING RESTRICTIONS: Yes no weight through involved UE  FALLS:  Has patient fallen in last 6 months? No  LIVING ENVIRONMENT: Lives with: lives with their spouse Lives in: House/apartment  OCCUPATION: desk  PLOF: Independent, Independent with basic ADLs, Independent with household mobility without device, Independent with community mobility without device, Independent with homemaking with ambulation, Independent with gait, and Independent with transfers  PATIENT GOALS: He hopes to have full use of his arm and be able to resume his prior level of function.    NEXT MD VISIT: 6 weeks post op  OBJECTIVE:   DIAGNOSTIC FINDINGS:  na  PATIENT SURVEYS :  FOTO 40, goal is 68  COGNITION: Overall cognitive status: Within functional limits for tasks assessed     SENSATION: WFL  POSTURE: Rounded shoulders  UPPER EXTREMITY ROM:   Passive ROM Right eval Left eval  Shoulder flexion  90  Shoulder extension    Shoulder abduction  70  Shoulder adduction    Shoulder internal rotation  30  Shoulder external rotation  30  Elbow flexion  90  Elbow extension  90  (Blank rows = not tested)  UPPER EXTREMITY MMT:  Deferred on initial eval  MMT Right eval Left eval  Shoulder flexion    Shoulder extension    Shoulder abduction    Shoulder adduction    Shoulder internal rotation    Shoulder external rotation    Middle trapezius    Lower trapezius    Elbow flexion    Elbow extension    Wrist flexion    Wrist extension    Wrist ulnar deviation    Wrist radial deviation    Wrist pronation    Wrist  supination    Grip strength (lbs)    (Blank rows = not  tested)    TODAY'S TREATMENT:                                                                                                                                         DATE:  03/25/23 UBE x 6 min fwd only level 1 Light blue physio ball on back counter 4D ball rolls x 20 each Fwd bent row with limited biceps activation x 20 1# Fwd bent shoulder extension limited range to protect the biceps x 20 1# Pulleys x 2 min each : flexion, scaption and IR  Supine shoulder stabilization at 90 degrees flexion with perturbations  PROM left shoulder : flexion, abduction, ER, IR; PROM pro/sup with elbow bent Ice to left shoulder in supine hook lying x 10 min  03/23/23 UBE x 11 min fwd only level 1 Light blue physio ball on back counter ABC's left Fwd bent row with limited biceps activation x 20 1# Fwd bent shoulder extension limited range to protect the biceps x 20 1# Pulleys x 2 min each : flexion, scaption and IR  Supine shoulder stabilization at 90 degrees flexion with perturbations  PROM left shoulder : flexion, abduction, ER, IR; PROM pro/sup with elbow bent Shoulder depression and retraction seated x 10 each Ice to left shoulder in supine hook lying x 10 min  DATE:  03/19/23 UBE x 5 min fwd only level 1 Finger ladder x 10 left Light blue physio ball on back counter ABC's left Pendulums: 20 each of fwd/back, side/side, cw/ccw  Fwd bent row with limited biceps activation x 20 0# Fwd bent shoulder extension limited range to protect the biceps x 20 0# Quadruped rocking x 1 min fwd/back and side to side (30 sec each) AAROM with cane: printouts provided (biceps protected) PROM left shoulder : flexion, abduction, ER, IR; PROM pro/sup with elbow bent Shoulder depression and retraction seated x 10 each Ice to left shoulder in supine hook lying x 10 min  03/17/23 Pendulums: 20 each of fwd/back, side/side, cw/ccw  Initiated AAROM with cane: printouts provided (biceps protected) Initiated pulleys x 2  min each : flexion, scaption and IR (biceps protected)  PATIENT EDUCATION: Education details: Pain control, HEP Person educated: Patient Education method: Explanation, Demonstration, and Verbal cues Education comprehension: verbalized understanding and returned demonstration  HOME EXERCISE PROGRAM: Access Code: [email or web address] URL: [email or web address] Date: 03/17/2023 Prepared by: Mikey Kirschner  Exercises - Supine Shoulder Flexion Extension AAROM with Dowel  - 1 x daily - 7 x weekly - 3 sets - 10 reps - Supine Shoulder Abduction AAROM with Dowel  - 1 x daily - 7 x weekly - 3 sets - 10 reps - Supine Shoulder External Rotation with Dowel  - 1 x daily - 7 x weekly - 3 sets - 10 reps - Standing Shoulder Internal Rotation Stretch  with Dowel  - 1 x daily - 7 x weekly - 3 sets - 10 reps - Seated Shoulder Flexion AAROM with Pulley Behind  - 1 x daily - 7 x weekly - 1 sets - 1 reps - 2 min hold - Standing Shoulder Internal Rotation AAROM with Pulley  - 1 x daily - 7 x weekly - 3 sets - 10 reps - Seated Shoulder Scaption AAROM with Pulley at Side  - 1 x daily - 7 x weekly - 3 sets - 10 reps  ASSESSMENT:  CLINICAL IMPRESSION: Rainier is progressing very well with ROM.  He is beginning to have some discomfort at the proximal bicep area.  We discussed need to continue protecting the biceps and reviewed appropriate activities while he is out of town at [employer].   He would  benefit from continuing skilled PT for RCR protocol.     OBJECTIVE IMPAIRMENTS: decreased mobility, decreased ROM, decreased strength, hypomobility, increased fascial restrictions, increased muscle spasms, impaired UE functional use, postural dysfunction, and pain.   ACTIVITY LIMITATIONS: carrying, lifting, transfers, bed mobility, bathing, toileting, dressing, reach over head, and hygiene/grooming  PARTICIPATION LIMITATIONS: meal prep, cleaning, laundry, driving, shopping, community activity, occupation, and yard  work  PERSONAL FACTORS: Fitness, Past/current experiences, and 1-2 comorbidities: anxiety, depression, PTSD  are also affecting patient's functional outcome.   REHAB POTENTIAL: Good  CLINICAL DECISION MAKING: Stable/uncomplicated  EVALUATION COMPLEXITY: Low  GOALS: Goals reviewed with patient? Yes  SHORT TERM GOALS: Target date: 03/09/2023   Pain report to be no greater than 4/10  Baseline: Goal status: MET 03/19/23  2.  Patient will be independent with initial HEP  Baseline:  Goal status: MET 03/04/23  3.  ROM to improve to protocol limits Baseline:  Goal status: MET 02/23/23  LONG TERM GOALS: Target date: 04/06/2023    Patient to report pain no greater than 2/10  Baseline:  Goal status: MET 03/17/23  2.  Patient to be independent with advanced HEP  Baseline:  Goal status: INITIAL  3.  Patient to report 50% improvement in overall symptoms and use of left UE Baseline:  Goal status: INITIAL  4.  FOTO to be 67 Baseline: 41 Goal status: INITIAL  5.  ROM to be [facility] Baseline:  Goal status: INITIAL  6.  Strength to be 4 to 4+/5 all motions of left shoulder Baseline:  Goal status: INITIAL  PLAN: PT FREQUENCY:  3 times per week for first 3-4 weeks then 2 times per week.   Updated 03/04/23 patient is ahead of protocol, decreasing frequency to 1 time per week until patient is 6 weeks post op.    PT DURATION: 8 weeks  PLANNED INTERVENTIONS: Therapeutic exercises, Therapeutic activity, Neuromuscular re-education, Balance training, Gait training, Patient/Family education, Self Care, Joint mobilization, Aquatic Therapy, Dry Needling, Electrical stimulation, Cryotherapy, Moist heat, scar mobilization, Taping, Vasopneumatic device, Traction, Ultrasound, Ionotophoresis 4mg /ml Dexamethasone, Manual therapy, and Re-evaluation  PLAN FOR NEXT SESSION: Continue protocol, PROM, pulleys, ice.      Markeita Alicia B. Egon Dittus, PT 03/25/23 12:34 PM [facility] [address] [place], [place] [address] Phone # [phone] Fax [phone]

## 2023-03-30 ENCOUNTER — Encounter: Payer: Medicare Other | Admitting: Physical Therapy

## 2023-04-01 ENCOUNTER — Encounter: Payer: Medicare Other | Admitting: Physical Therapy

## 2023-04-20 ENCOUNTER — Ambulatory Visit: Payer: Medicare Other | Attending: Orthopedic Surgery

## 2023-04-20 DIAGNOSIS — M25512 Pain in left shoulder: Secondary | ICD-10-CM | POA: Diagnosis present

## 2023-04-20 DIAGNOSIS — R252 Cramp and spasm: Secondary | ICD-10-CM | POA: Insufficient documentation

## 2023-04-20 DIAGNOSIS — M25612 Stiffness of left shoulder, not elsewhere classified: Secondary | ICD-10-CM | POA: Diagnosis present

## 2023-04-20 DIAGNOSIS — M25511 Pain in right shoulder: Secondary | ICD-10-CM | POA: Diagnosis present

## 2023-04-20 DIAGNOSIS — M6281 Muscle weakness (generalized): Secondary | ICD-10-CM | POA: Diagnosis present

## 2023-04-20 DIAGNOSIS — R293 Abnormal posture: Secondary | ICD-10-CM | POA: Insufficient documentation

## 2023-04-20 NOTE — Therapy (Signed)
OUTPATIENT PHYSICAL THERAPY UPPER EXTREMITY TREATMENT NOTE   Patient Name: Tom White MRN: [id number] DOB:[birth year], 62 y.o., male Today's Date: 04/20/2023  END OF SESSION:  PT End of Session - 04/20/23 1111     Visit Number 13    Date for PT Re-Evaluation 06/01/23    Authorization Type [employer] MEDICARE    PT Start Time 1100    PT Stop Time 1148    PT Time Calculation (min) 48 min    Activity Tolerance Patient tolerated treatment well    Behavior During Therapy WFL for tasks assessed/performed              Past Medical History:  Diagnosis Date   Arthritis    "had it in my left ankle" (05/04/2017)   Environmental and seasonal allergies 06/07/2015   Fibrosis of subtalar joint, 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   Mild cognitive impairment of uncertain or unknown etiology 09/11/2021   Mixed hyperlipidemia 06/14/2016   Pain in left ankle and joints of left foot 06/17/2017   Pain in rectum    Pneumonia 2017   PTSD (post-traumatic stress disorder) 01/30/2020   Slow transit constipation 04/02/2021   Urinary incontinence    Past Surgical History:  Procedure Laterality Date   ANKLE ARTHROSCOPY Left ~ 2013   "scraped out arthritis"   ANKLE ARTHROSCOPY WITH FUSION Left 09/22/2017   Procedure: LEFT ANKLE POSTERIOR ARTHROSCOPIC SUBTALAR ARTHRODESIS;  Surgeon: Nadara Mustard, MD;  Location: [facility] OR;  Service: Orthopedics;  Laterality: Left;   ANKLE FRACTURE SURGERY Left 12/1980   APPLICATION OF WOUND VAC Right 05/03/2017   foot   APPLICATION OF WOUND VAC Right 05/03/2017    Procedure: APPLICATION OF WOUND VAC;  Surgeon: Eldred Manges, MD;  Location: [facility] OR;  Service: Orthopedics;  Laterality: Right;   FRACTURE SURGERY     I & D EXTREMITY Right 05/03/2017   foot   I & D EXTREMITY Right 05/03/2017   Procedure: IRRIGATION AND DEBRIDEMENT EXTREMITY RIGHT, REPAIR OF POSTERIOR TIBIAL TENDON;  Surgeon: Eldred Manges, MD;  Location: [facility] OR;  Service: Orthopedics;  Laterality: Right;   LAPAROSCOPIC CHOLECYSTECTOMY  1996?   POSTERIOR TIBIAL TENDON REPAIR Right 05/03/2017   SUBTALAR JOINT ARTHROEREISIS Left 1985?   TONSILLECTOMY     Patient Active Problem List   Diagnosis Date Noted   GERD (gastroesophageal reflux disease) 09/11/2021   Mild cognitive impairment of uncertain or unknown etiology 09/11/2021   Urinary incontinence    Pain in rectum    Major depressive disorder    History of concussion    Malignant melanoma of skin of back 04/16/2021   Slow transit constipation 04/02/2021   PTSD (post-traumatic stress disorder) 01/30/2020   Pain in left ankle and joints of left foot 06/17/2017   Fibrosis of subtalar joint, left 01/21/2017   Mixed hyperlipidemia 06/14/2016   Environmental and seasonal allergies 06/07/2015   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   Generalized anxiety disorder 06/07/2015    PCP: Eartha Inch, MD   REFERRING PROVIDER: Francena Hanly,  MD  REFERRING DIAG: M75.42 (ICD-10-CM) - Impingement syndrome of left shoulder  THERAPY DIAG:  Acute pain of left shoulder  Stiffness of left shoulder, not elsewhere classified  Acute pain of right shoulder  Muscle weakness (generalized)  Cramp and spasm  Rationale for Evaluation and Treatment: Rehabilitation  ONSET DATE: 01/26/2023  SUBJECTIVE:                                                                                                                                                                                      SUBJECTIVE STATEMENT: Patient arrives from being on  vacation for past 3 weeks.  He is now 11 weeks post op for the left shoulder.   He states left shoulder is doing well.  Right shoulder had become quite painful.  He saw Dr. Rennis Chris for f/u on left shoulder and received cortisone in right.   He states right shoulder feels a lot better.  He also has new referral for [place] to evaluate the right shoulder.      Hand dominance: Ambidextrous  PERTINENT HISTORY: na  PAIN:  04/20/23: Are you having pain?  Left 2-3 at rest, with movement 5-6/10: right shoulder 4-5/10 at the distal biceps and impingement zone area   PRECAUTIONS: Other: no active motion and limit elbow extension past neutral: see protocol  WEIGHT BEARING RESTRICTIONS: No as of 04/20/23  FALLS:  Has patient fallen in last 6 months? No  LIVING ENVIRONMENT: Lives with: lives with their spouse Lives in: House/apartment  OCCUPATION: desk  PLOF: Independent, Independent with basic ADLs, Independent with household mobility without device, Independent with community mobility without device, Independent with homemaking with ambulation, Independent with gait, and Independent with transfers  PATIENT GOALS: He hopes to have full use of his arm and be able to resume his prior level of function.  For right shoulder, he would like to resolve pain and avoid surgery on right shoulder.  NEXT MD VISIT: 16 weeks post op  OBJECTIVE:   DIAGNOSTIC FINDINGS:  na  PATIENT SURVEYS :  Initial eval: FOTO 40, goal is 49  04/20/23 FOTO : 49, goal is 66  Special Tests:  Pos speeds test on right 04/20/23  COGNITION: Overall cognitive status: Within functional limits for tasks assessed     SENSATION: WFL  POSTURE: Rounded shoulders  UPPER EXTREMITY ROM:   Passive ROM Right eval Left eval Right 04/20/23 Left 04/20/23  Shoulder flexion  90 155 167  Shoulder extension      Shoulder abduction  70 148 148  Shoulder adduction      Shoulder internal rotation  30 60 65  Shoulder external rotation   30 WNL  WNL  Elbow flexion  90    Elbow extension  90    (Blank rows = not tested)  UPPER EXTREMITY MMT:  Deferred on initial eval  MMT Right eval Right  04/20/23 Left eval Left 04/20/23  Shoulder flexion  4    Shoulder extension      Shoulder abduction  4    Shoulder adduction      Shoulder internal rotation  4    Shoulder external rotation  4-    Middle trapezius      Lower trapezius      Elbow flexion      Elbow extension      Wrist flexion      Wrist extension      Wrist ulnar deviation      Wrist radial deviation      Wrist pronation      Wrist supination      Grip strength (lbs)      (Blank rows = not tested)    TODAY'S TREATMENT:                                                                                                                                         DATE:  04/20/23 UBE x 6 min fwd only level 1 3 way scapular stabilization with green loop x 10 each UE Light blue physio ball 4D ball rolls x 20 each UE Prone extension, row and horizontal abduction x 20 each with 1 # Side lying ER x 20 with 1#  each UE Supine serratus punch with 1# x 20 bilaterally PROM left shoulder : flexion, abduction, ER, IR; PROM pro/sup with elbow bent Reviewed current protocol restrictions and precautions  03/25/23 UBE x 6 min fwd only level 1 Light blue physio ball on back counter 4D ball rolls x 20 each Fwd bent row with limited biceps activation x 20 1# Fwd bent shoulder extension limited range to protect the biceps x 20 1# Pulleys x 2 min each : flexion, scaption and IR  Supine shoulder stabilization at 90 degrees flexion with perturbations  PROM left shoulder : flexion, abduction, ER, IR; PROM pro/sup with elbow bent Ice to left shoulder in supine hook lying x 10 min  03/23/23 UBE x 11 min fwd only level 1 Light blue physio ball on back counter ABC's left Fwd bent row with limited biceps activation x 20 1# Fwd bent shoulder extension limited range to protect the  biceps x 20 1# Pulleys x 2 min each : flexion, scaption and IR  Supine shoulder stabilization at 90 degrees flexion with perturbations  PROM left shoulder : flexion, abduction, ER, IR; PROM pro/sup with elbow bent Shoulder depression and retraction seated x 10 each Ice to left shoulder in supine hook lying x 10 min   PATIENT EDUCATION: Education details: Pain control, HEP Person educated: Patient Education method: Programmer, multimedia, Demonstration, and Verbal cues  Education comprehension: verbalized understanding and returned demonstration  HOME EXERCISE PROGRAM: Access Code: [email or web address] URL: [email or web address] Date: 03/17/2023 Prepared by: Mikey Kirschner  Exercises - Supine Shoulder Flexion Extension AAROM with Dowel  - 1 x daily - 7 x weekly - 3 sets - 10 reps - Supine Shoulder Abduction AAROM with Dowel  - 1 x daily - 7 x weekly - 3 sets - 10 reps - Supine Shoulder External Rotation with Dowel  - 1 x daily - 7 x weekly - 3 sets - 10 reps - Standing Shoulder Internal Rotation Stretch with Dowel  - 1 x daily - 7 x weekly - 3 sets - 10 reps - Seated Shoulder Flexion AAROM with Pulley Behind  - 1 x daily - 7 x weekly - 1 sets - 1 reps - 2 min hold - Standing Shoulder Internal Rotation AAROM with Pulley  - 1 x daily - 7 x weekly - 3 sets - 10 reps - Seated Shoulder Scaption AAROM with Pulley at Side  - 1 x daily - 7 x weekly - 3 sets - 10 reps  ASSESSMENT:  CLINICAL IMPRESSION: Cornie is progressing appropriately.  He has discontinued the sling.  He is compliant with HEP and well motivated.  Had cortisone injection in right shoulder and referral for the right shoulder.  He was able to complete all exercises today with minimal pain.  He would  benefit from continuing skilled PT for RCR protocol.     OBJECTIVE IMPAIRMENTS: decreased mobility, decreased ROM, decreased strength, hypomobility, increased fascial restrictions, increased muscle spasms, impaired UE functional use,  postural dysfunction, and pain.   ACTIVITY LIMITATIONS: carrying, lifting, transfers, bed mobility, bathing, toileting, dressing, reach over head, and hygiene/grooming  PARTICIPATION LIMITATIONS: meal prep, cleaning, laundry, driving, shopping, community activity, occupation, and yard work  PERSONAL FACTORS: Fitness, Past/current experiences, and 1-2 comorbidities: anxiety, depression, PTSD  are also affecting patient's functional outcome.   REHAB POTENTIAL: Good  CLINICAL DECISION MAKING: Stable/uncomplicated  EVALUATION COMPLEXITY: Low  GOALS: Goals reviewed with patient? Yes  SHORT TERM GOALS: Target date: 03/09/2023   Pain report to be no greater than 4/10  Baseline: Goal status: MET 03/19/23  2.  Patient will be independent with initial HEP  Baseline:  Goal status: MET 03/04/23  3.  ROM to improve to protocol limits Baseline:  Goal status: MET 02/23/23  LONG TERM GOALS: Target date: 04/06/2023    Patient to report pain no greater than 2/10  Baseline:  Goal status: MET 03/17/23  2.  Patient to be independent with advanced HEP  Baseline:  Goal status: INITIAL  3.  Patient to report 50% improvement in overall symptoms and use of left UE Baseline:  Goal status: INITIAL  4.  FOTO to be 67 Baseline: 41 Goal status: INITIAL  5.  ROM to be [facility] Baseline:  Goal status: INITIAL  6.  Strength to be 4 to 4+/5 all motions of left shoulder Baseline:  Goal status: INITIAL  PLAN: PT FREQUENCY:  3 times per week for first 3-4 weeks then 2 times per week.   Updated 03/04/23 patient is ahead of protocol, decreasing frequency to 1 time per week until patient is 6 weeks post op.    PT DURATION: 8 weeks  PLANNED INTERVENTIONS: Therapeutic exercises, Therapeutic activity, Neuromuscular re-education, Balance training, Gait training, Patient/Family education, Self Care, Joint mobilization, Aquatic Therapy, Dry Needling, Electrical stimulation, Cryotherapy, Moist heat, scar  mobilization, Taping, Vasopneumatic device, Traction, Ultrasound, Ionotophoresis 4mg /ml Dexamethasone, Manual therapy, and Re-evaluation  PLAN FOR NEXT SESSION: Continue protocol, PROM, and begin 12 post op activities      Seniyah Esker B. Darel Ricketts, PT 04/20/23 11:28 PM  [facility] [address], [address] [place], [place] [address] Phone # [phone] Fax [phone]

## 2023-04-22 ENCOUNTER — Ambulatory Visit: Payer: Medicare Other

## 2023-04-22 DIAGNOSIS — M25612 Stiffness of left shoulder, not elsewhere classified: Secondary | ICD-10-CM

## 2023-04-22 DIAGNOSIS — M25512 Pain in left shoulder: Secondary | ICD-10-CM

## 2023-04-22 DIAGNOSIS — M6281 Muscle weakness (generalized): Secondary | ICD-10-CM

## 2023-04-22 DIAGNOSIS — M25511 Pain in right shoulder: Secondary | ICD-10-CM

## 2023-04-22 DIAGNOSIS — R252 Cramp and spasm: Secondary | ICD-10-CM

## 2023-04-22 NOTE — Therapy (Signed)
OUTPATIENT PHYSICAL THERAPY UPPER EXTREMITY TREATMENT NOTE   Patient Name: Tom White MRN: [id number] DOB:26-Sep-1961, 62 y.o., male Today's Date: 04/22/2023  END OF SESSION:  PT End of Session - 04/22/23 1655     Visit Number 14    Date for PT Re-Evaluation 06/01/23    Authorization Type [employer] MEDICARE    PT Start Time 1615    PT Stop Time 1655    PT Time Calculation (min) 40 min    Activity Tolerance Patient tolerated treatment well    Behavior During Therapy WFL for tasks assessed/performed              Past Medical History:  Diagnosis Date   Arthritis    "had it in my left ankle" (05/04/2017)   Environmental and seasonal allergies 06/07/2015   Fibrosis of subtalar joint, 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   Mild cognitive impairment of uncertain or unknown etiology 09/11/2021   Mixed hyperlipidemia 06/14/2016   Pain in left ankle and joints of left foot 06/17/2017   Pain in rectum    Pneumonia 2017   PTSD (post-traumatic stress disorder) 01/30/2020   Slow transit constipation 04/02/2021   Urinary incontinence    Past Surgical History:  Procedure Laterality Date   ANKLE ARTHROSCOPY Left ~ 2013   "scraped out arthritis"   ANKLE ARTHROSCOPY WITH FUSION Left 09/22/2017   Procedure: LEFT ANKLE POSTERIOR ARTHROSCOPIC SUBTALAR ARTHRODESIS;  Surgeon: Nadara Mustard, MD;  Location: [facility] OR;  Service: Orthopedics;  Laterality: Left;   ANKLE FRACTURE SURGERY Left 12/1980   APPLICATION OF WOUND VAC Right 05/03/2017   foot   APPLICATION OF WOUND VAC Right 05/03/2017    Procedure: APPLICATION OF WOUND VAC;  Surgeon: Eldred Manges, MD;  Location: [facility] OR;  Service: Orthopedics;  Laterality: Right;   FRACTURE SURGERY     I & D EXTREMITY Right 05/03/2017   foot   I & D EXTREMITY Right 05/03/2017   Procedure: IRRIGATION AND DEBRIDEMENT EXTREMITY RIGHT, REPAIR OF POSTERIOR TIBIAL TENDON;  Surgeon: Eldred Manges, MD;  Location: [facility] OR;  Service: Orthopedics;  Laterality: Right;   LAPAROSCOPIC CHOLECYSTECTOMY  1996?   POSTERIOR TIBIAL TENDON REPAIR Right 05/03/2017   SUBTALAR JOINT ARTHROEREISIS Left 1985?   TONSILLECTOMY     Patient Active Problem List   Diagnosis Date Noted   GERD (gastroesophageal reflux disease) 09/11/2021   Mild cognitive impairment of uncertain or unknown etiology 09/11/2021   Urinary incontinence    Pain in rectum    Major depressive disorder    History of concussion    Malignant melanoma of skin of back 04/16/2021   Slow transit constipation 04/02/2021   PTSD (post-traumatic stress disorder) 01/30/2020   Pain in left ankle and joints of left foot 06/17/2017   Fibrosis of subtalar joint, left 01/21/2017   Mixed hyperlipidemia 06/14/2016   Environmental and seasonal allergies 06/07/2015   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   Generalized anxiety disorder 06/07/2015    PCP: Eartha Inch, MD   REFERRING PROVIDER: Francena Hanly,  MD  REFERRING DIAG: M75.42 (ICD-10-CM) - Impingement syndrome of left shoulder  THERAPY DIAG:  Acute pain of left shoulder  Stiffness of left shoulder, not elsewhere classified  Acute pain of right shoulder  Muscle weakness (generalized)  Cramp and spasm  Rationale for Evaluation and Treatment: Rehabilitation  ONSET DATE: 01/26/2023  SUBJECTIVE:                                                                                                                                                                                      SUBJECTIVE STATEMENT: Patient is 12 weeks post op  tomorrow.  He states his right upper trap and neck were "really hurting" after last visit.  He applied ice and this seemed to calm it down.  He states he is having pain in both bicpes R>L.        Hand dominance: Ambidextrous  PERTINENT HISTORY: na  PAIN:  04/20/23: Are you having pain?  Left 2-3 at rest, with movement 5-6/10: right shoulder 4-5/10 at the distal biceps and impingement zone area   PRECAUTIONS: Other: no active motion and limit elbow extension past neutral: see protocol  WEIGHT BEARING RESTRICTIONS: No as of 04/20/23  FALLS:  Has patient fallen in last 6 months? No  LIVING ENVIRONMENT: Lives with: lives with their spouse Lives in: House/apartment  OCCUPATION: desk  PLOF: Independent, Independent with basic ADLs, Independent with household mobility without device, Independent with community mobility without device, Independent with homemaking with ambulation, Independent with gait, and Independent with transfers  PATIENT GOALS: He hopes to have full use of his arm and be able to resume his prior level of function.  For right shoulder, he would like to resolve pain and avoid surgery on right shoulder.  NEXT MD VISIT: 16 weeks post op  OBJECTIVE:   DIAGNOSTIC FINDINGS:  na  PATIENT SURVEYS :  Initial eval: FOTO 40, goal is 65  04/20/23 FOTO : 49, goal is 61  Special Tests:  Pos speeds test on right 04/20/23  COGNITION: Overall cognitive status: Within functional limits for tasks assessed     SENSATION: WFL  POSTURE: Rounded shoulders  UPPER EXTREMITY ROM:   Passive ROM Right eval Left eval Right 04/20/23 Left 04/20/23  Shoulder flexion  90 155 167  Shoulder extension      Shoulder abduction  70 148 148  Shoulder adduction      Shoulder internal rotation  30 60 65  Shoulder external rotation  30 WNL WNL  Elbow flexion  90    Elbow extension  90    (Blank rows = not tested)  UPPER EXTREMITY MMT:  Deferred on initial eval  MMT Right eval  Right  04/20/23 Left eval Left 04/20/23  Shoulder flexion  4    Shoulder extension      Shoulder abduction  4    Shoulder adduction      Shoulder internal rotation  4    Shoulder external rotation  4-    Middle trapezius      Lower trapezius      Elbow flexion      Elbow extension      Wrist flexion      Wrist extension      Wrist ulnar deviation      Wrist radial deviation      Wrist pronation      Wrist supination      Grip strength (lbs)      (Blank rows = not tested)    TODAY'S TREATMENT:                                                                                                                                         DATE:  04/22/23 UBE x 6 min fwd only level 1 Reviewed HEP and modifications, discussed protocol restrictions and instructed patient to avoid overloading the biceps.   Trigger Point Dry-Needling  Treatment instructions: Expect mild to moderate muscle soreness. S/S of pneumothorax if dry needled over a lung field, and to seek immediate medical attention should they occur. Patient verbalized understanding of these instructions and education. Patient Consent Given: Yes Education handout provided: Yes Muscles treated: bilateral upper traps in prone and supine, bilateral biceps  Electrical stimulation performed: No Parameters: N/A Treatment response/outcome: Skilled palpation used to identify taut bands and trigger points.  Once identified, dry needling techniques used to treat these areas.  Twitch response ellicited along with palpable elongation of muscle.  Following treatment, patient reports significant relief of tension in the muscle and decreased overall pain in the biceps and upper traps along with decreased neck pain.     04/20/23 UBE x 6 min fwd only level 1 3 way scapular stabilization with green loop x 10 each UE Light blue physio ball 4D ball rolls x 20 each UE Prone extension, row and horizontal abduction x 20 each with 1 # Side lying ER x 20 with  1#  each UE Supine serratus punch with 1# x 20 bilaterally PROM left shoulder : flexion, abduction, ER, IR; PROM pro/sup with elbow bent Reviewed current protocol restrictions and precautions  03/25/23 UBE x 6 min fwd only level 1 Light blue physio ball on back counter 4D ball rolls x 20 each Fwd bent row with limited biceps activation x 20 1# Fwd bent shoulder extension limited range to protect the biceps x 20 1# Pulleys x 2 min each : flexion, scaption and IR  Supine shoulder stabilization at 90 degrees flexion with perturbations  PROM left shoulder : flexion, abduction, ER, IR; PROM pro/sup with elbow bent  Ice to left shoulder in supine hook lying x 10 min   PATIENT EDUCATION: Education details: Pain control, HEP Person educated: Patient Education method: Explanation, Demonstration, and Verbal cues Education comprehension: verbalized understanding and returned demonstration  HOME EXERCISE PROGRAM: Access Code: [email or web address] URL: [email or web address] Date: 03/17/2023 Prepared by: Mikey Kirschner  Exercises - Supine Shoulder Flexion Extension AAROM with Dowel  - 1 x daily - 7 x weekly - 3 sets - 10 reps - Supine Shoulder Abduction AAROM with Dowel  - 1 x daily - 7 x weekly - 3 sets - 10 reps - Supine Shoulder External Rotation with Dowel  - 1 x daily - 7 x weekly - 3 sets - 10 reps - Standing Shoulder Internal Rotation Stretch with Dowel  - 1 x daily - 7 x weekly - 3 sets - 10 reps - Seated Shoulder Flexion AAROM with Pulley Behind  - 1 x daily - 7 x weekly - 1 sets - 1 reps - 2 min hold - Standing Shoulder Internal Rotation AAROM with Pulley  - 1 x daily - 7 x weekly - 3 sets - 10 reps - Seated Shoulder Scaption AAROM with Pulley at Side  - 1 x daily - 7 x weekly - 3 sets - 10 reps  ASSESSMENT:  CLINICAL IMPRESSION: Moo is having some residual biceps pain.  Difficult to gain proper GH mechanics due to his tight upper traps and pec areas.  He had significant relief  with the dry needling today.  He will rest tonight and resume HEP tomorrow if pain is still well controlled.  He would benefit from continued skilled PT to restore proper GH arthrokinematics and pain free functional ROM.    OBJECTIVE IMPAIRMENTS: decreased mobility, decreased ROM, decreased strength, hypomobility, increased fascial restrictions, increased muscle spasms, impaired UE functional use, postural dysfunction, and pain.   ACTIVITY LIMITATIONS: carrying, lifting, transfers, bed mobility, bathing, toileting, dressing, reach over head, and hygiene/grooming  PARTICIPATION LIMITATIONS: meal prep, cleaning, laundry, driving, shopping, community activity, occupation, and yard work  PERSONAL FACTORS: Fitness, Past/current experiences, and 1-2 comorbidities: anxiety, depression, PTSD  are also affecting patient's functional outcome.   REHAB POTENTIAL: Good  CLINICAL DECISION MAKING: Stable/uncomplicated  EVALUATION COMPLEXITY: Low  GOALS: Goals reviewed with patient? Yes  SHORT TERM GOALS: Target date: 03/09/2023   Pain report to be no greater than 4/10  Baseline: Goal status: MET 03/19/23  2.  Patient will be independent with initial HEP  Baseline:  Goal status: MET 03/04/23  3.  ROM to improve to protocol limits Baseline:  Goal status: MET 02/23/23  LONG TERM GOALS: Target date: 04/06/2023    Patient to report pain no greater than 2/10  Baseline:  Goal status: MET 03/17/23  2.  Patient to be independent with advanced HEP  Baseline:  Goal status: INITIAL  3.  Patient to report 50% improvement in overall symptoms and use of left UE Baseline:  Goal status: INITIAL  4.  FOTO to be 67 Baseline: 41 Goal status: INITIAL  5.  ROM to be [facility] Baseline:  Goal status: INITIAL  6.  Strength to be 4 to 4+/5 all motions of left shoulder Baseline:  Goal status: INITIAL  PLAN: PT FREQUENCY:  3 times per week for first 3-4 weeks then 2 times per week.   Updated 03/04/23 patient is  ahead of protocol, decreasing frequency to 1 time per week until patient is 6 weeks post op.    PT DURATION: 8 weeks  PLANNED INTERVENTIONS:  Therapeutic exercises, Therapeutic activity, Neuromuscular re-education, Balance training, Gait training, Patient/Family education, Self Care, Joint mobilization, Aquatic Therapy, Dry Needling, Electrical stimulation, Cryotherapy, Moist heat, scar mobilization, Taping, Vasopneumatic device, Traction, Ultrasound, Ionotophoresis 4mg /ml Dexamethasone, Manual therapy, and Re-evaluation  PLAN FOR NEXT SESSION: Assess response to DN to biceps and upper traps, Continue protocol, PROM, and begin 12 week post op activities      Maribel Luis B. Duyen Beckom, PT 04/22/23 5:05 PM  [facility] [address], [address] [place], [place] [address] Phone # [phone] Fax [phone]

## 2023-04-28 ENCOUNTER — Ambulatory Visit: Payer: Medicare Other

## 2023-04-28 DIAGNOSIS — M6281 Muscle weakness (generalized): Secondary | ICD-10-CM

## 2023-04-28 DIAGNOSIS — M25512 Pain in left shoulder: Secondary | ICD-10-CM | POA: Diagnosis not present

## 2023-04-28 DIAGNOSIS — M25511 Pain in right shoulder: Secondary | ICD-10-CM

## 2023-04-28 DIAGNOSIS — R252 Cramp and spasm: Secondary | ICD-10-CM

## 2023-04-28 DIAGNOSIS — M25612 Stiffness of left shoulder, not elsewhere classified: Secondary | ICD-10-CM

## 2023-04-28 NOTE — Therapy (Signed)
OUTPATIENT PHYSICAL THERAPY UPPER EXTREMITY TREATMENT NOTE   Patient Name: Tom White MRN: [id number] DOB:1961/03/19, 62 y.o., male Today's Date: 04/28/2023  END OF SESSION:  PT End of Session - 04/28/23 0809     Visit Number 15    Date for PT Re-Evaluation 06/01/23    Authorization Type [employer] MEDICARE    Progress Note Due on Visit 20    PT Start Time 0804    PT Stop Time 0845    PT Time Calculation (min) 41 min    Activity Tolerance Patient limited by pain    Behavior During Therapy [facility] for tasks assessed/performed              Past Medical History:  Diagnosis Date   Arthritis    "had it in my left ankle" (05/04/2017)   Environmental and seasonal allergies 06/07/2015   Fibrosis of subtalar joint, 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   Mild cognitive impairment of uncertain or unknown etiology 09/11/2021   Mixed hyperlipidemia 06/14/2016   Pain in left ankle and joints of left foot 06/17/2017   Pain in rectum    Pneumonia 2017   PTSD (post-traumatic stress disorder) 01/30/2020   Slow transit constipation 04/02/2021   Urinary incontinence    Past Surgical History:  Procedure Laterality Date   ANKLE ARTHROSCOPY Left ~ 2013   "scraped out arthritis"   ANKLE ARTHROSCOPY WITH FUSION Left 09/22/2017   Procedure: LEFT ANKLE POSTERIOR ARTHROSCOPIC SUBTALAR ARTHRODESIS;  Surgeon: Nadara Mustard, MD;  Location: [facility] OR;  Service: Orthopedics;  Laterality: Left;   ANKLE FRACTURE SURGERY Left 12/1980   APPLICATION OF WOUND VAC Right 05/03/2017   foot   APPLICATION OF WOUND  VAC Right 05/03/2017   Procedure: APPLICATION OF WOUND VAC;  Surgeon: Eldred Manges, MD;  Location: [facility] OR;  Service: Orthopedics;  Laterality: Right;   FRACTURE SURGERY     I & D EXTREMITY Right 05/03/2017   foot   I & D EXTREMITY Right 05/03/2017   Procedure: IRRIGATION AND DEBRIDEMENT EXTREMITY RIGHT, REPAIR OF POSTERIOR TIBIAL TENDON;  Surgeon: Eldred Manges, MD;  Location: [facility] OR;  Service: Orthopedics;  Laterality: Right;   LAPAROSCOPIC CHOLECYSTECTOMY  1996?   POSTERIOR TIBIAL TENDON REPAIR Right 05/03/2017   SUBTALAR JOINT ARTHROEREISIS Left 1985?   TONSILLECTOMY     Patient Active Problem List   Diagnosis Date Noted   GERD (gastroesophageal reflux disease) 09/11/2021   Mild cognitive impairment of uncertain or unknown etiology 09/11/2021   Urinary incontinence    Pain in rectum    Major depressive disorder    History of concussion    Malignant melanoma of skin of back 04/16/2021   Slow transit constipation 04/02/2021   PTSD (post-traumatic stress disorder) 01/30/2020   Pain in left ankle and joints of left foot 06/17/2017   Fibrosis of subtalar joint, left 01/21/2017   Mixed hyperlipidemia 06/14/2016   Environmental and seasonal allergies 06/07/2015   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   Generalized anxiety disorder 06/07/2015    PCP: Cyndia Bent,  Kayleen Memos, MD   REFERRING PROVIDER: Francena Hanly, MD  REFERRING DIAG: [phone] (ICD-10-CM) - Impingement syndrome of left shoulder  THERAPY DIAG:  Acute pain of left shoulder  Stiffness of left shoulder, not elsewhere classified  Acute pain of right shoulder  Muscle weakness (generalized)  Cramp and spasm  Rationale for Evaluation and Treatment: Rehabilitation  ONSET DATE: 01/26/2023  SUBJECTIVE:                                                                                                                                                                                      SUBJECTIVE  STATEMENT: Patient is 12 weeks post op.  He continues to have bilateral biceps pain to the point of taking an oxycodone last night.  He states he took the oxycodone at 10-11 pm and didn't go to sleep until around 4 am.  Once he woke up, he states the pain was still there.  Pain level 9/10 on left and right 5/10 left (left is a costant nagging pain).      Hand dominance: Ambidextrous  PERTINENT HISTORY: na  PAIN:  04/28/23: Are you having pain? Pain level 9/10 on left and right 5/10 left (left is a costant nagging pain).      PRECAUTIONS: Other: no active motion and limit elbow extension past neutral: see protocol  WEIGHT BEARING RESTRICTIONS: No as of 04/20/23  FALLS:  Has patient fallen in last 6 months? No  LIVING ENVIRONMENT: Lives with: lives with their spouse Lives in: House/apartment  OCCUPATION: desk  PLOF: Independent, Independent with basic ADLs, Independent with household mobility without device, Independent with community mobility without device, Independent with homemaking with ambulation, Independent with gait, and Independent with transfers  PATIENT GOALS: He hopes to have full use of his arm and be able to resume his prior level of function.  For right shoulder, he would like to resolve pain and avoid surgery on right shoulder.  NEXT MD VISIT: 16 weeks post op  OBJECTIVE:   DIAGNOSTIC FINDINGS:  na  PATIENT SURVEYS :  Initial eval: FOTO 40, goal is 32  04/20/23 FOTO : 49, goal is 55  Special Tests:  Pos speeds test on right 04/20/23  COGNITION: Overall cognitive status: Within functional limits for tasks assessed     SENSATION: WFL  POSTURE: Rounded shoulders  UPPER EXTREMITY ROM:   Passive ROM Right eval Left eval Right 04/20/23 Left 04/20/23  Shoulder flexion  90 155 167  Shoulder extension      Shoulder abduction  70 148 148  Shoulder adduction      Shoulder internal rotation  30 60 65  Shoulder external rotation  30  WNL WNL  Elbow  flexion  90    Elbow extension  90    (Blank rows = not tested)  UPPER EXTREMITY MMT:  Deferred on initial eval  MMT Right eval Right  04/20/23 Left eval Left 04/20/23  Shoulder flexion  4    Shoulder extension      Shoulder abduction  4    Shoulder adduction      Shoulder internal rotation  4    Shoulder external rotation  4-    Middle trapezius      Lower trapezius      Elbow flexion      Elbow extension      Wrist flexion      Wrist extension      Wrist ulnar deviation      Wrist radial deviation      Wrist pronation      Wrist supination      Grip strength (lbs)      (Blank rows = not tested)    TODAY'S TREATMENT:                                                                                                                                         DATE:  04/28/23 UBE x 6 min fwd only level 1 Manual STM to bilateral biceps along with IASTM x 15 min each UE, Passive elbow extension with biceps STM, then proceeded with TP DN below  Trigger Point Dry-Needling  Treatment instructions: Expect mild to moderate muscle soreness. S/S of pneumothorax if dry needled over a lung field, and to seek immediate medical attention should they occur. Patient verbalized understanding of these instructions and education. Patient Consent Given: Yes Education handout provided: Yes Muscles treated: bilateral biceps  Electrical stimulation performed: No Parameters: N/A Treatment response/outcome: Skilled palpation used to identify taut bands and trigger points.  Once identified, dry needling techniques used to treat these areas.  Twitch response ellicited along with palpable elongation of muscle.  Following treatment, patient reports significant relief of tension in the muscle and decreased overall pain in the biceps and upper traps along with decreased neck pain.     04/22/23 UBE x 6 min fwd only level 1 Reviewed HEP and modifications, discussed protocol restrictions and instructed patient to  avoid overloading the biceps.   Trigger Point Dry-Needling  Treatment instructions: Expect mild to moderate muscle soreness. S/S of pneumothorax if dry needled over a lung field, and to seek immediate medical attention should they occur. Patient verbalized understanding of these instructions and education. Patient Consent Given: Yes Education handout provided: Yes Muscles treated: bilateral upper traps in prone and supine, bilateral biceps  Electrical stimulation performed: No Parameters: N/A Treatment response/outcome: Skilled palpation used to identify taut bands and trigger points.  Once identified, dry needling techniques used to treat these areas.  Twitch response ellicited along with palpable elongation of muscle.  Following  treatment, patient reports significant relief of tension in the muscle and decreased overall pain in the biceps and upper traps along with decreased neck pain.     04/20/23 UBE x 6 min fwd only level 1 3 way scapular stabilization with green loop x 10 each UE Light blue physio ball 4D ball rolls x 20 each UE Prone extension, row and horizontal abduction x 20 each with 1 # Side lying ER x 20 with 1#  each UE Supine serratus punch with 1# x 20 bilaterally PROM left shoulder : flexion, abduction, ER, IR; PROM pro/sup with elbow bent Reviewed current protocol restrictions and precautions  PATIENT EDUCATION: Education details: Pain control, HEP Person educated: Patient Education method: Explanation, Demonstration, and Verbal cues Education comprehension: verbalized understanding and returned demonstration  HOME EXERCISE PROGRAM: Access Code: [email or web address] URL: [email or web address] Date: 03/17/2023 Prepared by: Mikey Kirschner  Exercises - Supine Shoulder Flexion Extension AAROM with Dowel  - 1 x daily - 7 x weekly - 3 sets - 10 reps - Supine Shoulder Abduction AAROM with Dowel  - 1 x daily - 7 x weekly - 3 sets - 10 reps - Supine Shoulder External  Rotation with Dowel  - 1 x daily - 7 x weekly - 3 sets - 10 reps - Standing Shoulder Internal Rotation Stretch with Dowel  - 1 x daily - 7 x weekly - 3 sets - 10 reps - Seated Shoulder Flexion AAROM with Pulley Behind  - 1 x daily - 7 x weekly - 1 sets - 1 reps - 2 min hold - Standing Shoulder Internal Rotation AAROM with Pulley  - 1 x daily - 7 x weekly - 3 sets - 10 reps - Seated Shoulder Scaption AAROM with Pulley at Side  - 1 x daily - 7 x weekly - 3 sets - 10 reps  ASSESSMENT:  CLINICAL IMPRESSION: Daivon is having consistent biceps pain but both biceps appear intact.  We are having to hold on progressing through protocol due to this pain.  He has palpable taut bands and trigger point in both biceps.  He had very pronounced twitch response in both during DN.  He also demonstrated significant petechiae during IASTM on both biceps R>L.  His biceps pain could be attributed to attempting to restore proper GH arthrokinematics which is correcting his posture significantly.  His usual carrying angle is observably slightly flexed as well.  We will continue to work on elongating this tissue and resolving the biceps pain.   He would benefit from continued skilled PT to restore proper GH arthrokinematics and pain free functional ROM.    OBJECTIVE IMPAIRMENTS: decreased mobility, decreased ROM, decreased strength, hypomobility, increased fascial restrictions, increased muscle spasms, impaired UE functional use, postural dysfunction, and pain.   ACTIVITY LIMITATIONS: carrying, lifting, transfers, bed mobility, bathing, toileting, dressing, reach over head, and hygiene/grooming  PARTICIPATION LIMITATIONS: meal prep, cleaning, laundry, driving, shopping, community activity, occupation, and yard work  PERSONAL FACTORS: Fitness, Past/current experiences, and 1-2 comorbidities: anxiety, depression, PTSD  are also affecting patient's functional outcome.   REHAB POTENTIAL: Good  CLINICAL DECISION MAKING:  Stable/uncomplicated  EVALUATION COMPLEXITY: Low  GOALS: Goals reviewed with patient? Yes  SHORT TERM GOALS: Target date: 03/09/2023   Pain report to be no greater than 4/10  Baseline: Goal status: MET 03/19/23  2.  Patient will be independent with initial HEP  Baseline:  Goal status: MET 03/04/23  3.  ROM to improve to protocol limits Baseline:  Goal status: MET 02/23/23  LONG TERM GOALS: Target date: 04/06/2023    Patient to report pain no greater than 2/10  Baseline:  Goal status: MET 03/17/23  2.  Patient to be independent with advanced HEP  Baseline:  Goal status: INITIAL  3.  Patient to report 50% improvement in overall symptoms and use of left UE Baseline:  Goal status: INITIAL  4.  FOTO to be 67 Baseline: 41 Goal status: INITIAL  5.  ROM to be [facility] Baseline:  Goal status: INITIAL  6.  Strength to be 4 to 4+/5 all motions of left shoulder Baseline:  Goal status: INITIAL  PLAN: PT FREQUENCY:  3 times per week for first 3-4 weeks then 2 times per week.   Updated 03/04/23 patient is ahead of protocol, decreasing frequency to 1 time per week until patient is 6 weeks post op.    PT DURATION: 8 weeks  PLANNED INTERVENTIONS: Therapeutic exercises, Therapeutic activity, Neuromuscular re-education, Balance training, Gait training, Patient/Family education, Self Care, Joint mobilization, Aquatic Therapy, Dry Needling, Electrical stimulation, Cryotherapy, Moist heat, scar mobilization, Taping, Vasopneumatic device, Traction, Ultrasound, Ionotophoresis 4mg /ml Dexamethasone, Manual therapy, and Re-evaluation  PLAN FOR NEXT SESSION: Assess response to DN  and manual techniques to bilateral biceps, Continue protocol, PROM, and progress 12 week post op activities      Amiel Mccaffrey B. Amante Fomby, PT 04/28/23 10:05 AM  [facility] [address] [place], [place] [address] Phone # [phone] Fax [phone]

## 2023-04-30 ENCOUNTER — Ambulatory Visit: Payer: Medicare Other

## 2023-04-30 DIAGNOSIS — R293 Abnormal posture: Secondary | ICD-10-CM

## 2023-04-30 DIAGNOSIS — M25612 Stiffness of left shoulder, not elsewhere classified: Secondary | ICD-10-CM

## 2023-04-30 DIAGNOSIS — M25512 Pain in left shoulder: Secondary | ICD-10-CM | POA: Diagnosis not present

## 2023-04-30 DIAGNOSIS — M6281 Muscle weakness (generalized): Secondary | ICD-10-CM

## 2023-04-30 DIAGNOSIS — M25511 Pain in right shoulder: Secondary | ICD-10-CM

## 2023-04-30 DIAGNOSIS — R252 Cramp and spasm: Secondary | ICD-10-CM

## 2023-04-30 NOTE — Therapy (Signed)
OUTPATIENT PHYSICAL THERAPY UPPER EXTREMITY TREATMENT NOTE   Patient Name: Torron Fehrman MRN: [id number] DOB:07-16-1961, 62 y.o., male Today's Date: 04/30/2023  END OF SESSION:  PT End of Session - 04/30/23 1028     Visit Number 16    Date for PT Re-Evaluation 06/01/23    Authorization Type [employer] MEDICARE    Progress Note Due on Visit 20    PT Start Time 1020    PT Stop Time 1100    PT Time Calculation (min) 40 min    Activity Tolerance Patient limited by pain    Behavior During Therapy [facility] for tasks assessed/performed              Past Medical History:  Diagnosis Date   Arthritis    "had it in my left ankle" (05/04/2017)   Environmental and seasonal allergies 06/07/2015   Fibrosis of subtalar joint, 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   Mild cognitive impairment of uncertain or unknown etiology 09/11/2021   Mixed hyperlipidemia 06/14/2016   Pain in left ankle and joints of left foot 06/17/2017   Pain in rectum    Pneumonia 2017   PTSD (post-traumatic stress disorder) 01/30/2020   Slow transit constipation 04/02/2021   Urinary incontinence    Past Surgical History:  Procedure Laterality Date   ANKLE ARTHROSCOPY Left ~ 2013   "scraped out arthritis"   ANKLE ARTHROSCOPY WITH FUSION Left 09/22/2017   Procedure: LEFT ANKLE POSTERIOR ARTHROSCOPIC SUBTALAR ARTHRODESIS;  Surgeon: Nadara Mustard, MD;  Location: [facility] OR;  Service: Orthopedics;  Laterality: Left;   ANKLE FRACTURE SURGERY Left 12/1980   APPLICATION OF WOUND VAC Right 05/03/2017   foot   APPLICATION OF WOUND  VAC Right 05/03/2017   Procedure: APPLICATION OF WOUND VAC;  Surgeon: Eldred Manges, MD;  Location: [facility] OR;  Service: Orthopedics;  Laterality: Right;   FRACTURE SURGERY     I & D EXTREMITY Right 05/03/2017   foot   I & D EXTREMITY Right 05/03/2017   Procedure: IRRIGATION AND DEBRIDEMENT EXTREMITY RIGHT, REPAIR OF POSTERIOR TIBIAL TENDON;  Surgeon: Eldred Manges, MD;  Location: [facility] OR;  Service: Orthopedics;  Laterality: Right;   LAPAROSCOPIC CHOLECYSTECTOMY  1996?   POSTERIOR TIBIAL TENDON REPAIR Right 05/03/2017   SUBTALAR JOINT ARTHROEREISIS Left 1985?   TONSILLECTOMY     Patient Active Problem List   Diagnosis Date Noted   GERD (gastroesophageal reflux disease) 09/11/2021   Mild cognitive impairment of uncertain or unknown etiology 09/11/2021   Urinary incontinence    Pain in rectum    Major depressive disorder    History of concussion    Malignant melanoma of skin of back 04/16/2021   Slow transit constipation 04/02/2021   PTSD (post-traumatic stress disorder) 01/30/2020   Pain in left ankle and joints of left foot 06/17/2017   Fibrosis of subtalar joint, left 01/21/2017   Mixed hyperlipidemia 06/14/2016   Environmental and seasonal allergies 06/07/2015   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   Generalized anxiety disorder 06/07/2015    PCP: Cyndia Bent,  Kayleen Memos, MD   REFERRING PROVIDER: Francena Hanly, MD  REFERRING DIAG: [phone] (ICD-10-CM) - Impingement syndrome of left shoulder  THERAPY DIAG:  Acute pain of left shoulder  Stiffness of left shoulder, not elsewhere classified  Acute pain of right shoulder  Muscle weakness (generalized)  Cramp and spasm  Abnormal posture  Rationale for Evaluation and Treatment: Rehabilitation  ONSET DATE: 01/26/2023  SUBJECTIVE:                                                                                                                                                                                       SUBJECTIVE STATEMENT: Patient reports that the biceps are sore but not as painful since last visit.     Hand dominance: Ambidextrous  PERTINENT HISTORY: na  PAIN:  04/30/23: Are you having pain? Pain level much improved.     PRECAUTIONS: Other: no active motion and limit elbow extension past neutral: see protocol  WEIGHT BEARING RESTRICTIONS: No as of 04/20/23  FALLS:  Has patient fallen in last 6 months? No  LIVING ENVIRONMENT: Lives with: lives with their spouse Lives in: House/apartment  OCCUPATION: desk  PLOF: Independent, Independent with basic ADLs, Independent with household mobility without device, Independent with community mobility without device, Independent with homemaking with ambulation, Independent with gait, and Independent with transfers  PATIENT GOALS: He hopes to have full use of his arm and be able to resume his prior level of function.  For right shoulder, he would like to resolve pain and avoid surgery on right shoulder.  NEXT MD VISIT: 16 weeks post op  OBJECTIVE:   DIAGNOSTIC FINDINGS:  na  PATIENT SURVEYS :  Initial eval: FOTO 40, goal is 25  04/20/23 FOTO : 49, goal is 90  Special Tests:  Pos speeds test on right 04/20/23  COGNITION: Overall cognitive status: Within functional limits for tasks assessed     SENSATION: WFL  POSTURE: Rounded shoulders  UPPER EXTREMITY ROM:   Passive ROM Right eval Left eval Right 04/20/23 Left 04/20/23  Shoulder flexion  90 155 167  Shoulder extension      Shoulder abduction  70 148 148  Shoulder adduction      Shoulder internal rotation  30 60 65  Shoulder external rotation  30 WNL WNL  Elbow flexion  90    Elbow extension  90    (Blank rows = not tested)  UPPER EXTREMITY MMT:  Deferred on initial eval  MMT Right eval Right  04/20/23 Left eval Left 04/20/23  Shoulder flexion  4    Shoulder extension      Shoulder abduction  4    Shoulder adduction  Shoulder internal rotation   4    Shoulder external rotation  4-    Middle trapezius      Lower trapezius      Elbow flexion      Elbow extension      Wrist flexion      Wrist extension      Wrist ulnar deviation      Wrist radial deviation      Wrist pronation      Wrist supination      Grip strength (lbs)      (Blank rows = not tested)    TODAY'S TREATMENT:                                                                                                                                         DATE:  04/30/23 UBE x 5 min fwd only level 1 3 way scapular stabilization with blue loop Bilateral shoulder ER with red tband x 20 Bilateral shoulder ER with red tband x 20 Bilateral shoulder extension x 20 with red tband  Bilateral shoulder rows x 20 with red tband Prone over box (24"side) Individual W, I, T, T, Y x 10 each with no weight bilateral shoulders Doorway stretch for pecs x 10 hold 10 sec Ice to bilateral shoulders in long sitting with bolster under knees x 10 min  04/28/23 UBE x 6 min fwd only level 1 Manual STM to bilateral biceps along with IASTM x 15 min each UE, Passive elbow extension with biceps STM, then proceeded with TP DN below  Trigger Point Dry-Needling  Treatment instructions: Expect mild to moderate muscle soreness. S/S of pneumothorax if dry needled over a lung field, and to seek immediate medical attention should they occur. Patient verbalized understanding of these instructions and education. Patient Consent Given: Yes Education handout provided: Yes Muscles treated: bilateral biceps  Electrical stimulation performed: No Parameters: N/A Treatment response/outcome: Skilled palpation used to identify taut bands and trigger points.  Once identified, dry needling techniques used to treat these areas.  Twitch response ellicited along with palpable elongation of muscle.  Following treatment, patient reports significant relief of tension in the muscle and decreased overall pain in the biceps and  upper traps along with decreased neck pain.     04/22/23 UBE x 6 min fwd only level 1 Reviewed HEP and modifications, discussed protocol restrictions and instructed patient to avoid overloading the biceps.   Trigger Point Dry-Needling  Treatment instructions: Expect mild to moderate muscle soreness. S/S of pneumothorax if dry needled over a lung field, and to seek immediate medical attention should they occur. Patient verbalized understanding of these instructions and education. Patient Consent Given: Yes Education handout provided: Yes Muscles treated: bilateral upper traps in prone and supine, bilateral biceps  Electrical stimulation performed: No Parameters: N/A Treatment response/outcome: Skilled palpation used to identify taut bands and trigger points.  Once identified,  dry needling techniques used to treat these areas.  Twitch response ellicited along with palpable elongation of muscle.  Following treatment, patient reports significant relief of tension in the muscle and decreased overall pain in the biceps and upper traps along with decreased neck pain.     PATIENT EDUCATION: Education details: Pain control, HEP Person educated: Patient Education method: Explanation, Demonstration, and Verbal cues Education comprehension: verbalized understanding and returned demonstration  HOME EXERCISE PROGRAM: Access Code: [email or web address] URL: [email or web address] Date: 03/17/2023 Prepared by: Mikey Kirschner  Exercises - Supine Shoulder Flexion Extension AAROM with Dowel  - 1 x daily - 7 x weekly - 3 sets - 10 reps - Supine Shoulder Abduction AAROM with Dowel  - 1 x daily - 7 x weekly - 3 sets - 10 reps - Supine Shoulder External Rotation with Dowel  - 1 x daily - 7 x weekly - 3 sets - 10 reps - Standing Shoulder Internal Rotation Stretch with Dowel  - 1 x daily - 7 x weekly - 3 sets - 10 reps - Seated Shoulder Flexion AAROM with Pulley Behind  - 1 x daily - 7 x weekly - 1 sets - 1  reps - 2 min hold - Standing Shoulder Internal Rotation AAROM with Pulley  - 1 x daily - 7 x weekly - 3 sets - 10 reps - Seated Shoulder Scaption AAROM with Pulley at Side  - 1 x daily - 7 x weekly - 3 sets - 10 reps  ASSESSMENT:  CLINICAL IMPRESSION: Rosanne Ashing returns with good resolution of the previous biceps pain.  He fatigued easily but completed all postural strengthening tasks today without increased pain.  We will focus on improving postural strength and anterior shoulder and pec tightness to reduce bi  He would benefit from continued skilled PT to restore proper GH arthrokinematics and pain free functional ROM.    OBJECTIVE IMPAIRMENTS: decreased mobility, decreased ROM, decreased strength, hypomobility, increased fascial restrictions, increased muscle spasms, impaired UE functional use, postural dysfunction, and pain.   ACTIVITY LIMITATIONS: carrying, lifting, transfers, bed mobility, bathing, toileting, dressing, reach over head, and hygiene/grooming  PARTICIPATION LIMITATIONS: meal prep, cleaning, laundry, driving, shopping, community activity, occupation, and yard work  PERSONAL FACTORS: Fitness, Past/current experiences, and 1-2 comorbidities: anxiety, depression, PTSD  are also affecting patient's functional outcome.   REHAB POTENTIAL: Good  CLINICAL DECISION MAKING: Stable/uncomplicated  EVALUATION COMPLEXITY: Low  GOALS: Goals reviewed with patient? Yes  SHORT TERM GOALS: Target date: 03/09/2023   Pain report to be no greater than 4/10  Baseline: Goal status: MET 03/19/23  2.  Patient will be independent with initial HEP  Baseline:  Goal status: MET 03/04/23  3.  ROM to improve to protocol limits Baseline:  Goal status: MET 02/23/23  LONG TERM GOALS: Target date: 04/06/2023    Patient to report pain no greater than 2/10  Baseline:  Goal status: MET 03/17/23  2.  Patient to be independent with advanced HEP  Baseline:  Goal status: INITIAL  3.  Patient to report  50% improvement in overall symptoms and use of left UE Baseline:  Goal status: INITIAL  4.  FOTO to be 67 Baseline: 41 Goal status: INITIAL  5.  ROM to be [facility] Baseline:  Goal status: INITIAL  6.  Strength to be 4 to 4+/5 all motions of left shoulder Baseline:  Goal status: INITIAL  PLAN: PT FREQUENCY:  3 times per week for first 3-4 weeks then 2 times per week.   Updated 03/04/23  patient is ahead of protocol, decreasing frequency to 1 time per week until patient is 6 weeks post op.    PT DURATION: 8 weeks  PLANNED INTERVENTIONS: Therapeutic exercises, Therapeutic activity, Neuromuscular re-education, Balance training, Gait training, Patient/Family education, Self Care, Joint mobilization, Aquatic Therapy, Dry Needling, Electrical stimulation, Cryotherapy, Moist heat, scar mobilization, Taping, Vasopneumatic device, Traction, Ultrasound, Ionotophoresis 4mg /ml Dexamethasone, Manual therapy, and Re-evaluation  PLAN FOR NEXT SESSION: Continue protocol, PROM, and progress 12 week post op activities      Vora Clover B. Antonis Lor, PT 04/30/23 8:44 PM  [facility] [address] [place], [place] [address] Phone # [phone] Fax [phone]

## 2023-05-04 ENCOUNTER — Ambulatory Visit: Payer: Medicare Other | Admitting: Physical Therapy

## 2023-05-04 DIAGNOSIS — M25512 Pain in left shoulder: Secondary | ICD-10-CM

## 2023-05-04 DIAGNOSIS — R293 Abnormal posture: Secondary | ICD-10-CM

## 2023-05-04 DIAGNOSIS — R252 Cramp and spasm: Secondary | ICD-10-CM

## 2023-05-04 DIAGNOSIS — M6281 Muscle weakness (generalized): Secondary | ICD-10-CM

## 2023-05-04 DIAGNOSIS — M25612 Stiffness of left shoulder, not elsewhere classified: Secondary | ICD-10-CM

## 2023-05-04 DIAGNOSIS — M25511 Pain in right shoulder: Secondary | ICD-10-CM

## 2023-05-04 NOTE — Therapy (Signed)
OUTPATIENT PHYSICAL THERAPY UPPER EXTREMITY TREATMENT NOTE   Patient Name: Tom White MRN: [id number] DOB:10-22-1960, 62 y.o., male Today's Date: 05/05/2023  END OF SESSION:  PT End of Session - 05/05/23 0646     Visit Number 17    Date for PT Re-Evaluation 06/01/23    Authorization Type [employer] MEDICARE    Progress Note Due on Visit 20    PT Start Time 1615    PT Stop Time 1655    PT Time Calculation (min) 40 min    Activity Tolerance Patient limited by pain;Patient tolerated treatment well    Behavior During Therapy [facility] for tasks assessed/performed               Past Medical History:  Diagnosis Date   Arthritis    "had it in my left ankle" (05/04/2017)   Environmental and seasonal allergies 06/07/2015   Fibrosis of subtalar joint, 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   Mild cognitive impairment of uncertain or unknown etiology 09/11/2021   Mixed hyperlipidemia 06/14/2016   Pain in left ankle and joints of left foot 06/17/2017   Pain in rectum    Pneumonia 2017   PTSD (post-traumatic stress disorder) 01/30/2020   Slow transit constipation 04/02/2021   Urinary incontinence    Past Surgical History:  Procedure Laterality Date   ANKLE ARTHROSCOPY Left ~ 2013   "scraped out arthritis"   ANKLE ARTHROSCOPY WITH FUSION Left 09/22/2017   Procedure: LEFT ANKLE POSTERIOR ARTHROSCOPIC SUBTALAR ARTHRODESIS;  Surgeon: Nadara Mustard, MD;  Location: [facility] OR;  Service: Orthopedics;  Laterality: Left;   ANKLE FRACTURE SURGERY Left 12/1980   APPLICATION OF WOUND VAC Right 05/03/2017    foot   APPLICATION OF WOUND VAC Right 05/03/2017   Procedure: APPLICATION OF WOUND VAC;  Surgeon: Eldred Manges, MD;  Location: [facility] OR;  Service: Orthopedics;  Laterality: Right;   FRACTURE SURGERY     I & D EXTREMITY Right 05/03/2017   foot   I & D EXTREMITY Right 05/03/2017   Procedure: IRRIGATION AND DEBRIDEMENT EXTREMITY RIGHT, REPAIR OF POSTERIOR TIBIAL TENDON;  Surgeon: Eldred Manges, MD;  Location: [facility] OR;  Service: Orthopedics;  Laterality: Right;   LAPAROSCOPIC CHOLECYSTECTOMY  1996?   POSTERIOR TIBIAL TENDON REPAIR Right 05/03/2017   SUBTALAR JOINT ARTHROEREISIS Left 1985?   TONSILLECTOMY     Patient Active Problem List   Diagnosis Date Noted   GERD (gastroesophageal reflux disease) 09/11/2021   Mild cognitive impairment of uncertain or unknown etiology 09/11/2021   Urinary incontinence    Pain in rectum    Major depressive disorder    History of concussion    Malignant melanoma of skin of back 04/16/2021   Slow transit constipation 04/02/2021   PTSD (post-traumatic stress disorder) 01/30/2020   Pain in left ankle and joints of left foot 06/17/2017   Fibrosis of subtalar joint, left 01/21/2017   Mixed hyperlipidemia 06/14/2016   Environmental and seasonal allergies 06/07/2015   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   Generalized anxiety disorder 06/07/2015  PCP: Eartha Inch, MD   REFERRING PROVIDER: Francena Hanly, MD  REFERRING DIAG: [phone] (ICD-10-CM) - Impingement syndrome of left shoulder  THERAPY DIAG:  Acute pain of left shoulder  Stiffness of left shoulder, not elsewhere classified  Acute pain of right shoulder  Muscle weakness (generalized)  Cramp and spasm  Abnormal posture  Rationale for Evaluation and Treatment: Rehabilitation  ONSET DATE: 01/26/2023  SUBJECTIVE:                                                                                                                                                                                       SUBJECTIVE STATEMENT: Pt states that he fell and landed on his Lt shoulder. Not sure how he landed exactly. His Lt shoulder is more sore than normal.  Hand dominance: Ambidextrous  PERTINENT HISTORY: na  PAIN:  04/30/23: Are you having pain? Pain level much improved.     PRECAUTIONS: Other: no active motion and limit elbow extension past neutral: see protocol  WEIGHT BEARING RESTRICTIONS: No as of 04/20/23  FALLS:  Has patient fallen in last 6 months? No  LIVING ENVIRONMENT: Lives with: lives with their spouse Lives in: House/apartment  OCCUPATION: desk  PLOF: Independent, Independent with basic ADLs, Independent with household mobility without device, Independent with community mobility without device, Independent with homemaking with ambulation, Independent with gait, and Independent with transfers  PATIENT GOALS: He hopes to have full use of his arm and be able to resume his prior level of function.  For right shoulder, he would like to resolve pain and avoid surgery on right shoulder.  NEXT MD VISIT: 16 weeks post op  OBJECTIVE:   DIAGNOSTIC FINDINGS:  na  PATIENT SURVEYS :  Initial eval: FOTO 40, goal is 101  04/20/23 FOTO : 49, goal is 70  Special Tests:  Pos speeds test on right 04/20/23  COGNITION: Overall cognitive status: Within functional limits for tasks assessed     SENSATION: WFL  POSTURE: Rounded shoulders  UPPER EXTREMITY ROM:   Passive ROM Right eval Left eval Right 04/20/23 Left 04/20/23  Shoulder flexion  90 155 167  Shoulder extension      Shoulder abduction  70 148 148  Shoulder adduction      Shoulder internal rotation  30 60 65  Shoulder external rotation  30 WNL WNL  Elbow flexion  90    Elbow extension  90    (Blank rows = not tested)  UPPER EXTREMITY MMT:  Deferred on initial eval  MMT Right eval Right  04/20/23 Left eval Left 04/20/23  Shoulder flexion  4    Shoulder extension  Shoulder  abduction  4    Shoulder adduction      Shoulder internal rotation  4    Shoulder external rotation  4-    Middle trapezius      Lower trapezius      Elbow flexion      Elbow extension      Wrist flexion      Wrist extension      Wrist ulnar deviation      Wrist radial deviation      Wrist pronation      Wrist supination      Grip strength (lbs)      (Blank rows = not tested)    TODAY'S TREATMENT:                                                                                                                                         DATE:  05/04/23 UBE x4 min forward only, L1 Supine Rt and Lt flexion 90 deg- rhythmic stabilization Seated BUE ER red TB 2x10 reps Supine BUE horizontal abduction green TB 2x10 reps Supine BUE flexion yellow TB 2x10 reps Supine scaption yellow TB 2x10 reps  Pt unable to complete seated active shoulder abd without increase in pain Pulley A/AROM abduction- cuing pt to adjust assist provided by other arm to put more challenge on the working UE x2 min each side   04/30/23 UBE x 5 min fwd only level 1 3 way scapular stabilization with blue loop Bilateral shoulder ER with red tband x 20 Bilateral shoulder ER with red tband x 20 Bilateral shoulder extension x 20 with red tband  Bilateral shoulder rows x 20 with red tband Prone over box (24"side) Individual W, I, T, T, Y x 10 each with no weight bilateral shoulders Doorway stretch for pecs x 10 hold 10 sec Ice to bilateral shoulders in long sitting with bolster under knees x 10 min  04/28/23 UBE x 6 min fwd only level 1 Manual STM to bilateral biceps along with IASTM x 15 min each UE, Passive elbow extension with biceps STM, then proceeded with TP DN below  Trigger Point Dry-Needling  Treatment instructions: Expect mild to moderate muscle soreness. S/S of pneumothorax if dry needled over a lung field, and to seek immediate medical attention should they occur. Patient verbalized understanding of these  instructions and education. Patient Consent Given: Yes Education handout provided: Yes Muscles treated: bilateral biceps  Electrical stimulation performed: No Parameters: N/A Treatment response/outcome: Skilled palpation used to identify taut bands and trigger points.  Once identified, dry needling techniques used to treat these areas.  Twitch response ellicited along with palpable elongation of muscle.  Following treatment, patient reports significant relief of tension in the muscle and decreased overall pain in the biceps and upper traps along with decreased neck pain.     04/22/23 UBE x 6 min fwd only level 1  Reviewed HEP and modifications, discussed protocol restrictions and instructed patient to avoid overloading the biceps.   Trigger Point Dry-Needling  Treatment instructions: Expect mild to moderate muscle soreness. S/S of pneumothorax if dry needled over a lung field, and to seek immediate medical attention should they occur. Patient verbalized understanding of these instructions and education. Patient Consent Given: Yes Education handout provided: Yes Muscles treated: bilateral upper traps in prone and supine, bilateral biceps  Electrical stimulation performed: No Parameters: N/A Treatment response/outcome: Skilled palpation used to identify taut bands and trigger points.  Once identified, dry needling techniques used to treat these areas.  Twitch response ellicited along with palpable elongation of muscle.  Following treatment, patient reports significant relief of tension in the muscle and decreased overall pain in the biceps and upper traps along with decreased neck pain.     PATIENT EDUCATION: Education details: Pain control, HEP Person educated: Patient Education method: Explanation, Demonstration, and Verbal cues Education comprehension: verbalized understanding and returned demonstration  HOME EXERCISE PROGRAM: Access Code: [email or web address] URL:  [email or web address] Date: 05/05/2023 Prepared by: [facility] - Outpatient Rehab - [facility]  Exercises - Standing Shoulder Internal Rotation Stretch with Dowel  - 1 x daily - 7 x weekly - 3 sets - 10 reps - Standing Shoulder Internal Rotation AAROM with Pulley  - 1 x daily - 7 x weekly - 3 sets - 10 reps - Supine Shoulder Flexion with Anchored Resistance  - 1 x daily - 7 x weekly - 2 sets - 10 reps - Seated Shoulder Abduction AAROM with Pulley Behind  - 1 x daily - 7 x weekly - 2 sets - 20 reps  ASSESSMENT:  CLINICAL IMPRESSION: Pt arrives having fell since his last appointment. He was unable to recall specifics of his fall, but noted that his Lt shoulder is more sore than usual. There doesn't appear to be any change in A/ROM of the Lt shoulder. PT monitored pain response during the session, but did have to frequently ask pt about pain response, due to him wanting to work through the pain. PT also educated pt on appropriate levels of pain with exercise. Some progressions were made to rotator cuff strengthening and HEP was updated to reflect this. Will monitor pt's response to new resistance exercises and make adjustments at his next appointment if needed.   OBJECTIVE IMPAIRMENTS: decreased mobility, decreased ROM, decreased strength, hypomobility, increased fascial restrictions, increased muscle spasms, impaired UE functional use, postural dysfunction, and pain.   ACTIVITY LIMITATIONS: carrying, lifting, transfers, bed mobility, bathing, toileting, dressing, reach over head, and hygiene/grooming  PARTICIPATION LIMITATIONS: meal prep, cleaning, laundry, driving, shopping, community activity, occupation, and yard work  PERSONAL FACTORS: Fitness, Past/current experiences, and 1-2 comorbidities: anxiety, depression, PTSD  are also affecting patient's functional outcome.   REHAB POTENTIAL: Good  CLINICAL DECISION MAKING: Stable/uncomplicated  EVALUATION  COMPLEXITY: Low  GOALS: Goals reviewed with patient? Yes  SHORT TERM GOALS: Target date: 03/09/2023   Pain report to be no greater than 4/10  Baseline: Goal status: MET 03/19/23  2.  Patient will be independent with initial HEP  Baseline:  Goal status: MET 03/04/23  3.  ROM to improve to protocol limits Baseline:  Goal status: MET 02/23/23  LONG TERM GOALS: Target date: 04/06/2023    Patient to report pain no greater than 2/10  Baseline:  Goal status: MET 03/17/23  2.  Patient to be independent with advanced HEP  Baseline:  Goal status: INITIAL  3.  Patient to  report 50% improvement in overall symptoms and use of left UE Baseline:  Goal status: INITIAL  4.  FOTO to be 67 Baseline: 41 Goal status: INITIAL  5.  ROM to be [facility] Baseline:  Goal status: INITIAL  6.  Strength to be 4 to 4+/5 all motions of left shoulder Baseline:  Goal status: INITIAL  PLAN: PT FREQUENCY:  3 times per week for first 3-4 weeks then 2 times per week.   Updated 03/04/23 patient is ahead of protocol, decreasing frequency to 1 time per week until patient is 6 weeks post op.    PT DURATION: 8 weeks  PLANNED INTERVENTIONS: Therapeutic exercises, Therapeutic activity, Neuromuscular re-education, Balance training, Gait training, Patient/Family education, Self Care, Joint mobilization, Aquatic Therapy, Dry Needling, Electrical stimulation, Cryotherapy, Moist heat, scar mobilization, Taping, Vasopneumatic device, Traction, Ultrasound, Ionotophoresis 4mg /ml Dexamethasone, Manual therapy, and Re-evaluation  PLAN FOR NEXT SESSION: AROM and RTC strengthening progression per protocol, and progress 12 week post op activities      6:55 AM,05/05/23 Donita Brooks PT, DPT [facility] [place]  [phone]

## 2023-05-05 ENCOUNTER — Encounter: Payer: Self-pay | Admitting: Physical Therapy

## 2023-05-07 ENCOUNTER — Ambulatory Visit: Payer: Medicare Other | Attending: Orthopedic Surgery | Admitting: Physical Therapy

## 2023-05-07 DIAGNOSIS — M25612 Stiffness of left shoulder, not elsewhere classified: Secondary | ICD-10-CM | POA: Diagnosis present

## 2023-05-07 DIAGNOSIS — R293 Abnormal posture: Secondary | ICD-10-CM | POA: Insufficient documentation

## 2023-05-07 DIAGNOSIS — M25511 Pain in right shoulder: Secondary | ICD-10-CM | POA: Insufficient documentation

## 2023-05-07 DIAGNOSIS — M25512 Pain in left shoulder: Secondary | ICD-10-CM | POA: Diagnosis present

## 2023-05-07 DIAGNOSIS — M6281 Muscle weakness (generalized): Secondary | ICD-10-CM | POA: Insufficient documentation

## 2023-05-07 DIAGNOSIS — R252 Cramp and spasm: Secondary | ICD-10-CM | POA: Diagnosis present

## 2023-05-07 NOTE — Therapy (Signed)
OUTPATIENT PHYSICAL THERAPY UPPER EXTREMITY TREATMENT NOTE   Patient Name: Tom White MRN: [id number] DOB:November 17, 1960, 62 y.o., male Today's Date: 05/07/2023  END OF SESSION:  PT End of Session - 05/07/23 0952     Visit Number 18    Date for PT Re-Evaluation 06/01/23    Authorization Type [employer] MEDICARE    Progress Note Due on Visit 20    PT Start Time 0950   20 min late   PT Stop Time 1015    PT Time Calculation (min) 25 min    Activity Tolerance Patient tolerated treatment well               Past Medical History:  Diagnosis Date   Arthritis    "had it in my left ankle" (05/04/2017)   Environmental and seasonal allergies 06/07/2015   Fibrosis of subtalar joint, 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   Mild cognitive impairment of uncertain or unknown etiology 09/11/2021   Mixed hyperlipidemia 06/14/2016   Pain in left ankle and joints of left foot 06/17/2017   Pain in rectum    Pneumonia 2017   PTSD (post-traumatic stress disorder) 01/30/2020   Slow transit constipation 04/02/2021   Urinary incontinence    Past Surgical History:  Procedure Laterality Date   ANKLE ARTHROSCOPY Left ~ 2013   "scraped out arthritis"   ANKLE ARTHROSCOPY WITH FUSION Left 09/22/2017   Procedure: LEFT ANKLE POSTERIOR ARTHROSCOPIC SUBTALAR ARTHRODESIS;  Surgeon: Nadara Mustard, MD;  Location: [facility] OR;  Service: Orthopedics;  Laterality: Left;   ANKLE FRACTURE SURGERY Left 12/1980   APPLICATION OF WOUND VAC Right 05/03/2017   foot   APPLICATION OF WOUND VAC Right 05/03/2017   Procedure:  APPLICATION OF WOUND VAC;  Surgeon: Eldred Manges, MD;  Location: [facility] OR;  Service: Orthopedics;  Laterality: Right;   FRACTURE SURGERY     I & D EXTREMITY Right 05/03/2017   foot   I & D EXTREMITY Right 05/03/2017   Procedure: IRRIGATION AND DEBRIDEMENT EXTREMITY RIGHT, REPAIR OF POSTERIOR TIBIAL TENDON;  Surgeon: Eldred Manges, MD;  Location: [facility] OR;  Service: Orthopedics;  Laterality: Right;   LAPAROSCOPIC CHOLECYSTECTOMY  1996?   POSTERIOR TIBIAL TENDON REPAIR Right 05/03/2017   SUBTALAR JOINT ARTHROEREISIS Left 1985?   TONSILLECTOMY     Patient Active Problem List   Diagnosis Date Noted   GERD (gastroesophageal reflux disease) 09/11/2021   Mild cognitive impairment of uncertain or unknown etiology 09/11/2021   Urinary incontinence    Pain in rectum    Major depressive disorder    History of concussion    Malignant melanoma of skin of back 04/16/2021   Slow transit constipation 04/02/2021   PTSD (post-traumatic stress disorder) 01/30/2020   Pain in left ankle and joints of left foot 06/17/2017   Fibrosis of subtalar joint, left 01/21/2017   Mixed hyperlipidemia 06/14/2016   Environmental and seasonal allergies 06/07/2015   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   Generalized anxiety disorder 06/07/2015    PCP: Eartha Inch, MD  REFERRING PROVIDER: Francena Hanly, MD  REFERRING DIAG: [phone] (ICD-10-CM) - Impingement syndrome of left shoulder  THERAPY DIAG:  Acute pain of left shoulder  Stiffness of left shoulder, not elsewhere classified  Acute pain of right shoulder  Muscle weakness (generalized)  Rationale for Evaluation and Treatment: Rehabilitation  ONSET DATE: 01/26/2023  SUBJECTIVE:                                                                                                                                                                                      SUBJECTIVE STATEMENT: Pt states he was really sore after last visit.  It was a  pretty hefty workout.   Hand dominance: Ambidextrous  PERTINENT HISTORY: na  PAIN:  8/2: Are you having pain? Left 3/10, right 4-5/10     PRECAUTIONS: Other: no active motion and limit elbow extension past neutral: see protocol  WEIGHT BEARING RESTRICTIONS: No as of 04/20/23  FALLS:  Has patient fallen in last 6 months? No  LIVING ENVIRONMENT: Lives with: lives with their spouse Lives in: House/apartment  OCCUPATION: desk  PLOF: Independent, Independent with basic ADLs, Independent with household mobility without device, Independent with community mobility without device, Independent with homemaking with ambulation, Independent with gait, and Independent with transfers  PATIENT GOALS: He hopes to have full use of his arm and be able to resume his prior level of function.  For right shoulder, he would like to resolve pain and avoid surgery on right shoulder.  NEXT MD VISIT: 16 weeks post op  OBJECTIVE:   DIAGNOSTIC FINDINGS:  na  PATIENT SURVEYS :  Initial eval: FOTO 40, goal is 52  04/20/23 FOTO : 49, goal is 7  Special Tests:  Pos speeds test on right 04/20/23  COGNITION: Overall cognitive status: Within functional limits for tasks assessed     SENSATION: WFL  POSTURE: Rounded shoulders  UPPER EXTREMITY ROM:   Passive ROM Right eval Left eval Right 04/20/23 Left 04/20/23  Shoulder flexion  90 155 167  Shoulder extension      Shoulder abduction  70 148 148  Shoulder adduction      Shoulder internal rotation  30 60 65  Shoulder external rotation  30 WNL WNL  Elbow flexion  90    Elbow extension  90    (Blank rows = not tested)  UPPER EXTREMITY MMT:  Deferred on initial eval  MMT Right eval Right  04/20/23 Left eval Left 04/20/23  Shoulder flexion  4    Shoulder extension      Shoulder abduction  4    Shoulder adduction      Shoulder internal rotation  4  Shoulder external rotation  4-    Middle trapezius      Lower trapezius      Elbow  flexion      Elbow extension      Wrist flexion      Wrist extension      Wrist ulnar deviation      Wrist radial deviation      Wrist pronation      Wrist supination      Grip strength (lbs)      (Blank rows = not tested)    TODAY'S TREATMENT:                                                                                                                                         DATE:  8/2: UBE x4 min forward only, L1 Supine clocks at 90 degrees 12:00/6:00 Right/Left 2#  Supine clocks at 90 degrees 9:00/3:00 Right/Left 2# Sidelying external rotation 2# right/left Supine 2# flexion, bend elbow to come back down less painful 10x right/left  Sidelying shoulder abduction 5x right/left Sidelying with shoulder abducted to 90 degrees 12:00/6:00 2# and 9:00/3:00 2# 10x each right/left (clunk right shoulder) Supine green band diagonal extension PNF pattern 10x right/left Supine yellow band diagonal flexion/scaption PNF pattern 10x right/left Bil UE wall slides with lift off at top 10x (cue to avoid lumbar extension)      05/04/23 UBE x4 min forward only, L1 Supine Rt and Lt flexion 90 deg- rhythmic stabilization Seated BUE ER red TB 2x10 reps Supine BUE horizontal abduction green TB 2x10 reps Supine BUE flexion yellow TB 2x10 reps Supine scaption yellow TB 2x10 reps  Pt unable to complete seated active shoulder abd without increase in pain Pulley A/AROM abduction- cuing pt to adjust assist provided by other arm to put more challenge on the working UE x2 min each side   04/30/23 UBE x 5 min fwd only level 1 3 way scapular stabilization with blue loop Bilateral shoulder ER with red tband x 20 Bilateral shoulder ER with red tband x 20 Bilateral shoulder extension x 20 with red tband  Bilateral shoulder rows x 20 with red tband Prone over box (24"side) Individual W, I, T, T, Y x 10 each with no weight bilateral shoulders Doorway stretch for pecs x 10 hold 10 sec Ice to bilateral  shoulders in long sitting with bolster under knees x 10 min  04/28/23 UBE x 6 min fwd only level 1 Manual STM to bilateral biceps along with IASTM x 15 min each UE, Passive elbow extension with biceps STM, then proceeded with TP DN below  Trigger Point Dry-Needling  Treatment instructions: Expect mild to moderate muscle soreness. S/S of pneumothorax if dry needled over a lung field, and to seek immediate medical attention should they occur. Patient verbalized understanding of these instructions and education. Patient Consent Given: Yes Education handout provided: Yes Muscles treated:  bilateral biceps  Electrical stimulation performed: No Parameters: N/A Treatment response/outcome: Skilled palpation used to identify taut bands and trigger points.  Once identified, dry needling techniques used to treat these areas.  Twitch response ellicited along with palpable elongation of muscle.  Following treatment, patient reports significant relief of tension in the muscle and decreased overall pain in the biceps and upper traps along with decreased neck pain.    PATIENT EDUCATION: Education details: Pain control, HEP Person educated: Patient Education method: Explanation, Demonstration, and Verbal cues Education comprehension: verbalized understanding and returned demonstration  HOME EXERCISE PROGRAM: Access Code: [email or web address] URL: [email or web address] Date: 05/05/2023 Prepared by: [facility] - [facility]  Exercises - Standing Shoulder Internal Rotation Stretch with Dowel  - 1 x daily - 7 x weekly - 3 sets - 10 reps - Standing Shoulder Internal Rotation AAROM with Pulley  - 1 x daily - 7 x weekly - 3 sets - 10 reps - Supine Shoulder Flexion with Anchored Resistance  - 1 x daily - 7 x weekly - 2 sets - 10 reps - Seated Shoulder Abduction AAROM with Pulley Behind  - 1 x daily - 7 x weekly - 2 sets - 20 reps  ASSESSMENT:  CLINICAL IMPRESSION: Treatment  focus on rotator cuff strengthening and stabilization.  Performed mostly in gravity assisted position (supine) to lower intensity since he felt last session was "hefty". The patient reports he thought the workout was challenging today but not overly painful.  Verbal cues to limit compensatory strategies and for modifications to lessen pain (bent elbow lower from supine flexion.)    OBJECTIVE IMPAIRMENTS: decreased mobility, decreased ROM, decreased strength, hypomobility, increased fascial restrictions, increased muscle spasms, impaired UE functional use, postural dysfunction, and pain.   ACTIVITY LIMITATIONS: carrying, lifting, transfers, bed mobility, bathing, toileting, dressing, reach over head, and hygiene/grooming  PARTICIPATION LIMITATIONS: meal prep, cleaning, laundry, driving, shopping, community activity, occupation, and yard work  PERSONAL FACTORS: Fitness, Past/current experiences, and 1-2 comorbidities: anxiety, depression, PTSD  are also affecting patient's functional outcome.   REHAB POTENTIAL: Good  CLINICAL DECISION MAKING: Stable/uncomplicated  EVALUATION COMPLEXITY: Low  GOALS: Goals reviewed with patient? Yes  SHORT TERM GOALS: Target date: 03/09/2023   Pain report to be no greater than 4/10  Baseline: Goal status: MET 03/19/23  2.  Patient will be independent with initial HEP  Baseline:  Goal status: MET 03/04/23  3.  ROM to improve to protocol limits Baseline:  Goal status: MET 02/23/23  LONG TERM GOALS: Target date: 06/01/2023    Patient to report pain no greater than 2/10  Baseline:  Goal status: MET 03/17/23  2.  Patient to be independent with advanced HEP  Baseline:  Goal status: INITIAL  3.  Patient to report 50% improvement in overall symptoms and use of left UE Baseline:  Goal status: INITIAL  4.  FOTO to be 67 Baseline: 41 Goal status: INITIAL  5.  ROM to be [facility] Baseline:  Goal status: INITIAL  6.  Strength to be 4 to 4+/5 all motions  of left shoulder Baseline:  Goal status: INITIAL  PLAN: PT FREQUENCY:  3 times per week for first 3-4 weeks then 2 times per week.   Updated 03/04/23 patient is ahead of protocol, decreasing frequency to 1 time per week until patient is 6 weeks post op.    PT DURATION: 8 weeks  PLANNED INTERVENTIONS: Therapeutic exercises, Therapeutic activity, Neuromuscular re-education, Balance training, Gait training, Patient/Family education, Self Care,  Joint mobilization, Aquatic Therapy, Dry Needling, Electrical stimulation, Cryotherapy, Moist heat, scar mobilization, Taping, Vasopneumatic device, Traction, Ultrasound, Ionotophoresis 4mg /ml Dexamethasone, Manual therapy, and Re-evaluation  PLAN FOR NEXT SESSION: AROM and RTC strengthening progression per protocol   Lavinia Sharps, PT 05/07/23 11:58 AM Phone: [phone] Fax: [phone]

## 2023-05-11 ENCOUNTER — Ambulatory Visit: Payer: Medicare Other | Admitting: Physical Therapy

## 2023-05-11 ENCOUNTER — Ambulatory Visit: Payer: Medicare Other

## 2023-05-11 DIAGNOSIS — M25612 Stiffness of left shoulder, not elsewhere classified: Secondary | ICD-10-CM

## 2023-05-11 DIAGNOSIS — M6281 Muscle weakness (generalized): Secondary | ICD-10-CM

## 2023-05-11 DIAGNOSIS — M25512 Pain in left shoulder: Secondary | ICD-10-CM

## 2023-05-11 DIAGNOSIS — M25511 Pain in right shoulder: Secondary | ICD-10-CM

## 2023-05-11 DIAGNOSIS — R293 Abnormal posture: Secondary | ICD-10-CM

## 2023-05-11 DIAGNOSIS — R252 Cramp and spasm: Secondary | ICD-10-CM

## 2023-05-11 NOTE — Therapy (Signed)
OUTPATIENT PHYSICAL THERAPY UPPER EXTREMITY TREATMENT NOTE   Patient Name: Brandall Luong MRN: [id number] DOB:02-Apr-1961, 62 y.o., male Today's Date: 05/11/2023  END OF SESSION:  PT End of Session - 05/11/23 1100     Visit Number 19    Date for PT Re-Evaluation 06/01/23    Authorization Type [employer] MEDICARE    Progress Note Due on Visit 20    PT Start Time 1050    PT Stop Time 1139    PT Time Calculation (min) 49 min    Activity Tolerance Patient tolerated treatment well    Behavior During Therapy WFL for tasks assessed/performed               Past Medical History:  Diagnosis Date   Arthritis    "had it in my left ankle" (05/04/2017)   Environmental and seasonal allergies 06/07/2015   Fibrosis of subtalar joint, 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   Mild cognitive impairment of uncertain or unknown etiology 09/11/2021   Mixed hyperlipidemia 06/14/2016   Pain in left ankle and joints of left foot 06/17/2017   Pain in rectum    Pneumonia 2017   PTSD (post-traumatic stress disorder) 01/30/2020   Slow transit constipation 04/02/2021   Urinary incontinence    Past Surgical History:  Procedure Laterality Date   ANKLE ARTHROSCOPY Left ~ 2013   "scraped out arthritis"   ANKLE ARTHROSCOPY WITH FUSION Left 09/22/2017   Procedure: LEFT ANKLE POSTERIOR ARTHROSCOPIC SUBTALAR ARTHRODESIS;  Surgeon: Nadara Mustard, MD;  Location: [facility] OR;  Service: Orthopedics;  Laterality: Left;   ANKLE FRACTURE SURGERY Left 12/1980   APPLICATION OF WOUND VAC Right 05/03/2017   foot   APPLICATION  OF WOUND VAC Right 05/03/2017   Procedure: APPLICATION OF WOUND VAC;  Surgeon: Eldred Manges, MD;  Location: [facility] OR;  Service: Orthopedics;  Laterality: Right;   FRACTURE SURGERY     I & D EXTREMITY Right 05/03/2017   foot   I & D EXTREMITY Right 05/03/2017   Procedure: IRRIGATION AND DEBRIDEMENT EXTREMITY RIGHT, REPAIR OF POSTERIOR TIBIAL TENDON;  Surgeon: Eldred Manges, MD;  Location: [facility] OR;  Service: Orthopedics;  Laterality: Right;   LAPAROSCOPIC CHOLECYSTECTOMY  1996?   POSTERIOR TIBIAL TENDON REPAIR Right 05/03/2017   SUBTALAR JOINT ARTHROEREISIS Left 1985?   TONSILLECTOMY     Patient Active Problem List   Diagnosis Date Noted   GERD (gastroesophageal reflux disease) 09/11/2021   Mild cognitive impairment of uncertain or unknown etiology 09/11/2021   Urinary incontinence    Pain in rectum    Major depressive disorder    History of concussion    Malignant melanoma of skin of back 04/16/2021   Slow transit constipation 04/02/2021   PTSD (post-traumatic stress disorder) 01/30/2020   Pain in left ankle and joints of left foot 06/17/2017   Fibrosis of subtalar joint, left 01/21/2017   Mixed hyperlipidemia 06/14/2016   Environmental and seasonal allergies 06/07/2015   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   Generalized anxiety disorder 06/07/2015    PCP:  Eartha Inch, MD   REFERRING PROVIDER: Francena Hanly, MD  REFERRING DIAG: [id number] (ICD-10-CM) - Impingement syndrome of left shoulder  THERAPY DIAG:  Acute pain of left shoulder  Stiffness of left shoulder, not elsewhere classified  Acute pain of right shoulder  Muscle weakness (generalized)  Cramp and spasm  Abnormal posture  Rationale for Evaluation and Treatment: Rehabilitation  ONSET DATE: 01/26/2023  SUBJECTIVE:                                                                                                                                                                                       SUBJECTIVE STATEMENT: Pt states he did fine after last visit.  Biceps pain is minimal.  Hand dominance: Ambidextrous  PERTINENT HISTORY: na  PAIN:  8/2: Are you having pain? Left 3/10, right 4-5/10     PRECAUTIONS: Other: no active motion and limit elbow extension past neutral: see protocol  WEIGHT BEARING RESTRICTIONS: No as of 04/20/23  FALLS:  Has patient fallen in last 6 months? No  LIVING ENVIRONMENT: Lives with: lives with their spouse Lives in: House/apartment  OCCUPATION: desk  PLOF: Independent, Independent with basic ADLs, Independent with household mobility without device, Independent with community mobility without device, Independent with homemaking with ambulation, Independent with gait, and Independent with transfers  PATIENT GOALS: He hopes to have full use of his arm and be able to resume his prior level of function.  For right shoulder, he would like to resolve pain and avoid surgery on right shoulder.  NEXT MD VISIT: 16 weeks post op  OBJECTIVE:   DIAGNOSTIC FINDINGS:  na  PATIENT SURVEYS :  Initial eval: FOTO 40, goal is 65  04/20/23 FOTO : 49, goal is 17  Special Tests:  Pos speeds test on right 04/20/23  COGNITION: Overall cognitive status: Within functional limits for tasks assessed     SENSATION: WFL  POSTURE: Rounded shoulders  UPPER EXTREMITY ROM:   Passive ROM Right eval Left eval Right 04/20/23 Left 04/20/23  Shoulder flexion  90 155 167  Shoulder extension      Shoulder abduction  70 148 148  Shoulder adduction      Shoulder internal rotation  30 60 65  Shoulder external rotation  30 WNL WNL  Elbow flexion  90    Elbow extension  90    (Blank rows = not tested)  UPPER EXTREMITY MMT:  Deferred on initial eval  MMT Right eval Right  04/20/23 Left eval Left 04/20/23  Shoulder flexion  4    Shoulder extension      Shoulder abduction  4    Shoulder adduction  Shoulder internal rotation  4    Shoulder  external rotation  4-    Middle trapezius      Lower trapezius      Elbow flexion      Elbow extension      Wrist flexion      Wrist extension      Wrist ulnar deviation      Wrist radial deviation      Wrist pronation      Wrist supination      Grip strength (lbs)      (Blank rows = not tested)    TODAY'S TREATMENT:                                                                                                                                         DATE:  05/11/23 UBE x 5 min fwd only level 1 3 way scapular stabilization with blue loop both Bilateral shoulder ER with red tband x 20 4 D ball rolls with red plyo ball x 20 each with slightly bent elbow both Bilateral shoulder extension x 20 with green tband  Bilateral shoulder rows x 20 with green tband Prone over box (24"side) x 20 each of drop and catch with red plyo ball Prone over box (24"side) Individual W, I, T, T, Y 2 x 10 each with 1lb bilateral shoulders Video created to show patient the atrophy surrounding the right scapular area.   Doorway stretch for pecs x 10 hold 10 sec Ice to bilateral shoulders in long sitting with bolster under knees x 10 min  8/2: UBE x4 min forward only, L1 Supine clocks at 90 degrees 12:00/6:00 Right/Left 2#  Supine clocks at 90 degrees 9:00/3:00 Right/Left 2# Sidelying external rotation 2# right/left Supine 2# flexion, bend elbow to come back down less painful 10x right/left  Sidelying shoulder abduction 5x right/left Sidelying with shoulder abducted to 90 degrees 12:00/6:00 2# and 9:00/3:00 2# 10x each right/left (clunk right shoulder) Supine green band diagonal extension PNF pattern 10x right/left Supine yellow band diagonal flexion/scaption PNF pattern 10x right/left Bil UE wall slides with lift off at top 10x (cue to avoid lumbar extension)  05/04/23 UBE x4 min forward only, L1 Supine Rt and Lt flexion 90 deg- rhythmic stabilization Seated BUE ER red TB 2x10 reps Supine BUE  horizontal abduction green TB 2x10 reps Supine BUE flexion yellow TB 2x10 reps Supine scaption yellow TB 2x10 reps  Pt unable to complete seated active shoulder abd without increase in pain Pulley A/AROM abduction- cuing pt to adjust assist provided by other arm to put more challenge on the working UE x2 min each side PATIENT EDUCATION: Education details: Pain control, HEP Person educated: Patient Education method: Programmer, multimedia, Facilities manager, and Verbal cues Education comprehension: verbalized understanding and returned demonstration  HOME EXERCISE PROGRAM: Access Code: [email or web address] URL: [email or web address] Date: 05/11/2023 Prepared by: Mikey Kirschner  Exercises - Standing Shoulder  Internal Rotation Stretch with Dowel  - 1 x daily - 7 x weekly - 3 sets - 10 reps - Standing Shoulder Internal Rotation AAROM with Pulley  - 1 x daily - 7 x weekly - 3 sets - 10 reps - Supine Shoulder Flexion with Anchored Resistance  - 1 x daily - 7 x weekly - 2 sets - 10 reps - Seated Shoulder Abduction AAROM with Pulley Behind  - 1 x daily - 7 x weekly - 2 sets - 20 reps - Prone on Swiss Ball Shoulder Row to ER to Overhead Reach w/o weight  - 1 x daily - 7 x weekly - 2 sets - 10 reps - Prone Shoulder Extension on Swiss Ball with Dumbbells  - 1 x daily - 7 x weekly - 2 sets - 10 reps - Prone Shoulder Horizontal Abduction with Thumbs Up  - 1 x daily - 7 x weekly - 2 sets - 10 reps - Prone Lower Trapezius with Legs Straight on Swiss Ball  - 1 x daily - 7 x weekly - 2 sets - 10 reps  ASSESSMENT:  CLINICAL IMPRESSION: Rosanne Ashing was able to tolerate 1 lb increase on WITTY's.  He has noticeable scapular weakness especially on the right.  He was able to complete all reps without rest, however.  He is having less biceps pain and is compliant with his HEP.  We added WITTY for home, printouts provided.  He would benefit from continued skilled PT for post op RCR protocol and right shoulder strengthening and  stabilization.    OBJECTIVE IMPAIRMENTS: decreased mobility, decreased ROM, decreased strength, hypomobility, increased fascial restrictions, increased muscle spasms, impaired UE functional use, postural dysfunction, and pain.   ACTIVITY LIMITATIONS: carrying, lifting, transfers, bed mobility, bathing, toileting, dressing, reach over head, and hygiene/grooming  PARTICIPATION LIMITATIONS: meal prep, cleaning, laundry, driving, shopping, community activity, occupation, and yard work  PERSONAL FACTORS: Fitness, Past/current experiences, and 1-2 comorbidities: anxiety, depression, PTSD  are also affecting patient's functional outcome.   REHAB POTENTIAL: Good  CLINICAL DECISION MAKING: Stable/uncomplicated  EVALUATION COMPLEXITY: Low  GOALS: Goals reviewed with patient? Yes  SHORT TERM GOALS: Target date: 03/09/2023   Pain report to be no greater than 4/10  Baseline: Goal status: MET 03/19/23  2.  Patient will be independent with initial HEP  Baseline:  Goal status: MET 03/04/23  3.  ROM to improve to protocol limits Baseline:  Goal status: MET 02/23/23  LONG TERM GOALS: Target date: 06/01/2023    Patient to report pain no greater than 2/10  Baseline:  Goal status: MET 03/17/23  2.  Patient to be independent with advanced HEP  Baseline:  Goal status: INITIAL  3.  Patient to report 50% improvement in overall symptoms and use of left UE Baseline:  Goal status: IN PROGRESS  4.  FOTO to be 67 Baseline: 41 Goal status: IN PROGRESS  5.  ROM to be [facility] Baseline:  Goal status: IN PROGRESS  6.  Strength to be 4 to 4+/5 all motions of left shoulder Baseline:  Goal status: IN PROGRESS  PLAN: PT FREQUENCY:  3 times per week for first 3-4 weeks then 2 times per week.   Updated 03/04/23 patient is ahead of protocol, decreasing frequency to 1 time per week until patient is 6 weeks post op.    PT DURATION: 8 weeks  PLANNED INTERVENTIONS: Therapeutic exercises, Therapeutic  activity, Neuromuscular re-education, Balance training, Gait training, Patient/Family education, Self Care, Joint mobilization, Aquatic Therapy, Dry Needling,  Electrical stimulation, Cryotherapy, Moist heat, scar mobilization, Taping, Vasopneumatic device, Traction, Ultrasound, Ionotophoresis 4mg /ml Dexamethasone, Manual therapy, and Re-evaluation  PLAN FOR NEXT SESSION: Focus on scapular stabilization and keep bicep loading minimal.     B. , PT 05/11/23 11:44 AM [facility] [address] [place], [place] [address] Phone # [phone] Fax [phone]

## 2023-05-12 NOTE — Therapy (Incomplete)
OUTPATIENT PHYSICAL THERAPY UPPER EXTREMITY TREATMENT NOTE PROGRESS NOTE  Reporting Period 03/23/23 to 05/13/23   See note below for Objective Data and Assessment of Progress/Goals.    Patient Name: Tom White MRN: [id number] DOB:[birth year], 62 y.o., male Today's Date: 05/12/2023  END OF SESSION:      Past Medical History:  Diagnosis Date   Arthritis    "had it in my left ankle" (05/04/2017)   Environmental and seasonal allergies 06/07/2015   Fibrosis of subtalar joint, 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   Mild cognitive impairment of uncertain or unknown etiology 09/11/2021   Mixed hyperlipidemia 06/14/2016   Pain in left ankle and joints of left foot 06/17/2017   Pain in rectum    Pneumonia 2017   PTSD (post-traumatic stress disorder) 01/30/2020   Slow transit constipation 04/02/2021   Urinary incontinence    Past Surgical History:  Procedure Laterality Date   ANKLE ARTHROSCOPY Left ~ 2013   "scraped out arthritis"   ANKLE ARTHROSCOPY WITH FUSION Left 09/22/2017   Procedure: LEFT ANKLE POSTERIOR ARTHROSCOPIC SUBTALAR ARTHRODESIS;  Surgeon: Nadara Mustard, MD;  Location: [facility] OR;  Service: Orthopedics;  Laterality: Left;   ANKLE FRACTURE SURGERY Left 12/1980   APPLICATION OF WOUND VAC Right 05/03/2017   foot   APPLICATION OF WOUND VAC Right 05/03/2017   Procedure: APPLICATION OF WOUND VAC;  Surgeon: Eldred Manges, MD;  Location: [facility] OR;  Service: Orthopedics;  Laterality: Right;   FRACTURE SURGERY     I & D EXTREMITY Right 05/03/2017   foot   I & D EXTREMITY Right  05/03/2017   Procedure: IRRIGATION AND DEBRIDEMENT EXTREMITY RIGHT, REPAIR OF POSTERIOR TIBIAL TENDON;  Surgeon: Eldred Manges, MD;  Location: [facility] OR;  Service: Orthopedics;  Laterality: Right;   LAPAROSCOPIC CHOLECYSTECTOMY  1996?   POSTERIOR TIBIAL TENDON REPAIR Right 05/03/2017   SUBTALAR JOINT ARTHROEREISIS Left 1985?   TONSILLECTOMY     Patient Active Problem List   Diagnosis Date Noted   GERD (gastroesophageal reflux disease) 09/11/2021   Mild cognitive impairment of uncertain or unknown etiology 09/11/2021   Urinary incontinence    Pain in rectum    Major depressive disorder    History of concussion    Malignant melanoma of skin of back 04/16/2021   Slow transit constipation 04/02/2021   PTSD (post-traumatic stress disorder) 01/30/2020   Pain in left ankle and joints of left foot 06/17/2017   Fibrosis of subtalar joint, left 01/21/2017   Mixed hyperlipidemia 06/14/2016   Environmental and seasonal allergies 06/07/2015   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   Generalized anxiety disorder 06/07/2015    PCP: Eartha Inch, MD   REFERRING PROVIDER: Francena Hanly, MD  REFERRING DIAG: M75.42 (ICD-10-CM) - Impingement syndrome of left shoulder  THERAPY DIAG:  No diagnosis found.  Rationale for Evaluation and Treatment: Rehabilitation  ONSET DATE: 01/26/2023  SUBJECTIVE:  SUBJECTIVE STATEMENT: *** Hand dominance: Ambidextrous  PERTINENT HISTORY: na  PAIN: *** 8/2: Are you having pain? Left 3/10, right 4-5/10     PRECAUTIONS: Other: no active motion and limit elbow extension past neutral: see protocol  WEIGHT BEARING RESTRICTIONS: No as of 04/20/23  FALLS:  Has patient fallen in last 6 months? No  LIVING ENVIRONMENT: Lives with: lives with their spouse Lives in:  House/apartment  OCCUPATION: desk  PLOF: Independent, Independent with basic ADLs, Independent with household mobility without device, Independent with community mobility without device, Independent with homemaking with ambulation, Independent with gait, and Independent with transfers  PATIENT GOALS: He hopes to have full use of his arm and be able to resume his prior level of function.  For right shoulder, he would like to resolve pain and avoid surgery on right shoulder.  NEXT MD VISIT: 16 weeks post op  OBJECTIVE:   DIAGNOSTIC FINDINGS:  na  PATIENT SURVEYS :  Initial eval: FOTO 40, goal is 55  04/20/23 FOTO : 49, goal is 67  05/13/23 FOTO ***  Special Tests:  Pos speeds test on right 04/20/23  COGNITION: Overall cognitive status: Within functional limits for tasks assessed     SENSATION: WFL  POSTURE: Rounded shoulders  UPPER EXTREMITY ROM:   Passive ROM Right eval Left eval Right 04/20/23 Left 04/20/23 Left 05/13/23  Shoulder flexion  90 155 167   Shoulder extension       Shoulder abduction  70 148 148   Shoulder adduction       Shoulder internal rotation  30 60 65   Shoulder external rotation  30 WNL WNL   Elbow flexion  90     Elbow extension  90     (Blank rows = not tested)  UPPER EXTREMITY MMT:  Deferred on initial eval  MMT Right eval Right  04/20/23 Left eval Left 05/13/23  Shoulder flexion  4    Shoulder extension      Shoulder abduction  4    Shoulder adduction      Shoulder internal rotation  4    Shoulder external rotation  4-    Middle trapezius      Lower trapezius      Elbow flexion      Elbow extension      Wrist flexion      Wrist extension      Wrist ulnar deviation      Wrist radial deviation      Wrist pronation      Wrist supination      Grip strength (lbs)      (Blank rows = not tested)    TODAY'S TREATMENT:                                                                                                                                          DATE:  05/13/23 UBE  x 5 min fwd only level 1 ROM/MMT/FOTO 3 way scapular stabilization with blue loop both Bilateral shoulder ER with red tband x 20 4 D ball rolls with red plyo ball x 20 each with slightly bent elbow both Bilateral shoulder extension x 20 with green tband  Bilateral shoulder rows x 20 with green tband Prone over box (24"side) x 20 each of drop and catch with red plyo ball Prone over box (24"side) Individual W, I, T, T, Y 2 x 10 each with 1lb bilateral shoulders Doorway stretch for pecs x 10 hold 10 sec Ice to bilateral shoulders in long sitting with bolster under knees x 10 min  05/11/23 UBE x 5 min fwd only level 1 3 way scapular stabilization with blue loop both Bilateral shoulder ER with red tband x 20 4 D ball rolls with red plyo ball x 20 each with slightly bent elbow both Bilateral shoulder extension x 20 with green tband  Bilateral shoulder rows x 20 with green tband Prone over box (24"side) x 20 each of drop and catch with red plyo ball Prone over box (24"side) Individual W, I, T, T, Y 2 x 10 each with 1lb bilateral shoulders Video created to show patient the atrophy surrounding the right scapular area.   Doorway stretch for pecs x 10 hold 10 sec Ice to bilateral shoulders in long sitting with bolster under knees x 10 min  8/2: UBE x4 min forward only, L1 Supine clocks at 90 degrees 12:00/6:00 Right/Left 2#  Supine clocks at 90 degrees 9:00/3:00 Right/Left 2# Sidelying external rotation 2# right/left Supine 2# flexion, bend elbow to come back down less painful 10x right/left  Sidelying shoulder abduction 5x right/left Sidelying with shoulder abducted to 90 degrees 12:00/6:00 2# and 9:00/3:00 2# 10x each right/left (clunk right shoulder) Supine green band diagonal extension PNF pattern 10x right/left Supine yellow band diagonal flexion/scaption PNF pattern 10x right/left Bil UE wall slides with lift off at top 10x (cue to avoid lumbar  extension)  05/04/23 UBE x4 min forward only, L1 Supine Rt and Lt flexion 90 deg- rhythmic stabilization Seated BUE ER red TB 2x10 reps Supine BUE horizontal abduction green TB 2x10 reps Supine BUE flexion yellow TB 2x10 reps Supine scaption yellow TB 2x10 reps  Pt unable to complete seated active shoulder abd without increase in pain Pulley A/AROM abduction- cuing pt to adjust assist provided by other arm to put more challenge on the working UE x2 min each side PATIENT EDUCATION: Education details: Pain control, HEP Person educated: Patient Education method: Programmer, multimedia, Facilities manager, and Verbal cues Education comprehension: verbalized understanding and returned demonstration  HOME EXERCISE PROGRAM: Access Code: [email or web address] URL: [email or web address] Date: 05/11/2023 Prepared by: Mikey Kirschner  Exercises - Standing Shoulder Internal Rotation Stretch with Dowel  - 1 x daily - 7 x weekly - 3 sets - 10 reps - Standing Shoulder Internal Rotation AAROM with Pulley  - 1 x daily - 7 x weekly - 3 sets - 10 reps - Supine Shoulder Flexion with Anchored Resistance  - 1 x daily - 7 x weekly - 2 sets - 10 reps - Seated Shoulder Abduction AAROM with Pulley Behind  - 1 x daily - 7 x weekly - 2 sets - 20 reps - Prone on Swiss Ball Shoulder Row to ER to Overhead Reach w/o weight  - 1 x daily - 7 x weekly - 2 sets - 10 reps - Prone Shoulder Extension on Swiss Ball with Dumbbells  - 1 x daily -  7 x weekly - 2 sets - 10 reps - Prone Shoulder Horizontal Abduction with Thumbs Up  - 1 x daily - 7 x weekly - 2 sets - 10 reps - Prone Lower Trapezius with Legs Straight on Swiss Ball  - 1 x daily - 7 x weekly - 2 sets - 10 reps  ASSESSMENT:  CLINICAL IMPRESSION: ***   OBJECTIVE IMPAIRMENTS: decreased mobility, decreased ROM, decreased strength, hypomobility, increased fascial restrictions, increased muscle spasms, impaired UE functional use, postural dysfunction, and pain.   ACTIVITY  LIMITATIONS: carrying, lifting, transfers, bed mobility, bathing, toileting, dressing, reach over head, and hygiene/grooming  PARTICIPATION LIMITATIONS: meal prep, cleaning, laundry, driving, shopping, community activity, occupation, and yard work  PERSONAL FACTORS: Fitness, Past/current experiences, and 1-2 comorbidities: anxiety, depression, PTSD  are also affecting patient's functional outcome.   REHAB POTENTIAL: Good  CLINICAL DECISION MAKING: Stable/uncomplicated  EVALUATION COMPLEXITY: Low  GOALS: Goals reviewed with patient? Yes  SHORT TERM GOALS: Target date: 03/09/2023   Pain report to be no greater than 4/10  Baseline: Goal status: MET 03/19/23  2.  Patient will be independent with initial HEP  Baseline:  Goal status: MET 03/04/23  3.  ROM to improve to protocol limits Baseline:  Goal status: MET 02/23/23  LONG TERM GOALS: Target date: 06/01/2023    Patient to report pain no greater than 2/10  Baseline:  Goal status: MET 03/17/23  2.  Patient to be independent with advanced HEP  Baseline:  Goal status: INITIAL  3.  Patient to report 50% improvement in overall symptoms and use of left UE Baseline:  Goal status: IN PROGRESS  4.  FOTO to be 67 Baseline: 41 Goal status: IN PROGRESS  5.  ROM to be [facility] Baseline:  Goal status: IN PROGRESS  6.  Strength to be 4 to 4+/5 all motions of left shoulder Baseline:  Goal status: IN PROGRESS  PLAN: PT FREQUENCY:  3 times per week for first 3-4 weeks then 2 times per week.   Updated 03/04/23 patient is ahead of protocol, decreasing frequency to 1 time per week until patient is 6 weeks post op.    PT DURATION: 8 weeks  PLANNED INTERVENTIONS: Therapeutic exercises, Therapeutic activity, Neuromuscular re-education, Balance training, Gait training, Patient/Family education, Self Care, Joint mobilization, Aquatic Therapy, Dry Needling, Electrical stimulation, Cryotherapy, Moist heat, scar mobilization, Taping, Vasopneumatic  device, Traction, Ultrasound, Ionotophoresis 4mg /ml Dexamethasone, Manual therapy, and Re-evaluation  PLAN FOR NEXT SESSION: Focus on scapular stabilization and keep bicep loading minimal.    Solon Palm, PT  05/12/23 8:57 PM [facility] [address], [address] [place], [place] [address] Phone # [phone] Fax [phone]

## 2023-05-13 ENCOUNTER — Ambulatory Visit: Payer: Medicare Other | Admitting: Physical Therapy

## 2023-05-13 DIAGNOSIS — M25511 Pain in right shoulder: Secondary | ICD-10-CM

## 2023-05-13 DIAGNOSIS — R252 Cramp and spasm: Secondary | ICD-10-CM

## 2023-05-13 DIAGNOSIS — M25512 Pain in left shoulder: Secondary | ICD-10-CM | POA: Diagnosis not present

## 2023-05-13 DIAGNOSIS — M6281 Muscle weakness (generalized): Secondary | ICD-10-CM

## 2023-05-13 DIAGNOSIS — M25612 Stiffness of left shoulder, not elsewhere classified: Secondary | ICD-10-CM

## 2023-05-13 DIAGNOSIS — R293 Abnormal posture: Secondary | ICD-10-CM

## 2023-05-13 NOTE — Therapy (Signed)
OUTPATIENT PHYSICAL THERAPY UPPER EXTREMITY TREATMENT NOTE   Patient Name: Meredith Hoese MRN: [id number] DOB:[birth year], 62 y.o., male Today's Date: 05/13/2023 Progress Note Reporting Period 03/23/23 to 05/13/23  See note below for Objective Data and Assessment of Progress/Goals.     END OF SESSION:  PT End of Session - 05/13/23 1134     Visit Number 20    Date for PT Re-Evaluation 06/01/23    Authorization Type [employer] MEDICARE    Progress Note Due on Visit 20    PT Start Time 1105    PT Stop Time 1153    PT Time Calculation (min) 48 min    Activity Tolerance Patient tolerated treatment well    Behavior During Therapy WFL for tasks assessed/performed                Past Medical History:  Diagnosis Date   Arthritis    "had it in my left ankle" (05/04/2017)   Environmental and seasonal allergies 06/07/2015   Fibrosis of subtalar joint, 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   Mild cognitive impairment of uncertain or unknown etiology 09/11/2021   Mixed hyperlipidemia 06/14/2016   Pain in left ankle and joints of left foot 06/17/2017   Pain in rectum    Pneumonia 2017   PTSD (post-traumatic stress disorder) 01/30/2020   Slow transit constipation 04/02/2021   Urinary incontinence    Past Surgical History:  Procedure Laterality Date   ANKLE ARTHROSCOPY Left ~ 2013   "scraped out arthritis"   ANKLE ARTHROSCOPY WITH FUSION Left 09/22/2017   Procedure: LEFT ANKLE POSTERIOR ARTHROSCOPIC SUBTALAR ARTHRODESIS;  Surgeon: Nadara Mustard, MD;  Location: [facility] OR;  Service: Orthopedics;   Laterality: Left;   ANKLE FRACTURE SURGERY Left 12/1980   APPLICATION OF WOUND VAC Right 05/03/2017   foot   APPLICATION OF WOUND VAC Right 05/03/2017   Procedure: APPLICATION OF WOUND VAC;  Surgeon: Eldred Manges, MD;  Location: [facility] OR;  Service: Orthopedics;  Laterality: Right;   FRACTURE SURGERY     I & D EXTREMITY Right 05/03/2017   foot   I & D EXTREMITY Right 05/03/2017   Procedure: IRRIGATION AND DEBRIDEMENT EXTREMITY RIGHT, REPAIR OF POSTERIOR TIBIAL TENDON;  Surgeon: Eldred Manges, MD;  Location: [facility] OR;  Service: Orthopedics;  Laterality: Right;   LAPAROSCOPIC CHOLECYSTECTOMY  1996?   POSTERIOR TIBIAL TENDON REPAIR Right 05/03/2017   SUBTALAR JOINT ARTHROEREISIS Left 1985?   TONSILLECTOMY     Patient Active Problem List   Diagnosis Date Noted   GERD (gastroesophageal reflux disease) 09/11/2021   Mild cognitive impairment of uncertain or unknown etiology 09/11/2021   Urinary incontinence    Pain in rectum    Major depressive disorder    History of concussion    Malignant melanoma of skin of back 04/16/2021   Slow transit constipation 04/02/2021   PTSD (post-traumatic stress disorder) 01/30/2020   Pain in left ankle and joints of left foot 06/17/2017   Fibrosis of subtalar joint, left 01/21/2017   Mixed hyperlipidemia 06/14/2016   Environmental and  seasonal allergies 06/07/2015   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   Generalized anxiety disorder 06/07/2015    PCP: Eartha Inch, MD   REFERRING PROVIDER: Francena Hanly, MD  REFERRING DIAG: [id number] (ICD-10-CM) - Impingement syndrome of left shoulder  THERAPY DIAG:  Acute pain of left shoulder  Stiffness of left shoulder, not elsewhere classified  Acute pain of right shoulder  Muscle weakness (generalized)  Abnormal posture  Cramp and spasm  Rationale for Evaluation and Treatment: Rehabilitation  ONSET DATE: 01/26/2023  SUBJECTIVE:                                                                                                                                                                                       SUBJECTIVE STATEMENT: Pt states he did fine after last visit.  Biceps pain is minimal.  Hand dominance: Ambidextrous  PERTINENT HISTORY: na  PAIN:  8/2: Are you having pain? Left 3/10, right 3-4/10     PRECAUTIONS: Other: no active motion and limit elbow extension past neutral: see protocol  WEIGHT BEARING RESTRICTIONS: No as of 04/20/23  FALLS:  Has patient fallen in last 6 months? No  LIVING ENVIRONMENT: Lives with: lives with their spouse Lives in: House/apartment  OCCUPATION: desk  PLOF: Independent, Independent with basic ADLs, Independent with household mobility without device, Independent with community mobility without device, Independent with homemaking with ambulation, Independent with gait, and Independent with transfers  PATIENT GOALS: He hopes to have full use of his arm and be able to resume his prior level of function.  For right shoulder, he would like to resolve pain and avoid surgery on right shoulder.  NEXT MD VISIT: 16 weeks post op  OBJECTIVE:   DIAGNOSTIC FINDINGS:  na  PATIENT SURVEYS :  Initial eval: FOTO 40, goal is 50  04/20/23 FOTO : 49, goal is 64  Special Tests:  Pos speeds test on right 04/20/23  COGNITION: Overall cognitive status: Within functional limits for tasks assessed     SENSATION: WFL  POSTURE: Rounded shoulders  UPPER EXTREMITY ROM:   Passive ROM Right eval Left eval Right 04/20/23 Left 04/20/23  Shoulder flexion  90 155 167  Shoulder extension      Shoulder abduction  70 148 148  Shoulder adduction      Shoulder internal rotation  30 60 65  Shoulder external rotation  30 WNL WNL  Elbow flexion  90    Elbow extension  90    (Blank rows = not tested)  UPPER EXTREMITY MMT:  Deferred on initial eval  MMT Right eval Right  04/20/23 Left eval Left 04/20/23  Shoulder flexion  4  Shoulder extension      Shoulder abduction  4    Shoulder adduction      Shoulder internal rotation  4    Shoulder external rotation  4-    Middle trapezius      Lower trapezius      Elbow flexion      Elbow extension      Wrist flexion      Wrist extension      Wrist ulnar deviation      Wrist radial deviation      Wrist pronation      Wrist supination      Grip strength (lbs)      (Blank rows = not tested)    TODAY'S TREATMENT:                                                                                                                                         DATE:  05/13/23 UBE x 5 min fwd only level 1.3 3 way scapular stabilization with blue loop both Bilateral shoulder ER with red tband x 20 4 D ball rolls with red plyo ball x 20 each with slightly bent elbow both Bilateral shoulder extension x 20 with green tband  Bilateral shoulder rows x 20 with green tband Prone over box (24"side) x 20 each of drop and catch with red plyo ball Prone over box (24"side) Individual W, I, T, T, Y 2 x 10 each with 1lb bilateral shoulders   Doorway stretch for pecs x 10 hold 10 sec Supine green band diagonal extension PNF pattern 2x10 right/left Ice to bilateral shoulders in long sitting with bolster under knees x 10 min 05/11/23 UBE x 5 min fwd only level 1 3 way scapular stabilization with blue loop both Bilateral shoulder ER with red tband x 20 4 D ball rolls with red plyo ball x 20 each with slightly bent elbow both Bilateral shoulder extension x 20 with green tband  Bilateral shoulder rows x 20 with green tband Prone over box (24"side) x 20 each of drop and catch with red plyo ball Prone over box (24"side) Individual W, I, T, T, Y 2 x 10 each with 1lb bilateral shoulders Video created to show patient the atrophy surrounding the right scapular area.   Doorway stretch for pecs x 10 hold 10 sec Ice to bilateral shoulders in long sitting with bolster under knees x 10 min  8/2: UBE  x4 min forward only, L1 Supine clocks at 90 degrees 12:00/6:00 Right/Left 2#  Supine clocks at 90 degrees 9:00/3:00 Right/Left 2# Sidelying external rotation 2# right/left Supine 2# flexion, bend elbow to come back down less painful 10x right/left  Sidelying shoulder abduction 5x right/left Sidelying with shoulder abducted to 90 degrees 12:00/6:00 2# and 9:00/3:00 2# 10x each right/left (clunk right shoulder) Supine green band diagonal extension PNF pattern 10x right/left Supine yellow band diagonal flexion/scaption  PNF pattern 10x right/left Bil UE wall slides with lift off at top 10x (cue to avoid lumbar extension)  PATIENT EDUCATION: Education details: Pain control, HEP Person educated: Patient Education method: Explanation, Demonstration, and Verbal cues Education comprehension: verbalized understanding and returned demonstration  HOME EXERCISE PROGRAM: Access Code: [email or web address] URL: [email or web address] Date: 05/11/2023 Prepared by: Mikey Kirschner  Exercises - Standing Shoulder Internal Rotation Stretch with Dowel  - 1 x daily - 7 x weekly - 3 sets - 10 reps - Standing Shoulder Internal Rotation AAROM with Pulley  - 1 x daily - 7 x weekly - 3 sets - 10 reps - Supine Shoulder Flexion with Anchored Resistance  - 1 x daily - 7 x weekly - 2 sets - 10 reps - Seated Shoulder Abduction AAROM with Pulley Behind  - 1 x daily - 7 x weekly - 2 sets - 20 reps - Prone on Swiss Ball Shoulder Row to ER to Overhead Reach w/o weight  - 1 x daily - 7 x weekly - 2 sets - 10 reps - Prone Shoulder Extension on Swiss Ball with Dumbbells  - 1 x daily - 7 x weekly - 2 sets - 10 reps - Prone Shoulder Horizontal Abduction with Thumbs Up  - 1 x daily - 7 x weekly - 2 sets - 10 reps - Prone Lower Trapezius with Legs Straight on Swiss Ball  - 1 x daily - 7 x weekly - 2 sets - 10 reps  ASSESSMENT:  CLINICAL IMPRESSION: Pt continues to report that he is appropriately challenged with current  level of exercise and is fatigued at the end of session.  He continues to have noticeable scapular weakness especially on the right.  He was able to complete all reps without rest, however.  Doing well with I, Y, W, T at home. He is challenged with reaching behind his back to wash himself and we discussed strategies to improve this at home along with discussion regarding shoulder dynamics.  He would benefit from continued skilled PT for post op RCR protocol and right shoulder strengthening and stabilization.    OBJECTIVE IMPAIRMENTS: decreased mobility, decreased ROM, decreased strength, hypomobility, increased fascial restrictions, increased muscle spasms, impaired UE functional use, postural dysfunction, and pain.   ACTIVITY LIMITATIONS: carrying, lifting, transfers, bed mobility, bathing, toileting, dressing, reach over head, and hygiene/grooming  PARTICIPATION LIMITATIONS: meal prep, cleaning, laundry, driving, shopping, community activity, occupation, and yard work  PERSONAL FACTORS: Fitness, Past/current experiences, and 1-2 comorbidities: anxiety, depression, PTSD  are also affecting patient's functional outcome.   REHAB POTENTIAL: Good  CLINICAL DECISION MAKING: Stable/uncomplicated  EVALUATION COMPLEXITY: Low  GOALS: Goals reviewed with patient? Yes  SHORT TERM GOALS: Target date: 03/09/2023   Pain report to be no greater than 4/10  Baseline: Goal status: MET 03/19/23  2.  Patient will be independent with initial HEP  Baseline:  Goal status: MET 03/04/23  3.  ROM to improve to protocol limits Baseline:  Goal status: MET 02/23/23  LONG TERM GOALS: Target date: 06/01/2023    Patient to report pain no greater than 2/10  Baseline:  Goal status: MET 03/17/23  2.  Patient to be independent with advanced HEP  Baseline:  Goal status: INITIAL  3.  Patient to report 50% improvement in overall symptoms and use of left UE Baseline:  Goal status: IN PROGRESS  4.  FOTO to be  67 Baseline: 49 (05/13/23) Goal status: IN PROGRESS  5.  ROM to be [facility] Baseline: limited in  functional reach behind the back (05/13/23) Goal status: IN PROGRESS  6.  Strength to be 4 to 4+/5 all motions of left shoulder Baseline:  Goal status: IN PROGRESS  PLAN: PT FREQUENCY:  3 times per week for first 3-4 weeks then 2 times per week.   Updated 03/04/23 patient is ahead of protocol, decreasing frequency to 1 time per week until patient is 6 weeks post op.    PT DURATION: 8 weeks  PLANNED INTERVENTIONS: Therapeutic exercises, Therapeutic activity, Neuromuscular re-education, Balance training, Gait training, Patient/Family education, Self Care, Joint mobilization, Aquatic Therapy, Dry Needling, Electrical stimulation, Cryotherapy, Moist heat, scar mobilization, Taping, Vasopneumatic device, Traction, Ultrasound, Ionotophoresis 4mg /ml Dexamethasone, Manual therapy, and Re-evaluation  PLAN FOR NEXT SESSION: Focus on scapular stabilization and keep bicep loading minimal. Endurance and flexibility.   Lorrene Reid, PT 05/13/23 11:44 AM  [facility] [address], [address] [place], [place] [address] Phone # [phone] Fax [phone]

## 2023-05-17 NOTE — Therapy (Signed)
OUTPATIENT PHYSICAL THERAPY UPPER EXTREMITY TREATMENT NOTE   Patient Name: Tom White MRN: [id number] DOB:1961-09-29, 62 y.o., male Today's Date: 05/18/2023     END OF SESSION:  PT End of Session - 05/18/23 0850     Visit Number 21    Date for PT Re-Evaluation 06/01/23    Authorization Type [employer] MEDICARE    Progress Note Due on Visit 30    PT Start Time 0850    PT Stop Time 0940   10 min ice post treatment   PT Time Calculation (min) 50 min    Activity Tolerance Patient tolerated treatment well    Behavior During Therapy [facility] for tasks assessed/performed                 Past Medical History:  Diagnosis Date   Arthritis    "had it in my left ankle" (05/04/2017)   Environmental and seasonal allergies 06/07/2015   Fibrosis of subtalar joint, 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   Mild cognitive impairment of uncertain or unknown etiology 09/11/2021   Mixed hyperlipidemia 06/14/2016   Pain in left ankle and joints of left foot 06/17/2017   Pain in rectum    Pneumonia 2017   PTSD (post-traumatic stress disorder) 01/30/2020   Slow transit constipation 04/02/2021   Urinary incontinence    Past Surgical History:  Procedure Laterality Date   ANKLE ARTHROSCOPY Left ~ 2013   "scraped out arthritis"   ANKLE ARTHROSCOPY WITH FUSION Left 09/22/2017   Procedure: LEFT ANKLE POSTERIOR ARTHROSCOPIC SUBTALAR ARTHRODESIS;  Surgeon: Nadara Mustard, MD;  Location: [facility] OR;  Service: Orthopedics;  Laterality: Left;   ANKLE FRACTURE SURGERY Left 12/1980   APPLICATION OF WOUND VAC  Right 05/03/2017   foot   APPLICATION OF WOUND VAC Right 05/03/2017   Procedure: APPLICATION OF WOUND VAC;  Surgeon: Eldred Manges, MD;  Location: [facility] OR;  Service: Orthopedics;  Laterality: Right;   FRACTURE SURGERY     I & D EXTREMITY Right 05/03/2017   foot   I & D EXTREMITY Right 05/03/2017   Procedure: IRRIGATION AND DEBRIDEMENT EXTREMITY RIGHT, REPAIR OF POSTERIOR TIBIAL TENDON;  Surgeon: Eldred Manges, MD;  Location: [facility] OR;  Service: Orthopedics;  Laterality: Right;   LAPAROSCOPIC CHOLECYSTECTOMY  1996?   POSTERIOR TIBIAL TENDON REPAIR Right 05/03/2017   SUBTALAR JOINT ARTHROEREISIS Left 1985?   TONSILLECTOMY     Patient Active Problem List   Diagnosis Date Noted   GERD (gastroesophageal reflux disease) 09/11/2021   Mild cognitive impairment of uncertain or unknown etiology 09/11/2021   Urinary incontinence    Pain in rectum    Major depressive disorder    History of concussion    Malignant melanoma of skin of back 04/16/2021   Slow transit constipation 04/02/2021   PTSD (post-traumatic stress disorder) 01/30/2020   Pain in left ankle and joints of left foot 06/17/2017   Fibrosis of subtalar joint, left 01/21/2017   Mixed hyperlipidemia 06/14/2016   Environmental and seasonal allergies 06/07/2015   HSV-2 (herpes simplex virus 2) infection  06/07/2015   Generalized anxiety disorder 06/07/2015    PCP: Eartha Inch, MD   REFERRING PROVIDER: Francena Hanly, MD  REFERRING DIAG: [phone] (ICD-10-CM) - Impingement syndrome of left shoulder  THERAPY DIAG:  Acute pain of left shoulder  Stiffness of left shoulder, not elsewhere classified  Acute pain of right shoulder  Muscle weakness (generalized)  Abnormal posture  Cramp and spasm  Rationale for Evaluation and Treatment: Rehabilitation  ONSET DATE: 01/26/2023  SUBJECTIVE:                                                                                                                                                                                       SUBJECTIVE STATEMENT: Left shoulder is getting better, R arm pain is going down.  Hand dominance: Ambidextrous  PERTINENT HISTORY: na  PAIN:  8/2: Are you having pain? Left 3/10, right 0 at rest, 4 with movement/10     PRECAUTIONS: Other: no active motion and limit elbow extension past neutral: see protocol  WEIGHT BEARING RESTRICTIONS: No as of 04/20/23  FALLS:  Has patient fallen in last 6 months? No  LIVING ENVIRONMENT: Lives with: lives with their spouse Lives in: House/apartment  OCCUPATION: desk  PLOF: Independent, Independent with basic ADLs, Independent with household mobility without device, Independent with community mobility without device, Independent with homemaking with ambulation, Independent with gait, and Independent with transfers  PATIENT GOALS: He hopes to have full use of his arm and be able to resume his prior level of function.  For right shoulder, he would like to resolve pain and avoid surgery on right shoulder.  NEXT MD VISIT: 16 weeks post op  OBJECTIVE:   DIAGNOSTIC FINDINGS:  na  PATIENT SURVEYS :  Initial eval: FOTO 40, goal is 40  04/20/23 FOTO : 49, goal is 40  Special Tests:  Pos speeds test on right 04/20/23  COGNITION: Overall cognitive status: Within functional limits for tasks assessed     SENSATION: WFL  POSTURE: Rounded shoulders  UPPER EXTREMITY ROM:   Passive ROM Right eval Left eval Right 04/20/23 Left 04/20/23  Shoulder flexion  90 155 167  Shoulder extension      Shoulder abduction  70 148 148  Shoulder adduction      Shoulder internal rotation  30 60 65  Shoulder external rotation  30 WNL WNL  Elbow flexion  90    Elbow extension  90    (Blank rows = not tested)  UPPER EXTREMITY MMT:  Deferred on initial eval  MMT Right eval Right  04/20/23 Left eval Left 04/20/23  Shoulder flexion  4    Shoulder extension  Shoulder abduction  4    Shoulder adduction       Shoulder internal rotation  4    Shoulder external rotation  4-    Middle trapezius      Lower trapezius      Elbow flexion      Elbow extension      Wrist flexion      Wrist extension      Wrist ulnar deviation      Wrist radial deviation      Wrist pronation      Wrist supination      Grip strength (lbs)      (Blank rows = not tested)    TODAY'S TREATMENT:                                                                                                                                         DATE:  05/18/23 UBE x 5 min fwd only level 1.3 3 way scapular stabilization with blue loop both x 10 B Bilateral shoulder ER with red tband x 20 4 D ball rolls with red plyo ball x 20 each with slightly bent elbow both Bilateral shoulder extension x 20 with green tband  Bilateral shoulder rows x 20 with green tband Prone over box (24"side) x 20 each of drop and catch with red plyo ball Prone over box (24"side) Individual W, I, T, T, Y 1x 10 each with 1lb bilateral shoulders : 2nd set done unilaterally L on table secondary to discomfort in left ant shoulder. Doorway stretch for pecs x 30 sec hold x 2 one with both at same time and then with just Left in a more comfortable position (no pain) Supine green band diagonal extension PNF pattern 2x10 right/left  Ice to bilateral shoulders in hooklying over bolster x 10 min  05/13/23 UBE x 5 min fwd only level 1.3 3 way scapular stabilization with blue loop both Bilateral shoulder ER with red tband x 20 4 D ball rolls with red plyo ball x 20 each with slightly bent elbow both Bilateral shoulder extension x 20 with green tband  Bilateral shoulder rows x 20 with green tband Prone over box (24"side) x 20 each of drop and catch with red plyo ball Prone over box (24"side) Individual W, I, T, T, Y 2 x 10 each with 1lb bilateral shoulders   Doorway stretch for pecs x 10 hold 10 sec Supine green band diagonal extension PNF pattern 2x10 right/left Ice to  bilateral shoulders in long sitting with bolster under knees x 10 min  05/11/23 UBE x 5 min fwd only level 1 3 way scapular stabilization with blue loop both Bilateral shoulder ER with red tband x 20 4 D ball rolls with red plyo ball x 20 each with slightly bent elbow both Bilateral shoulder extension x 20 with green tband  Bilateral shoulder rows x 20 with  green tband Prone over box (24"side) x 20 each of drop and catch with red plyo ball Prone over box (24"side) Individual W, I, T, T, Y 2 x 10 each with 1lb bilateral shoulders Video created to show patient the atrophy surrounding the right scapular area.   Doorway stretch for pecs x 10 hold 10 sec Ice to bilateral shoulders in long sitting with bolster under knees x 10 min  8/2: UBE x4 min forward only, L1 Supine clocks at 90 degrees 12:00/6:00 Right/Left 2#  Supine clocks at 90 degrees 9:00/3:00 Right/Left 2# Sidelying external rotation 2# right/left Supine 2# flexion, bend elbow to come back down less painful 10x right/left  Sidelying shoulder abduction 5x right/left Sidelying with shoulder abducted to 90 degrees 12:00/6:00 2# and 9:00/3:00 2# 10x each right/left (clunk right shoulder) Supine green band diagonal extension PNF pattern 10x right/left Supine yellow band diagonal flexion/scaption PNF pattern 10x right/left Bil UE wall slides with lift off at top 10x (cue to avoid lumbar extension)  PATIENT EDUCATION: Education details: Pain control, HEP Person educated: Patient Education method: Explanation, Demonstration, and Verbal cues Education comprehension: verbalized understanding and returned demonstration  HOME EXERCISE PROGRAM: Access Code: [email or web address] URL: [email or web address] Date: 05/11/2023 Prepared by: Mikey Kirschner  Exercises - Standing Shoulder Internal Rotation Stretch with Dowel  - 1 x daily - 7 x weekly - 3 sets - 10 reps - Standing Shoulder Internal Rotation AAROM with Pulley  - 1 x daily - 7  x weekly - 3 sets - 10 reps - Supine Shoulder Flexion with Anchored Resistance  - 1 x daily - 7 x weekly - 2 sets - 10 reps - Seated Shoulder Abduction AAROM with Pulley Behind  - 1 x daily - 7 x weekly - 2 sets - 20 reps - Prone on Swiss Ball Shoulder Row to ER to Overhead Reach w/o weight  - 1 x daily - 7 x weekly - 2 sets - 10 reps - Prone Shoulder Extension on Swiss Ball with Dumbbells  - 1 x daily - 7 x weekly - 2 sets - 10 reps - Prone Shoulder Horizontal Abduction with Thumbs Up  - 1 x daily - 7 x weekly - 2 sets - 10 reps - Prone Lower Trapezius with Legs Straight on Swiss Ball  - 1 x daily - 7 x weekly - 2 sets - 10 reps  ASSESSMENT:  CLINICAL IMPRESSION: Tom White reporting some pain with prone Y's and doorway stretches today, so we modified these to lessen pain. Otherwise, he was able to tolerate remaining TE without complaint. WITTY's most challenging at this point. Tom White continues to demonstrate potential for improvement and would benefit from continued skilled therapy to address impairments.     OBJECTIVE IMPAIRMENTS: decreased mobility, decreased ROM, decreased strength, hypomobility, increased fascial restrictions, increased muscle spasms, impaired UE functional use, postural dysfunction, and pain.   ACTIVITY LIMITATIONS: carrying, lifting, transfers, bed mobility, bathing, toileting, dressing, reach over head, and hygiene/grooming  PARTICIPATION LIMITATIONS: meal prep, cleaning, laundry, driving, shopping, community activity, occupation, and yard work  PERSONAL FACTORS: Fitness, Past/current experiences, and 1-2 comorbidities: anxiety, depression, PTSD  are also affecting patient's functional outcome.   REHAB POTENTIAL: Good  CLINICAL DECISION MAKING: Stable/uncomplicated  EVALUATION COMPLEXITY: Low  GOALS: Goals reviewed with patient? Yes  SHORT TERM GOALS: Target date: 03/09/2023   Pain report to be no greater than 4/10  Baseline: Goal status: MET 03/19/23  2.   Patient will be independent with initial HEP  Baseline:  Goal  status: MET 03/04/23  3.  ROM to improve to protocol limits Baseline:  Goal status: MET 02/23/23  LONG TERM GOALS: Target date: 06/01/2023    Patient to report pain no greater than 2/10  Baseline:  Goal status: MET 03/17/23  2.  Patient to be independent with advanced HEP  Baseline:  Goal status: INITIAL  3.  Patient to report 50% improvement in overall symptoms and use of left UE Baseline:  Goal status: IN PROGRESS  4.  FOTO to be 67 Baseline: 49 (05/13/23) Goal status: IN PROGRESS  5.  ROM to be [facility] Baseline: limited in functional reach behind the back (05/13/23) Goal status: IN PROGRESS  6.  Strength to be 4 to 4+/5 all motions of left shoulder Baseline:  Goal status: IN PROGRESS  PLAN: PT FREQUENCY:  3 times per week for first 3-4 weeks then 2 times per week.   Updated 03/04/23 patient is ahead of protocol, decreasing frequency to 1 time per week until patient is 6 weeks post op.    PT DURATION: 8 weeks  PLANNED INTERVENTIONS: Therapeutic exercises, Therapeutic activity, Neuromuscular re-education, Balance training, Gait training, Patient/Family education, Self Care, Joint mobilization, Aquatic Therapy, Dry Needling, Electrical stimulation, Cryotherapy, Moist heat, scar mobilization, Taping, Vasopneumatic device, Traction, Ultrasound, Ionotophoresis 4mg /ml Dexamethasone, Manual therapy, and Re-evaluation  PLAN FOR NEXT SESSION: Focus on scapular stabilization and keep bicep loading minimal. Endurance and flexibility.   Solon Palm, PT  05/18/23 1:36 PM  [facility] [address], [address] [place], [place] [address] Phone # [phone] Fax [phone]

## 2023-05-18 ENCOUNTER — Ambulatory Visit: Payer: Medicare Other | Admitting: Physical Therapy

## 2023-05-18 ENCOUNTER — Encounter: Payer: Self-pay | Admitting: Physical Therapy

## 2023-05-18 DIAGNOSIS — M25512 Pain in left shoulder: Secondary | ICD-10-CM

## 2023-05-18 DIAGNOSIS — R293 Abnormal posture: Secondary | ICD-10-CM

## 2023-05-18 DIAGNOSIS — M25511 Pain in right shoulder: Secondary | ICD-10-CM

## 2023-05-18 DIAGNOSIS — M25612 Stiffness of left shoulder, not elsewhere classified: Secondary | ICD-10-CM

## 2023-05-18 DIAGNOSIS — M6281 Muscle weakness (generalized): Secondary | ICD-10-CM

## 2023-05-18 DIAGNOSIS — R252 Cramp and spasm: Secondary | ICD-10-CM

## 2023-05-20 ENCOUNTER — Ambulatory Visit: Payer: Medicare Other | Admitting: Physical Therapy

## 2023-05-25 ENCOUNTER — Ambulatory Visit: Payer: Medicare Other

## 2023-05-25 DIAGNOSIS — M25512 Pain in left shoulder: Secondary | ICD-10-CM | POA: Diagnosis not present

## 2023-05-25 DIAGNOSIS — R293 Abnormal posture: Secondary | ICD-10-CM

## 2023-05-25 DIAGNOSIS — M25511 Pain in right shoulder: Secondary | ICD-10-CM

## 2023-05-25 DIAGNOSIS — M6281 Muscle weakness (generalized): Secondary | ICD-10-CM

## 2023-05-25 DIAGNOSIS — M25612 Stiffness of left shoulder, not elsewhere classified: Secondary | ICD-10-CM

## 2023-05-25 DIAGNOSIS — R252 Cramp and spasm: Secondary | ICD-10-CM

## 2023-05-25 NOTE — Therapy (Signed)
OUTPATIENT PHYSICAL THERAPY UPPER EXTREMITY TREATMENT NOTE   Patient Name: Tom White MRN: [id number] DOB:1961/09/02, 62 y.o., male Today's Date: 05/25/2023     END OF SESSION:  PT End of Session - 05/25/23 0941     Visit Number 22    Date for PT Re-Evaluation 06/01/23    Authorization Type [employer] MEDICARE    Progress Note Due on Visit 30    PT Start Time 0930    PT Stop Time 1020    PT Time Calculation (min) 50 min    Activity Tolerance Patient tolerated treatment well    Behavior During Therapy WFL for tasks assessed/performed                 Past Medical History:  Diagnosis Date   Arthritis    "had it in my left ankle" (05/04/2017)   Environmental and seasonal allergies 06/07/2015   Fibrosis of subtalar joint, 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   Mild cognitive impairment of uncertain or unknown etiology 09/11/2021   Mixed hyperlipidemia 06/14/2016   Pain in left ankle and joints of left foot 06/17/2017   Pain in rectum    Pneumonia 2017   PTSD (post-traumatic stress disorder) 01/30/2020   Slow transit constipation 04/02/2021   Urinary incontinence    Past Surgical History:  Procedure Laterality Date   ANKLE ARTHROSCOPY Left ~ 2013   "scraped out arthritis"   ANKLE ARTHROSCOPY WITH FUSION Left 09/22/2017   Procedure: LEFT ANKLE POSTERIOR ARTHROSCOPIC SUBTALAR ARTHRODESIS;  Surgeon: Nadara Mustard, MD;  Location: [facility];  Service: Orthopedics;  Laterality: Left;   ANKLE FRACTURE SURGERY Left 12/1980   APPLICATION OF WOUND VAC Right 05/03/2017   foot    APPLICATION OF WOUND VAC Right 05/03/2017   Procedure: APPLICATION OF WOUND VAC;  Surgeon: Eldred Manges, MD;  Location: [facility] OR;  Service: Orthopedics;  Laterality: Right;   FRACTURE SURGERY     I & D EXTREMITY Right 05/03/2017   foot   I & D EXTREMITY Right 05/03/2017   Procedure: IRRIGATION AND DEBRIDEMENT EXTREMITY RIGHT, REPAIR OF POSTERIOR TIBIAL TENDON;  Surgeon: Eldred Manges, MD;  Location: [facility] OR;  Service: Orthopedics;  Laterality: Right;   LAPAROSCOPIC CHOLECYSTECTOMY  1996?   POSTERIOR TIBIAL TENDON REPAIR Right 05/03/2017   SUBTALAR JOINT ARTHROEREISIS Left 1985?   TONSILLECTOMY     Patient Active Problem List   Diagnosis Date Noted   GERD (gastroesophageal reflux disease) 09/11/2021   Mild cognitive impairment of uncertain or unknown etiology 09/11/2021   Urinary incontinence    Pain in rectum    Major depressive disorder    History of concussion    Malignant melanoma of skin of back 04/16/2021   Slow transit constipation 04/02/2021   PTSD (post-traumatic stress disorder) 01/30/2020   Pain in left ankle and joints of left foot 06/17/2017   Fibrosis of subtalar joint, left 01/21/2017   Mixed hyperlipidemia 06/14/2016   Environmental and seasonal allergies 06/07/2015   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   Generalized anxiety disorder  06/07/2015    PCP: Eartha Inch, MD   REFERRING PROVIDER: Francena Hanly, MD  REFERRING DIAG: [phone] (ICD-10-CM) - Impingement syndrome of left shoulder  THERAPY DIAG:  Acute pain of left shoulder  Stiffness of left shoulder, not elsewhere classified  Acute pain of right shoulder  Muscle weakness (generalized)  Abnormal posture  Cramp and spasm  Rationale for Evaluation and Treatment: Rehabilitation  ONSET DATE: 01/26/2023  SUBJECTIVE:                                                                                                                                                                                       SUBJECTIVE STATEMENT: Patient had stopped by on Friday August 16th stating he was having "terrible pain" and wondering what he should do.  PT directed patient to see Dr. Rennis Chris for f/u.  Patient saw MD.  Per MD, no re-injury or problems of concern.  Per Dr. Rennis Chris, the next step for the right shoulder would be MRI to see if there is anything that would need surgery.  Patient states he doesn't want surgery.  Would rather continue rehab.  We discussed he may be reaching max benefit of PT soon as we have tried multiple avenues at resolving his symptoms.  He does continue to demonstrate scapular weakness which may be causing some of these compensatory symptoms.  We will also try adding eccentric biceps activity.   Patient has new order from MD for continuing rehab for both shoulders.    Hand dominance: Ambidextrous  PERTINENT HISTORY: na  PAIN:  8/2: Are you having pain? Left 3/10, right 0 at rest, 4 with movement/10     PRECAUTIONS: Other: no active motion and limit elbow extension past neutral: see protocol  WEIGHT BEARING RESTRICTIONS: No as of 04/20/23  FALLS:  Has patient fallen in last 6 months? No  LIVING ENVIRONMENT: Lives with: lives with their spouse Lives in: House/apartment  OCCUPATION: desk  PLOF: Independent, Independent with basic ADLs, Independent with household mobility without device, Independent with community mobility without device, Independent with homemaking with ambulation, Independent with gait, and Independent with transfers  PATIENT GOALS: He hopes to have full use of his arm and be able to resume his prior level of function.  For right shoulder, he would like to resolve pain and avoid surgery on right shoulder.  NEXT MD VISIT: 16 weeks post op  OBJECTIVE:   DIAGNOSTIC FINDINGS:  na  PATIENT SURVEYS :  Initial eval: FOTO 40, goal is 1  04/20/23 FOTO : 49, goal is 67  05/25/23: FOTO: 58, goal is 67  Special Tests:  Pos speeds test on right  04/20/23  COGNITION: Overall cognitive status: Within functional limits for tasks assessed     SENSATION: WFL  POSTURE: Rounded shoulders  UPPER EXTREMITY ROM:   Passive ROM Right eval Left eval Right 04/20/23 Left 04/20/23  Shoulder flexion  90 155 167  Shoulder extension      Shoulder abduction  70 148 148  Shoulder adduction      Shoulder internal rotation  30 60 65  Shoulder external rotation  30 WNL WNL  Elbow flexion  90    Elbow extension  90    (Blank rows = not tested)  UPPER EXTREMITY MMT:  Deferred on initial eval  MMT Right eval Right  04/20/23 Left eval Left 04/20/23  Shoulder flexion  4    Shoulder extension      Shoulder abduction  4    Shoulder adduction      Shoulder internal rotation  4    Shoulder external rotation  4-    Middle trapezius      Lower trapezius      Elbow flexion      Elbow extension      Wrist flexion      Wrist extension      Wrist ulnar deviation      Wrist radial deviation      Wrist pronation      Wrist supination      Grip strength (lbs)      (Blank rows = not tested)    TODAY'S TREATMENT:                                                                                                                                         DATE:  05/25/23 Discussion about patients progress, new order and DC plan. Patient also has new order from MD but forgot to bring it.  Spent several minutes getting wife to send this from home and trying to email that to [place].   UBE x 5 min fwd only level 1.3 3 way scapular stabilization with blue loop both x 10 B 4 D ball rolls with red plyo ball x 20 each with slightly bent elbow both Seated eccentric bicep curls with 3 lbs over box Prone over box (24"side) Individual W, I, T, T, Y 2 x 10 each with 1lb bilateral shoulders   05/18/23 UBE x 5 min fwd only level 1.3 3 way scapular stabilization with blue loop both x 10 B Bilateral shoulder ER with red tband x 20 4 D ball rolls with red plyo ball x  20 each with slightly bent elbow both Bilateral shoulder extension x 20 with green tband  Bilateral shoulder rows x 20 with green tband Prone over box (24"side) x 20 each of drop and catch with red plyo ball Prone over box (24"side) Individual W, I, T, T, Y 1x 10 each with 1lb bilateral shoulders : 2nd set done unilaterally L on  table secondary to discomfort in left ant shoulder. Doorway stretch for pecs x 30 sec hold x 2 one with both at same time and then with just Left in a more comfortable position (no pain) Supine green band diagonal extension PNF pattern 2x10 right/left  Ice to bilateral shoulders in hooklying over bolster x 10 min  05/13/23 UBE x 5 min fwd only level 1.3 3 way scapular stabilization with blue loop both Bilateral shoulder ER with red tband x 20 4 D ball rolls with red plyo ball x 20 each with slightly bent elbow both Bilateral shoulder extension x 20 with green tband  Bilateral shoulder rows x 20 with green tband Prone over box (24"side) x 20 each of drop and catch with red plyo ball Prone over box (24"side) Individual W, I, T, T, Y 2 x 10 each with 1lb bilateral shoulders   Doorway stretch for pecs x 10 hold 10 sec Supine green band diagonal extension PNF pattern 2x10 right/left Ice to bilateral shoulders in long sitting with bolster under knees x 10 min   PATIENT EDUCATION: Education details: Pain control, HEP Person educated: Patient Education method: Programmer, multimedia, Demonstration, and Verbal cues Education comprehension: verbalized understanding and returned demonstration  HOME EXERCISE PROGRAM: Access Code: [email or web address] URL: [email or web address] Date: 05/11/2023 Prepared by: Mikey Kirschner  Exercises - Standing Shoulder Internal Rotation Stretch with Dowel  - 1 x daily - 7 x weekly - 3 sets - 10 reps - Standing Shoulder Internal Rotation AAROM with Pulley  - 1 x daily - 7 x weekly - 3 sets - 10 reps - Supine Shoulder Flexion with Anchored  Resistance  - 1 x daily - 7 x weekly - 2 sets - 10 reps - Seated Shoulder Abduction AAROM with Pulley Behind  - 1 x daily - 7 x weekly - 2 sets - 20 reps - Prone on Swiss Ball Shoulder Row to ER to Overhead Reach w/o weight  - 1 x daily - 7 x weekly - 2 sets - 10 reps - Prone Shoulder Extension on Swiss Ball with Dumbbells  - 1 x daily - 7 x weekly - 2 sets - 10 reps - Prone Shoulder Horizontal Abduction with Thumbs Up  - 1 x daily - 7 x weekly - 2 sets - 10 reps - Prone Lower Trapezius with Legs Straight on Swiss Ball  - 1 x daily - 7 x weekly - 2 sets - 10 reps  ASSESSMENT:  CLINICAL IMPRESSION: Emerson continues to progress slowly.  He has intermittent set backs which cause [place] to have to scale back his rehab.  He has new order from MD to continue rehab for both shoulders.  We discussed that he may be reaching max potential if he continues to have these setbacks.  He was offered MRI on right shoulder but patient refused due to not wanting to do any surgery on the right.   Awad continues to demonstrate potential for improvement and would benefit from continued skilled therapy to address impairments.     OBJECTIVE IMPAIRMENTS: decreased mobility, decreased ROM, decreased strength, hypomobility, increased fascial restrictions, increased muscle spasms, impaired UE functional use, postural dysfunction, and pain.   ACTIVITY LIMITATIONS: carrying, lifting, transfers, bed mobility, bathing, toileting, dressing, reach over head, and hygiene/grooming  PARTICIPATION LIMITATIONS: meal prep, cleaning, laundry, driving, shopping, community activity, occupation, and yard work  PERSONAL FACTORS: Fitness, Past/current experiences, and 1-2 comorbidities: anxiety, depression, PTSD  are also affecting patient's functional outcome.   REHAB POTENTIAL: Good  CLINICAL DECISION MAKING: Stable/uncomplicated  EVALUATION COMPLEXITY: Low  GOALS: Goals reviewed with patient? Yes  SHORT TERM GOALS: Target date:  03/09/2023   Pain report to be no greater than 4/10  Baseline: Goal status: MET 03/19/23  2.  Patient will be independent with initial HEP  Baseline:  Goal status: MET 03/04/23  3.  ROM to improve to protocol limits Baseline:  Goal status: MET 02/23/23  LONG TERM GOALS: Target date: 06/01/2023    Patient to report pain no greater than 2/10  Baseline:  Goal status: MET 03/17/23  2.  Patient to be independent with advanced HEP  Baseline:  Goal status: INITIAL  3.  Patient to report 50% improvement in overall symptoms and use of left UE Baseline:  Goal status: IN PROGRESS  4.  FOTO to be 67 Baseline: 49 (05/13/23) Goal status: IN PROGRESS  5.  ROM to be [facility] Baseline: limited in functional reach behind the back (05/13/23) Goal status: IN PROGRESS  6.  Strength to be 4 to 4+/5 all motions of left shoulder Baseline:  Goal status: IN PROGRESS  PLAN: PT FREQUENCY:  3 times per week for first 3-4 weeks then 2 times per week.   Updated 03/04/23 patient is ahead of protocol, decreasing frequency to 1 time per week until patient is 6 weeks post op.    PT DURATION: 8 weeks  PLANNED INTERVENTIONS: Therapeutic exercises, Therapeutic activity, Neuromuscular re-education, Balance training, Gait training, Patient/Family education, Self Care, Joint mobilization, Aquatic Therapy, Dry Needling, Electrical stimulation, Cryotherapy, Moist heat, scar mobilization, Taping, Vasopneumatic device, Traction, Ultrasound, Ionotophoresis 4mg /ml Dexamethasone, Manual therapy, and Re-evaluation  PLAN FOR NEXT SESSION: Focus on scapular stabilization and keep bicep loading minimal. Endurance and flexibility.   Victorino Dike B. Jermichael Belmares, PT 05/25/23 12:04 PM   [facility] [address], [address] [place], [place] [address] Phone # [phone] Fax [phone]

## 2023-05-27 ENCOUNTER — Ambulatory Visit: Payer: Medicare Other

## 2023-05-27 DIAGNOSIS — R252 Cramp and spasm: Secondary | ICD-10-CM

## 2023-05-27 DIAGNOSIS — M25612 Stiffness of left shoulder, not elsewhere classified: Secondary | ICD-10-CM

## 2023-05-27 DIAGNOSIS — M25512 Pain in left shoulder: Secondary | ICD-10-CM

## 2023-05-27 DIAGNOSIS — R293 Abnormal posture: Secondary | ICD-10-CM

## 2023-05-27 DIAGNOSIS — M6281 Muscle weakness (generalized): Secondary | ICD-10-CM

## 2023-05-27 DIAGNOSIS — M25511 Pain in right shoulder: Secondary | ICD-10-CM

## 2023-05-27 NOTE — Therapy (Signed)
OUTPATIENT PHYSICAL THERAPY UPPER EXTREMITY TREATMENT NOTE   Patient Name: Baer Snay MRN: [id number] DOB:1960-10-19, 62 y.o., male Today's Date: 05/27/2023     END OF SESSION:  PT End of Session - 05/27/23 0929     Visit Number 23    Date for PT Re-Evaluation 06/01/23    Authorization Type [employer] MEDICARE    Progress Note Due on Visit 30    PT Start Time 0930    PT Stop Time 1020    PT Time Calculation (min) 50 min    Activity Tolerance Patient tolerated treatment well    Behavior During Therapy WFL for tasks assessed/performed                 Past Medical History:  Diagnosis Date   Arthritis    "had it in my left ankle" (05/04/2017)   Environmental and seasonal allergies 06/07/2015   Fibrosis of subtalar joint, 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   Mild cognitive impairment of uncertain or unknown etiology 09/11/2021   Mixed hyperlipidemia 06/14/2016   Pain in left ankle and joints of left foot 06/17/2017   Pain in rectum    Pneumonia 2017   PTSD (post-traumatic stress disorder) 01/30/2020   Slow transit constipation 04/02/2021   Urinary incontinence    Past Surgical History:  Procedure Laterality Date   ANKLE ARTHROSCOPY Left ~ 2013   "scraped out arthritis"   ANKLE ARTHROSCOPY WITH FUSION Left 09/22/2017   Procedure: LEFT ANKLE POSTERIOR ARTHROSCOPIC SUBTALAR ARTHRODESIS;  Surgeon: Nadara Mustard, MD;  Location: [facility] OR;  Service: Orthopedics;  Laterality: Left;   ANKLE FRACTURE SURGERY Left 12/1980   APPLICATION OF WOUND VAC Right 05/03/2017   foot    APPLICATION OF WOUND VAC Right 05/03/2017   Procedure: APPLICATION OF WOUND VAC;  Surgeon: Eldred Manges, MD;  Location: [facility] OR;  Service: Orthopedics;  Laterality: Right;   FRACTURE SURGERY     I & D EXTREMITY Right 05/03/2017   foot   I & D EXTREMITY Right 05/03/2017   Procedure: IRRIGATION AND DEBRIDEMENT EXTREMITY RIGHT, REPAIR OF POSTERIOR TIBIAL TENDON;  Surgeon: Eldred Manges, MD;  Location: [facility] OR;  Service: Orthopedics;  Laterality: Right;   LAPAROSCOPIC CHOLECYSTECTOMY  1996?   POSTERIOR TIBIAL TENDON REPAIR Right 05/03/2017   SUBTALAR JOINT ARTHROEREISIS Left 1985?   TONSILLECTOMY     Patient Active Problem List   Diagnosis Date Noted   GERD (gastroesophageal reflux disease) 09/11/2021   Mild cognitive impairment of uncertain or unknown etiology 09/11/2021   Urinary incontinence    Pain in rectum    Major depressive disorder    History of concussion    Malignant melanoma of skin of back 04/16/2021   Slow transit constipation 04/02/2021   PTSD (post-traumatic stress disorder) 01/30/2020   Pain in left ankle and joints of left foot 06/17/2017   Fibrosis of subtalar joint, left 01/21/2017   Mixed hyperlipidemia 06/14/2016   Environmental and seasonal allergies 06/07/2015   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   Generalized anxiety disorder  06/07/2015    PCP: Eartha Inch, MD   REFERRING PROVIDER: Francena Hanly, MD  REFERRING DIAG: [phone] (ICD-10-CM) - Impingement syndrome of left shoulder  THERAPY DIAG:  Acute pain of left shoulder  Stiffness of left shoulder, not elsewhere classified  Acute pain of right shoulder  Muscle weakness (generalized)  Cramp and spasm  Abnormal posture  Rationale for Evaluation and Treatment: Rehabilitation  ONSET DATE: 01/26/2023  SUBJECTIVE:                                                                                                                                                                                       SUBJECTIVE STATEMENT: Patient states, "I'm doing good today" No biceps pain.      Hand dominance: Ambidextrous  PERTINENT HISTORY: na  PAIN:  8/2: Are you having pain? Left 3/10, right 0 at rest, 4 with movement/10     PRECAUTIONS: Other: no active motion and limit elbow extension past neutral: see protocol  WEIGHT BEARING RESTRICTIONS: No as of 04/20/23  FALLS:  Has patient fallen in last 6 months? No  LIVING ENVIRONMENT: Lives with: lives with their spouse Lives in: House/apartment  OCCUPATION: desk  PLOF: Independent, Independent with basic ADLs, Independent with household mobility without device, Independent with community mobility without device, Independent with homemaking with ambulation, Independent with gait, and Independent with transfers  PATIENT GOALS: He hopes to have full use of his arm and be able to resume his prior level of function.  For right shoulder, he would like to resolve pain and avoid surgery on right shoulder.  NEXT MD VISIT: 16 weeks post op  OBJECTIVE:   DIAGNOSTIC FINDINGS:  na  PATIENT SURVEYS :  Initial eval: FOTO 40, goal is 40  04/20/23 FOTO : 49, goal is 67  05/25/23: FOTO: 58, goal is 67  Special Tests:  Pos speeds test on right 04/20/23  COGNITION: Overall cognitive status: Within functional limits for tasks assessed     SENSATION: WFL  POSTURE: Rounded shoulders  UPPER EXTREMITY ROM:   Passive ROM Right eval Left eval Right 04/20/23 Left 04/20/23  Shoulder flexion  90 155 167  Shoulder extension      Shoulder abduction  70 148 148  Shoulder adduction      Shoulder internal rotation  30 60 65  Shoulder external rotation  30 WNL WNL  Elbow flexion  90    Elbow extension  90    (Blank rows = not tested)  UPPER EXTREMITY MMT:  Deferred on initial eval  MMT Right eval Right  04/20/23 Left eval Left 04/20/23  Shoulder flexion  4    Shoulder extension  Shoulder abduction  4    Shoulder  adduction      Shoulder internal rotation  4    Shoulder external rotation  4-    Middle trapezius      Lower trapezius      Elbow flexion      Elbow extension      Wrist flexion      Wrist extension      Wrist ulnar deviation      Wrist radial deviation      Wrist pronation      Wrist supination      Grip strength (lbs)      (Blank rows = not tested)    TODAY'S TREATMENT:                                                                                                                                         DATE:  05/27/23 Nustep x 5 min fwd only level 1.3 Eccentric bicep curls with 3 lbs over edge of knees (patient states some mild discomfort in right distal biceps) Seated wrist flexion and extension stretching x 5 each direction holding 10 sec each both UE's 3 way scapular stabilization with blue loop both x 10 B 4 D ball rolls with red plyo ball x 20 each with slightly bent elbow both Doorway stretch 5 x 10 sec hold Prone over box (24"side) Individual W, I, T, T, Y  2 x 10 each with 1lb bilateral shoulders Fwd bent drop and catch x 20 each arm in horizontal abduction  Ice to bilateral shoulders x 10 min in hook lying  05/25/23 Discussion about patients progress, new order and DC plan. Patient also has new order from MD but forgot to bring it.  Spent several minutes getting wife to send this from home and trying to email that to [place].   UBE x 5 min fwd only level 1.3 3 way scapular stabilization with blue loop both x 10 B 4 D ball rolls with red plyo ball x 20 each with slightly bent elbow both Seated eccentric bicep curls with 3 lbs over box Prone over box (24"side) Individual W, I, T, T, Y 2 x 10 each with 1lb bilateral shoulders   05/18/23 UBE x 5 min fwd only level 1.3 3 way scapular stabilization with blue loop both x 10 B Bilateral shoulder ER with red tband x 20 4 D ball rolls with red plyo ball x 20 each with slightly bent elbow both Bilateral shoulder extension x 20  with green tband  Bilateral shoulder rows x 20 with green tband Prone over box (24"side) x 20 each of drop and catch with red plyo ball Prone over box (24"side) Individual W, I, T, T, Y 1x 10 each with 1lb bilateral shoulders : 2nd set done unilaterally L on table secondary to discomfort in left ant shoulder. Doorway stretch for pecs x  30 sec hold x 2 one with both at same time and then with just Left in a more comfortable position (no pain) Supine green band diagonal extension PNF pattern 2x10 right/left  Ice to bilateral shoulders in hooklying over bolster x 10 min   PATIENT EDUCATION: Education details: Pain control, HEP Person educated: Patient Education method: Programmer, multimedia, Demonstration, and Verbal cues Education comprehension: verbalized understanding and returned demonstration  HOME EXERCISE PROGRAM: Access Code: [email or web address] URL: [email or web address] Date: 05/11/2023 Prepared by: Mikey Kirschner  Exercises - Standing Shoulder Internal Rotation Stretch with Dowel  - 1 x daily - 7 x weekly - 3 sets - 10 reps - Standing Shoulder Internal Rotation AAROM with Pulley  - 1 x daily - 7 x weekly - 3 sets - 10 reps - Supine Shoulder Flexion with Anchored Resistance  - 1 x daily - 7 x weekly - 2 sets - 10 reps - Seated Shoulder Abduction AAROM with Pulley Behind  - 1 x daily - 7 x weekly - 2 sets - 20 reps - Prone on Swiss Ball Shoulder Row to ER to Overhead Reach w/o weight  - 1 x daily - 7 x weekly - 2 sets - 10 reps - Prone Shoulder Extension on Swiss Ball with Dumbbells  - 1 x daily - 7 x weekly - 2 sets - 10 reps - Prone Shoulder Horizontal Abduction with Thumbs Up  - 1 x daily - 7 x weekly - 2 sets - 10 reps - Prone Lower Trapezius with Legs Straight on Swiss Ball  - 1 x daily - 7 x weekly - 2 sets - 10 reps  ASSESSMENT:  CLINICAL IMPRESSION: Devren arrived with no pain today.  He was able to complete all tasks with no increase in pain.  He continues to struggle with  left scapular stability strength. We will continue to address this.  No biceps pain with bicep curls today.  He has one visit left for re-assessment.  We will assess need to continue or DC to HEP next visit.    OBJECTIVE IMPAIRMENTS: decreased mobility, decreased ROM, decreased strength, hypomobility, increased fascial restrictions, increased muscle spasms, impaired UE functional use, postural dysfunction, and pain.   ACTIVITY LIMITATIONS: carrying, lifting, transfers, bed mobility, bathing, toileting, dressing, reach over head, and hygiene/grooming  PARTICIPATION LIMITATIONS: meal prep, cleaning, laundry, driving, shopping, community activity, occupation, and yard work  PERSONAL FACTORS: Fitness, Past/current experiences, and 1-2 comorbidities: anxiety, depression, PTSD  are also affecting patient's functional outcome.   REHAB POTENTIAL: Good  CLINICAL DECISION MAKING: Stable/uncomplicated  EVALUATION COMPLEXITY: Low  GOALS: Goals reviewed with patient? Yes  SHORT TERM GOALS: Target date: 03/09/2023   Pain report to be no greater than 4/10  Baseline: Goal status: MET 03/19/23  2.  Patient will be independent with initial HEP  Baseline:  Goal status: MET 03/04/23  3.  ROM to improve to protocol limits Baseline:  Goal status: MET 02/23/23  LONG TERM GOALS: Target date: 06/01/2023    Patient to report pain no greater than 2/10  Baseline:  Goal status: MET 03/17/23  2.  Patient to be independent with advanced HEP  Baseline:  Goal status: INITIAL  3.  Patient to report 50% improvement in overall symptoms and use of left UE Baseline:  Goal status: IN PROGRESS  4.  FOTO to be 67 Baseline: 49 (05/13/23) Goal status: IN PROGRESS  5.  ROM to be [facility] Baseline: limited in functional reach behind the back (05/13/23) Goal status: IN PROGRESS  6.  Strength to be 4 to 4+/5 all motions of left shoulder Baseline:  Goal status: IN PROGRESS  PLAN: PT FREQUENCY:  3 times per week for  first 3-4 weeks then 2 times per week.   Updated 03/04/23 patient is ahead of protocol, decreasing frequency to 1 time per week until patient is 6 weeks post op.    PT DURATION: 8 weeks  PLANNED INTERVENTIONS: Therapeutic exercises, Therapeutic activity, Neuromuscular re-education, Balance training, Gait training, Patient/Family education, Self Care, Joint mobilization, Aquatic Therapy, Dry Needling, Electrical stimulation, Cryotherapy, Moist heat, scar mobilization, Taping, Vasopneumatic device, Traction, Ultrasound, Ionotophoresis 4mg /ml Dexamethasone, Manual therapy, and Re-evaluation  PLAN FOR NEXT SESSION: We will assess need to continue or DC to HEP next visit. Focus on scapular stabilization and keep bicep loading minimal. Endurance and flexibility.   Victorino Dike B. Tamari Busic, PT 05/27/23 11:51 AM  [facility] [address] [place], [place] [address] Phone # [phone] Fax [phone]

## 2023-06-01 ENCOUNTER — Ambulatory Visit: Payer: Medicare Other

## 2023-06-01 DIAGNOSIS — R293 Abnormal posture: Secondary | ICD-10-CM

## 2023-06-01 DIAGNOSIS — M25512 Pain in left shoulder: Secondary | ICD-10-CM | POA: Diagnosis not present

## 2023-06-01 DIAGNOSIS — R252 Cramp and spasm: Secondary | ICD-10-CM

## 2023-06-01 DIAGNOSIS — M25511 Pain in right shoulder: Secondary | ICD-10-CM

## 2023-06-01 DIAGNOSIS — M6281 Muscle weakness (generalized): Secondary | ICD-10-CM

## 2023-06-01 DIAGNOSIS — M25612 Stiffness of left shoulder, not elsewhere classified: Secondary | ICD-10-CM

## 2023-06-01 NOTE — Therapy (Signed)
OUTPATIENT PHYSICAL THERAPY UPPER EXTREMITY TREATMENT NOTE PHYSICAL THERAPY DISCHARGE SUMMARY  Visits from Start of Care: 24  Current functional level related to goals / functional outcomes: See below   Remaining deficits: See below   Education / Equipment: See below   Patient agrees to discharge. Patient goals were met. Patient is being discharged due to meeting the stated rehab goals.    Patient Name: Tom White MRN: [id number] DOB:[birth year], 62 y.o., male Today's Date: 06/01/2023     END OF SESSION:  PT End of Session - 06/01/23 1019     Visit Number 24    Date for PT Re-Evaluation 06/01/23    Authorization Type [employer] MEDICARE    PT Start Time 1018    PT Stop Time 1100    PT Time Calculation (min) 42 min    Activity Tolerance Patient tolerated treatment well    Behavior During Therapy WFL for tasks assessed/performed                 Past Medical History:  Diagnosis Date   Arthritis    "had it in my left ankle" (05/04/2017)   Environmental and seasonal allergies 06/07/2015   Fibrosis of subtalar joint, 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   Mild cognitive impairment of uncertain or unknown etiology 09/11/2021   Mixed hyperlipidemia 06/14/2016   Pain in left ankle and joints of left foot 06/17/2017   Pain in rectum    Pneumonia 2017   PTSD (post-traumatic stress disorder) 01/30/2020   Slow transit constipation 04/02/2021   Urinary incontinence    Past Surgical History:  Procedure Laterality Date   ANKLE ARTHROSCOPY Left ~ 2013   "scraped out  arthritis"   ANKLE ARTHROSCOPY WITH FUSION Left 09/22/2017   Procedure: LEFT ANKLE POSTERIOR ARTHROSCOPIC SUBTALAR ARTHRODESIS;  Surgeon: Nadara Mustard, MD;  Location: [facility] OR;  Service: Orthopedics;  Laterality: Left;   ANKLE FRACTURE SURGERY Left 12/1980   APPLICATION OF WOUND VAC Right 05/03/2017   foot   APPLICATION OF WOUND VAC Right 05/03/2017   Procedure: APPLICATION OF WOUND VAC;  Surgeon: Eldred Manges, MD;  Location: [facility] OR;  Service: Orthopedics;  Laterality: Right;   FRACTURE SURGERY     I & D EXTREMITY Right 05/03/2017   foot   I & D EXTREMITY Right 05/03/2017   Procedure: IRRIGATION AND DEBRIDEMENT EXTREMITY RIGHT, REPAIR OF POSTERIOR TIBIAL TENDON;  Surgeon: Eldred Manges, MD;  Location: [facility] OR;  Service: Orthopedics;  Laterality: Right;   LAPAROSCOPIC CHOLECYSTECTOMY  1996?   POSTERIOR TIBIAL TENDON REPAIR Right 05/03/2017   SUBTALAR JOINT ARTHROEREISIS Left 1985?   TONSILLECTOMY     Patient Active Problem List   Diagnosis Date Noted   GERD (gastroesophageal reflux disease) 09/11/2021   Mild cognitive impairment of uncertain or unknown etiology 09/11/2021   Urinary incontinence    Pain in rectum    Major depressive disorder    History of concussion    Malignant melanoma of skin of back 04/16/2021   Slow transit constipation 04/02/2021   PTSD (post-traumatic  stress disorder) 01/30/2020   Pain in left ankle and joints of left foot 06/17/2017   Fibrosis of subtalar joint, left 01/21/2017   Mixed hyperlipidemia 06/14/2016   Environmental and seasonal allergies 06/07/2015   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   Generalized anxiety disorder 06/07/2015    PCP: Eartha Inch, MD   REFERRING PROVIDER: Francena Hanly, MD  REFERRING DIAG: [phone] (ICD-10-CM) - Impingement syndrome of left shoulder  THERAPY DIAG:  Acute pain of left shoulder  Stiffness of left shoulder, not elsewhere classified  Acute pain of right shoulder  Muscle weakness  (generalized)  Cramp and spasm  Abnormal posture  Rationale for Evaluation and Treatment: Rehabilitation  ONSET DATE: 01/26/2023  SUBJECTIVE:                                                                                                                                                                                      SUBJECTIVE STATEMENT: Patient reports no issues since last visit.  Agrees with DC.       Hand dominance: Ambidextrous  PERTINENT HISTORY: na  PAIN:  8/27: Are you having pain? Left 2-4/10, right 1-2/10     PRECAUTIONS: Other: no active motion and limit elbow extension past neutral: see protocol  WEIGHT BEARING RESTRICTIONS: No as of 04/20/23  FALLS:  Has patient fallen in last 6 months? No  LIVING ENVIRONMENT: Lives with: lives with their spouse Lives in: House/apartment  OCCUPATION: desk  PLOF: Independent, Independent with basic ADLs, Independent with household mobility without device, Independent with community mobility without device, Independent with homemaking with ambulation, Independent with gait, and Independent with transfers  PATIENT GOALS: He hopes to have full use of his arm and be able to resume his prior level of function.  For right shoulder, he would like to resolve pain and avoid surgery on right shoulder.  NEXT MD VISIT: 16 weeks post op  OBJECTIVE:   DIAGNOSTIC FINDINGS:  na  PATIENT SURVEYS :  Initial eval: FOTO 40, goal is 43  04/20/23 FOTO : 49, goal is 67  05/25/23: FOTO: 58, goal is 67  06/01/23: FOTO: 54, goal is 67  Special Tests:  Pos speeds test on right 04/20/23  COGNITION: Overall cognitive status: Within functional limits for tasks assessed     SENSATION: WFL  POSTURE: Rounded shoulders  UPPER EXTREMITY ROM:   Passive ROM Right eval Left eval Right 04/20/23 Right 06/01/23 Left 04/20/23 Left 05/2723  Shoulder flexion  90 155 172 167 170  Shoulder extension        Shoulder abduction  70 148 170  148 160  Shoulder adduction  Shoulder internal rotation  30 60 70 65 80  Shoulder external rotation  30 WNL WNL WNL WNL  Elbow flexion  90      Elbow extension  90      (Blank rows = not tested)  UPPER EXTREMITY MMT:  Deferred on initial eval  MMT Right eval Right  04/20/23 Right  06/01/23 Left eval Left 04/20/23 Left  06/01/23  Shoulder flexion  4 4+   4+  Shoulder extension        Shoulder abduction  4 4+   4+  Shoulder adduction        Shoulder internal rotation  4 4+   4+  Shoulder external rotation  4- 4+   4+  Middle trapezius        Lower trapezius        Elbow flexion        Elbow extension        Wrist flexion        Wrist extension        Wrist ulnar deviation        Wrist radial deviation        Wrist pronation        Wrist supination        Grip strength (lbs)        (Blank rows = not tested)    TODAY'S TREATMENT:                                                                                                                                         DATE:  05/27/23 DC assessment completed Reviewed current updated HEP Provided updated written HEP and instructed patient on how to progress  05/27/23 Nustep x 5 min fwd only level 1.3 Eccentric bicep curls with 3 lbs over edge of knees (patient states some mild discomfort in right distal biceps) Seated wrist flexion and extension stretching x 5 each direction holding 10 sec each both UE's 3 way scapular stabilization with blue loop both x 10 B 4 D ball rolls with red plyo ball x 20 each with slightly bent elbow both Doorway stretch 5 x 10 sec hold Prone over box (24"side) Individual W, I, T, T, Y  2 x 10 each with 1lb bilateral shoulders Fwd bent drop and catch x 20 each arm in horizontal abduction  Ice to bilateral shoulders x 10 min in hook lying  05/25/23 Discussion about patients progress, new order and DC plan. Patient also has new order from MD but forgot to bring it.  Spent several minutes getting  wife to send this from home and trying to email that to [place].   UBE x 5 min fwd only level 1.3 3 way scapular stabilization with blue loop both x 10 B 4 D ball rolls with red plyo ball x 20 each with slightly bent elbow both Seated eccentric bicep curls with  3 lbs over box Prone over box (24"side) Individual W, I, T, T, Y 2 x 10 each with 1lb bilateral shoulders    PATIENT EDUCATION: Education details: Pain control, HEP Person educated: Patient Education method: Explanation, Demonstration, and Verbal cues Education comprehension: verbalized understanding and returned demonstration  HOME EXERCISE PROGRAM: Access Code: [email or web address] URL: [email or web address] Date: 06/01/2023 Prepared by: Mikey Kirschner  Exercises - Prone on Swiss Ball Shoulder Row to ER to Overhead Reach w/o weight  - 1 x daily - 7 x weekly - 2 sets - 10 reps - Prone Shoulder Extension on Swiss Ball with Dumbbells  - 1 x daily - 7 x weekly - 2 sets - 10 reps - Prone Shoulder Horizontal Abduction with Thumbs Up  - 1 x daily - 7 x weekly - 2 sets - 10 reps - Prone Lower Trapezius with Legs Straight on Swiss Ball  - 1 x daily - 7 x weekly - 2 sets - 10 reps - Prone Shoulder Extension - Single Arm  - 2 x daily - 7 x weekly - 2 sets - 10 reps - Prone Shoulder Row  - 2 x daily - 7 x weekly - 2 sets - 10 reps - Prone Single Arm Shoulder Horizontal Abduction with Scapular Retraction and Palm Down  - 2 x daily - 7 x weekly - 2 sets - 10 reps - Sidelying Shoulder External Rotation  - 2 x daily - 7 x weekly - 2 sets - 10 reps - Single Arm Serratus Punches in Supine with Dumbbell  - 2 x daily - 7 x weekly - 2 sets - 10 reps - Sleeper Stretch  - 1 x daily - 7 x weekly - 3 sets - 10 reps - Standing Shoulder Flexion to 90 Degrees with Dumbbells  - 2 x daily - 7 x weekly - 2 sets - 10 reps - Standing Shoulder Scaption  - 2 x daily - 7 x weekly - 2 sets - 10 reps - Standing Eccentric Bicep Curl Pronated then Supinated  - 1 x  daily - 7 x weekly - 3 sets - 10 reps - Standing Wall Ball Circles in Scaption with Plyo Ball  - 1 x daily - 7 x weekly - 1 sets - 20 reps - Shoulder Walk Outs on Table with Resistance Band  - 1 x daily - 7 x weekly - 1 sets - 10 reps ASSESSMENT:  CLINICAL IMPRESSION: Ocie arrived with some pain today but this was low and he tends to have frequent fluctuations in his pain.  He was able to complete all tasks with no increase in pain on review of HEP.  He continues some minor bilateral scapular and postural strength issues which he will continue to address with his HEP. He will also need to continue to focus on posterior capsule stretching and IR stretch with towel.  He has been fairly compliant and should continue to improve.  We will DC at this time.      OBJECTIVE IMPAIRMENTS: decreased mobility, decreased ROM, decreased strength, hypomobility, increased fascial restrictions, increased muscle spasms, impaired UE functional use, postural dysfunction, and pain.   ACTIVITY LIMITATIONS: carrying, lifting, transfers, bed mobility, bathing, toileting, dressing, reach over head, and hygiene/grooming  PARTICIPATION LIMITATIONS: meal prep, cleaning, laundry, driving, shopping, community activity, occupation, and yard work  PERSONAL FACTORS: Fitness, Past/current experiences, and 1-2 comorbidities: anxiety, depression, PTSD  are also affecting patient's functional outcome.   REHAB POTENTIAL: Good  CLINICAL DECISION MAKING: Stable/uncomplicated  EVALUATION COMPLEXITY: Low  GOALS: Goals reviewed with patient? Yes  SHORT TERM GOALS: Target date: 03/09/2023   Pain report to be no greater than 4/10  Baseline: Goal status: MET 03/19/23  2.  Patient will be independent with initial HEP  Baseline:  Goal status: MET 03/04/23  3.  ROM to improve to protocol limits Baseline:  Goal status: MET 02/23/23  LONG TERM GOALS: Target date: 06/01/2023    Patient to report pain no greater than 2/10   Baseline:  Goal status: MET 03/17/23  2.  Patient to be independent with advanced HEP  Baseline:  Goal status: MET  3.  Patient to report 50% improvement in overall symptoms and use of left UE Baseline:  Goal status: MET  4.  FOTO to be 67 Baseline: 54 on DC  Goal status: NOT MET  5.  ROM to be [facility] Baseline: ALL WNL Goal status: MET  6.  Strength to be 4 to 4+/5 all motions of left shoulder Baseline:  Goal status: MET  PLAN: PT FREQUENCY:  3 times per week for first 3-4 weeks then 2 times per week.   Updated 03/04/23 patient is ahead of protocol, decreasing frequency to 1 time per week until patient is 6 weeks post op.    PT DURATION: 8 weeks  PLANNED INTERVENTIONS: Therapeutic exercises, Therapeutic activity, Neuromuscular re-education, Balance training, Gait training, Patient/Family education, Self Care, Joint mobilization, Aquatic Therapy, Dry Needling, Electrical stimulation, Cryotherapy, Moist heat, scar mobilization, Taping, Vasopneumatic device, Traction, Ultrasound, Ionotophoresis 4mg /ml Dexamethasone, Manual therapy, and Re-evaluation  PLAN FOR NEXT SESSION: We will DC at this time.  Only goal not met was FOTO.    Victorino Dike B. Abdiel Blackerby, PT 06/01/23 11:31 AM [facility] [address], [address] [place], [place] [address] Phone # [phone] Fax [phone]

## 2024-07-03 ENCOUNTER — Encounter: Payer: Self-pay | Admitting: Physical Therapy

## 2024-07-03 ENCOUNTER — Other Ambulatory Visit: Payer: Self-pay

## 2024-07-03 ENCOUNTER — Ambulatory Visit: Attending: Physician Assistant | Admitting: Physical Therapy

## 2024-07-03 DIAGNOSIS — R252 Cramp and spasm: Secondary | ICD-10-CM | POA: Insufficient documentation

## 2024-07-03 DIAGNOSIS — M5459 Other low back pain: Secondary | ICD-10-CM | POA: Diagnosis present

## 2024-07-03 DIAGNOSIS — M542 Cervicalgia: Secondary | ICD-10-CM | POA: Insufficient documentation

## 2024-07-03 DIAGNOSIS — M6281 Muscle weakness (generalized): Secondary | ICD-10-CM | POA: Insufficient documentation

## 2024-07-03 NOTE — Therapy (Signed)
 OUTPATIENT PHYSICAL THERAPY THORACOLUMBAR EVALUATION   Patient Name: Tom White MRN: [id number] DOB:08-01-1961, 63 y.o., male Today's Date: 07/03/2024  END OF SESSION:  PT End of Session - 07/03/24 1803     Visit Number 1    Date for Recertification  08/28/24    Authorization Type [employer] Medicare    Progress Note Due on Visit 10    PT Start Time 1445    PT Stop Time 1530    PT Time Calculation (min) 45 min    Activity Tolerance Patient tolerated treatment well    Behavior During Therapy [facility] for tasks assessed/performed          Past Medical History:  Diagnosis Date   Arthritis    had it in my left ankle (05/04/2017)   Environmental and seasonal allergies 06/07/2015   Fibrosis of subtalar joint, 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   Mild cognitive impairment of uncertain or unknown etiology 09/11/2021   Mixed hyperlipidemia 06/14/2016   Pain in left ankle and joints of left foot 06/17/2017   Pain in rectum    Pneumonia 2017   PTSD (post-traumatic stress disorder) 01/30/2020   Slow transit constipation 04/02/2021   Urinary incontinence    Past Surgical History:  Procedure Laterality Date   ANKLE ARTHROSCOPY Left ~ 2013   scraped out arthritis   ANKLE ARTHROSCOPY WITH FUSION Left 09/22/2017   Procedure: LEFT ANKLE POSTERIOR ARTHROSCOPIC SUBTALAR ARTHRODESIS;  Surgeon: Harden Jerona GAILS, MD;  Location: [facility] OR;  Service: Orthopedics;  Laterality: Left;   ANKLE FRACTURE SURGERY Left 12/1980   APPLICATION OF WOUND VAC Right 05/03/2017   foot   APPLICATION OF WOUND VAC  Right 05/03/2017   Procedure: APPLICATION OF WOUND VAC;  Surgeon: Barbarann Oneil BROCKS, MD;  Location: [facility] OR;  Service: Orthopedics;  Laterality: Right;   FRACTURE SURGERY     I & D EXTREMITY Right 05/03/2017   foot   I & D EXTREMITY Right 05/03/2017   Procedure: IRRIGATION AND DEBRIDEMENT EXTREMITY RIGHT, REPAIR OF POSTERIOR TIBIAL TENDON;  Surgeon: Barbarann Oneil BROCKS, MD;  Location: [facility] OR;  Service: Orthopedics;  Laterality: Right;   LAPAROSCOPIC CHOLECYSTECTOMY  1996?   POSTERIOR TIBIAL TENDON REPAIR Right 05/03/2017   SUBTALAR JOINT ARTHROEREISIS Left 1985?   TONSILLECTOMY     Patient Active Problem List   Diagnosis Date Noted   GERD (gastroesophageal reflux disease) 09/11/2021   Mild cognitive impairment of uncertain or unknown etiology 09/11/2021   Urinary incontinence    Pain in rectum    Major depressive disorder    History of concussion    Malignant melanoma of skin of back 04/16/2021   Slow transit constipation 04/02/2021   PTSD (post-traumatic stress disorder) 01/30/2020   Pain in left ankle and joints of left foot 06/17/2017   Fibrosis of subtalar joint, left 01/21/2017   Mixed hyperlipidemia 06/14/2016   Environmental and seasonal allergies 06/07/2015   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   Generalized anxiety disorder 06/07/2015    PCP: Ozell Reilly, MD  REFERRING PROVIDER: Donata,  Kirstin, PA-C  REFERRING DIAG: [phone] (ICD-10-CM) - Spondylosis without myelopathy or radiculopathy, cervical region M47.816 (ICD-10-CM) - Spondylosis without myelopathy or radiculopathy, lumbar region  Rationale for Evaluation and Treatment: Rehabilitation  THERAPY DIAG:  Other low back pain  Cervicalgia  Cramp and spasm  Muscle weakness (generalized)  ONSET DATE: 3 mos ago - fell  SUBJECTIVE:                                                                                                                                                                                            SUBJECTIVE STATEMENT: Pt reports he fell 3 mos ago.  He fell going up deck stairs into the wooden deck.  Has had constant pain in neck and low back since then.  Has had some tingling from neck into shoulders but not extending into arms.  He denies LE symptoms but notes some possible balance issues.  PERTINENT HISTORY:  Lt ankle arthrodesis, PTSD, anxiety, depression  PAIN:   Are you having pain? Yes NPRS scale: 5-8/10 neck, 6/10 lumbar Pain location: bil neck, central lumbar Pain orientation: Medial and Lower (lumbar), base of skull and down to shoulders bil (cervical) PAIN TYPE: aching, dull, (lumbar) and sharp (neck) Pain description: constant  Aggravating factors: Cervical - sleep (is a stomach sleeper), turning head. Lumbar - sitting Relieving factors: unsure   PRECAUTIONS: None  RED FLAGS: None   WEIGHT BEARING RESTRICTIONS: No  FALLS:  Has patient fallen in last 6 months? Yes. Number of falls 1  LIVING ENVIRONMENT: Lives with: lives with their spouse Lives in: House/apartment Stairs: Yes: Internal: 16 steps; on right going up and on left going up and External: 5 steps; can reach both Has following equipment at home: None  OCCUPATION: retired, swimming at [employer]   PLOF: Independent  PATIENT GOALS: less pain, join his wife at the gym-3 days a week  NEXT MD VISIT: unsure  OBJECTIVE:  Note: Objective measures were completed at Evaluation unless otherwise noted.  DIAGNOSTIC FINDINGS:  Not accessible in chart but Pt reports neck and back have signif arthritis  PATIENT SURVEYS:  Modified Oswestry:  MODIFIED OSWESTRY DISABILITY SCALE  Date: 07/03/24 Score  Pain intensity 3 =  Pain medication provides me with moderate relief from pain.  2. Personal care (washing, dressing, etc.) 1 =  I can take care of myself normally, but it increases my pain.  3. Lifting 4 = I can lift only very light weights  4. Walking 1 = Pain prevents me from walking more than 1 mile.   5. Sitting 3 =  Pain prevents me from sitting more than  hour.  6. Standing 3 =  Pain prevents me from standing more than 1/2 hour.  7. Sleeping 3 =  Even when I take pain medication, I sleep less than 4 hours.  8. Social Life 3 =  Pain prevents me from going out very often.  9. Traveling 4 = My pain restricts my travel to short necessary journeys under 1/2 hour.  10. Employment/ Homemaking 2 = I can perform most of my homemaking/job duties, but pain prevents me from performing more physically stressful activities (eg, lifting, vacuuming).  Total 27/50   Interpretation of scores: Score Category Description  0-20% Minimal Disability The patient can cope with most living activities. Usually no treatment is indicated apart from advice on lifting, sitting and exercise  21-40% Moderate Disability The patient experiences more pain and difficulty with sitting, lifting and standing. Travel and social life are more difficult and they may be disabled from work. Personal care, sexual activity and sleeping are not grossly affected, and the patient can usually be managed by conservative means  41-60% Severe Disability Pain remains the main problem in this group, but activities of daily living are affected. These patients require a detailed investigation  61-80% Crippled Back pain impinges on all aspects of the patient's life. Positive intervention is required  81-100% Bed-bound  These patients are either bed-bound or exaggerating their symptoms  Bluford FORBES Zoe DELENA Karon DELENA, et al. Surgery versus conservative management of stable thoracolumbar fracture: the PRESTO feasibility RCT. [place] ([place]): [employer]; 2021 Nov. [facility] Technology Assessment, No. 25.62.) Appendix 3, Oswestry Disability Index category descriptors. Available from: FindJewelers.cz  Minimally Clinically Important Difference (MCID) = 12.8%  COGNITION: Overall cognitive status: Pt reports he has  had STM challenges lately     SENSATION: Pt reports tingling in bil neck and shoulders  MUSCLE LENGTH: Hamstrings: Right 65 deg; Left 65 deg Quads: limited 25% bil Piriformis: limited 25% bil  POSTURE: No Significant postural limitations  PALPATION: Tender Rt facets L4/5, L5/S1 Tender bil facets C5-C7 Trigger points present: deep cervical multifidi lower cervical spine and upper traps bil  LUMBAR ROM:   AROM eval  Flexion Hands to ankles with LBP  Extension Full with pain  Right lateral flexion Full with Rt pain  Left lateral flexion Full with Rt pain  Right rotation 75% with pain  Left rotation 75% with pain   (Blank rows = not tested)  LOWER EXTREMITY ROM:    Limited end range knee flexion in prone secondary to tight quads Limited end range hip flexion secondary to tight gluteals Good hip IR/ER  LOWER EXTREMITY MMT:   Hips 4/5 bil all planes Poor TA stabilization during LE MMT   GAIT: No major gait deviations  CERVICAL ROM:   Active ROM A/PROM (deg) eval  Flexion 30  Extension 30  Right lateral flexion 25  Left lateral flexion 25  Right rotation 40  Left rotation 50  Pain all motions above  UPPER EXTREMITY ROM: WFL with end range stiffness and pain at full elevation  UPPER EXTREMITY MMT: Cervical: strong and painful when activating Rt musculature Bil UE strength: weak middle and lower traps 4/5 bil    TREATMENT DATE:  07/03/24: Initiated HEP Discussed DN and cost - Pt ok with OOP cost for this service, has had it before at this clinic  PATIENT EDUCATION:  Education details: [email or web address] Person educated: Patient Education method: Explanation, Demonstration, Verbal cues, and Handouts Education comprehension: verbalized understanding and returned demonstration  HOME EXERCISE PROGRAM: Access Code: [email or web address] URL:  [email or web address] Date: 07/03/2024 Prepared by: Orvil Letecia Arps  Exercises - Supine Lower Trunk Rotation  - 1 x daily - 7 x weekly - 1 sets - 10 reps - 5 hold - Supine Posterior Pelvic Tilt  - 1 x daily - 7 x weekly - 1 sets - 10 reps - 5 hold - Supine Cervical Retraction with Towel  - 1 x daily - 7 x weekly - 1 sets - 10 reps - 5 hold - Hooklying Single Knee to Chest Stretch  - 1 x daily - 7 x weekly - 1 sets - 3 reps - 20 hold - Supine Piriformis Stretch with Foot on Ground  - 1 x daily - 7 x weekly - 1 sets - 3 reps - 20 hold - Standing Row with Anchored Resistance  - 1 x daily - 7 x weekly - 2 sets - 10 reps  ASSESSMENT:  CLINICAL IMPRESSION: Patient is a 63 y.o. male who was seen today for physical therapy evaluation and treatment for neck and low back pain following a fall into his wooden deck stairs 3 mos ago.  Pt did not have any pain prior to the fall.  Xrays performed since the fall reveal significant cervical arthritis and moderate lumbar arthritis.  Pt presents with limited and painful cervical and lumbar ROM, weakness of bil hips, core and cervical stabilizers, and flexibility restrictions.  His ODI is 54% placing him in the severe disability category.  His goals are to reduce pain and possibly join his wife at the gym several days a week.  Pt will benefit from skilled PT to address pain and limitations to maximize his tolerance of daily activities.  OBJECTIVE IMPAIRMENTS: decreased activity tolerance, decreased mobility, decreased ROM, decreased strength, hypomobility, increased muscle spasms, impaired flexibility, impaired sensation, improper body mechanics, and pain.   ACTIVITY LIMITATIONS: lifting, bending, sitting, standing, squatting, sleeping, and locomotion level  PARTICIPATION LIMITATIONS: cleaning, driving, shopping, community activity, and yard work  PERSONAL FACTORS: 1 comorbidity: Lt ankle arthrodesis are also affecting patient's functional outcome.    REHAB POTENTIAL: Excellent  CLINICAL DECISION MAKING: Evolving/moderate complexity  EVALUATION COMPLEXITY: Moderate   GOALS: Goals reviewed with patient? Yes  SHORT TERM GOALS: Target date: 07/31/24  Pt will be ind with initial HEP Baseline: Goal status: INITIAL  2.  Pt will report reduced pain with daily activities by at least 25% Baseline:  Goal status: INITIAL  3.  Pt will be able to sit up to 30 min without increased lumbar pain Baseline:  Goal status: INITIAL  4.  Pt will improve neck rotation to at least 55 deg bil without sharp pain Baseline:  Goal status: INITIAL  5.  Pt will be able to flex trunk to reach item on ground without increased back pain Baseline:  Goal status: INITIAL    LONG TERM GOALS: Target date: 08/28/24  Pt will be ind with advanced HEP Baseline:  Goal status: INITIAL  2.  Pt will improve ODI to </= 15/50 (30%) to demo reduced disability level Baseline:  Goal status: INITIAL  3.  Pt will be able to drive without any sharp neck pains when checking blind spots Baseline:  Goal status: INITIAL  4.  Pt will report improved pain in cervical spine to no greater than 3/10 most days of the week  Baseline: 5-8/10 Goal status: INITIAL  5.  Pt will report improved pain in lumbar spine to no greater than 3/10 with daily tasks most days of the week Baseline:  Goal status: INITIAL   PLAN:  PT FREQUENCY: 2x/week  PT DURATION: 8 weeks  PLANNED INTERVENTIONS: 97110-Therapeutic exercises, 97530- Therapeutic activity, 97112- Neuromuscular re-education, 97535- Self Care, [id number]- Manual therapy, [phone]- Aquatic Therapy, [id number]- Electrical stimulation (unattended), [phone]- Electrical stimulation (manual), [id number]- Traction (mechanical), 20560 (1-2 muscles), 20561 (3+ muscles)- Dry Needling, Patient/Family education, Taping, Joint mobilization, Spinal mobilization, Cryotherapy, and Moist heat.  PLAN FOR NEXT SESSION: dry needle cervical and lumbar  spine, review and progress HEP for LE and neck stretching, trunk and neck stabilization, hip strength bil   Deavion Dobbs, PT 07/03/24 6:04 PM

## 2024-07-11 ENCOUNTER — Ambulatory Visit: Attending: Physician Assistant

## 2024-07-11 DIAGNOSIS — R252 Cramp and spasm: Secondary | ICD-10-CM | POA: Insufficient documentation

## 2024-07-11 DIAGNOSIS — M5459 Other low back pain: Secondary | ICD-10-CM | POA: Diagnosis present

## 2024-07-11 DIAGNOSIS — M6281 Muscle weakness (generalized): Secondary | ICD-10-CM | POA: Diagnosis present

## 2024-07-11 DIAGNOSIS — M542 Cervicalgia: Secondary | ICD-10-CM | POA: Diagnosis present

## 2024-07-11 DIAGNOSIS — M25612 Stiffness of left shoulder, not elsewhere classified: Secondary | ICD-10-CM | POA: Insufficient documentation

## 2024-07-11 DIAGNOSIS — R293 Abnormal posture: Secondary | ICD-10-CM | POA: Diagnosis present

## 2024-07-11 DIAGNOSIS — M25512 Pain in left shoulder: Secondary | ICD-10-CM | POA: Insufficient documentation

## 2024-07-11 NOTE — Therapy (Signed)
 OUTPATIENT PHYSICAL THERAPY THORACOLUMBAR EVALUATION   Patient Name: Tom White MRN: [id number] DOB:[birth year], 63 y.o., male Today's Date: 07/11/2024  END OF SESSION:  PT End of Session - 07/11/24 1359     Visit Number 2    Date for Recertification  08/28/24    Authorization Type [employer] Medicare    Progress Note Due on Visit 10    PT Start Time 1400    PT Stop Time 1445    PT Time Calculation (min) 45 min    Activity Tolerance Patient tolerated treatment well    Behavior During Therapy [facility] for tasks assessed/performed          Past Medical History:  Diagnosis Date   Arthritis    had it in my left ankle (05/04/2017)   Environmental and seasonal allergies 06/07/2015   Fibrosis of subtalar joint, 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   Mild cognitive impairment of uncertain or unknown etiology 09/11/2021   Mixed hyperlipidemia 06/14/2016   Pain in left ankle and joints of left foot 06/17/2017   Pain in rectum    Pneumonia 2017   PTSD (post-traumatic stress disorder) 01/30/2020   Slow transit constipation 04/02/2021   Urinary incontinence    Past Surgical History:  Procedure Laterality Date   ANKLE ARTHROSCOPY Left ~ 2013   scraped out arthritis   ANKLE ARTHROSCOPY WITH FUSION Left 09/22/2017   Procedure: LEFT ANKLE POSTERIOR ARTHROSCOPIC SUBTALAR ARTHRODESIS;  Surgeon: Harden Jerona GAILS, MD;  Location: [facility] OR;  Service: Orthopedics;  Laterality: Left;   ANKLE FRACTURE SURGERY Left 12/1980   APPLICATION OF WOUND VAC Right 05/03/2017   foot   APPLICATION OF WOUND VAC  Right 05/03/2017   Procedure: APPLICATION OF WOUND VAC;  Surgeon: Barbarann Oneil BROCKS, MD;  Location: [facility] OR;  Service: Orthopedics;  Laterality: Right;   FRACTURE SURGERY     I & D EXTREMITY Right 05/03/2017   foot   I & D EXTREMITY Right 05/03/2017   Procedure: IRRIGATION AND DEBRIDEMENT EXTREMITY RIGHT, REPAIR OF POSTERIOR TIBIAL TENDON;  Surgeon: Barbarann Oneil BROCKS, MD;  Location: [facility] OR;  Service: Orthopedics;  Laterality: Right;   LAPAROSCOPIC CHOLECYSTECTOMY  1996?   POSTERIOR TIBIAL TENDON REPAIR Right 05/03/2017   SUBTALAR JOINT ARTHROEREISIS Left 1985?   TONSILLECTOMY     Patient Active Problem List   Diagnosis Date Noted   GERD (gastroesophageal reflux disease) 09/11/2021   Mild cognitive impairment of uncertain or unknown etiology 09/11/2021   Urinary incontinence    Pain in rectum    Major depressive disorder    History of concussion    Malignant melanoma of skin of back 04/16/2021   Slow transit constipation 04/02/2021   PTSD (post-traumatic stress disorder) 01/30/2020   Pain in left ankle and joints of left foot 06/17/2017   Fibrosis of subtalar joint, left 01/21/2017   Mixed hyperlipidemia 06/14/2016   Environmental and seasonal allergies 06/07/2015   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   Generalized anxiety disorder 06/07/2015    PCP: Ozell Reilly, MD  REFERRING PROVIDER: Donata,  Kirstin, PA-C  REFERRING DIAG: [phone] (ICD-10-CM) - Spondylosis without myelopathy or radiculopathy, cervical region M47.816 (ICD-10-CM) - Spondylosis without myelopathy or radiculopathy, lumbar region  Rationale for Evaluation and Treatment: Rehabilitation  THERAPY DIAG:  Cervicalgia  Cramp and spasm  Abnormal posture  Muscle weakness (generalized)  Acute pain of left shoulder  Stiffness of left shoulder, not elsewhere classified  Other low back pain  ONSET DATE: 3 mos ago - fell  SUBJECTIVE:                                                                                                                                                                                            SUBJECTIVE STATEMENT: Pt reports he is about the same on pain level.  PERTINENT HISTORY:  Lt ankle arthrodesis, PTSD, anxiety, depression  PAIN:   07/11/24 Are you having pain? Yes NPRS scale: 5-8/10 neck, 6/10 lumbar Pain location: bil neck, central lumbar Pain orientation: Medial and Lower (lumbar), base of skull and down to shoulders bil (cervical) PAIN TYPE: aching, dull, (lumbar) and sharp (neck) Pain description: constant  Aggravating factors: Cervical - sleep (is a stomach sleeper), turning head. Lumbar - sitting Relieving factors: unsure   PRECAUTIONS: None  RED FLAGS: None   WEIGHT BEARING RESTRICTIONS: No  FALLS:  Has patient fallen in last 6 months? Yes. Number of falls 1  LIVING ENVIRONMENT: Lives with: lives with their spouse Lives in: House/apartment Stairs: Yes: Internal: 16 steps; on right going up and on left going up and External: 5 steps; can reach both Has following equipment at home: None  OCCUPATION: retired, swimming at [employer]   PLOF: Independent  PATIENT GOALS: less pain, join his wife at the gym-3 days a week  NEXT MD VISIT: unsure  OBJECTIVE:  Note: Objective measures were completed at Evaluation unless otherwise noted.  DIAGNOSTIC FINDINGS:  Not accessible in chart but Pt reports neck and back have signif arthritis  PATIENT SURVEYS:  Modified Oswestry:  MODIFIED OSWESTRY DISABILITY SCALE  Date: 07/03/24 Score  Pain intensity 3 =  Pain medication provides me with moderate relief from pain.  2. Personal care (washing, dressing, etc.) 1 =  I can take care of myself normally, but it increases my pain.  3. Lifting 4 = I can lift only very light weights  4. Walking 1 = Pain prevents me from walking more than 1 mile.  5. Sitting 3 =  Pain prevents me from sitting more than  hour.  6. Standing 3 =  Pain prevents me from standing  more than 1/2 hour.  7. Sleeping 3 =  Even when I take pain medication,  I sleep less than 4 hours.  8. Social Life 3 =  Pain prevents me from going out very often.  9. Traveling 4 = My pain restricts my travel to short necessary journeys under 1/2 hour.  10. Employment/ Homemaking 2 = I can perform most of my homemaking/job duties, but pain prevents me from performing more physically stressful activities (eg, lifting, vacuuming).  Total 27/50   Interpretation of scores: Score Category Description  0-20% Minimal Disability The patient can cope with most living activities. Usually no treatment is indicated apart from advice on lifting, sitting and exercise  21-40% Moderate Disability The patient experiences more pain and difficulty with sitting, lifting and standing. Travel and social life are more difficult and they may be disabled from work. Personal care, sexual activity and sleeping are not grossly affected, and the patient can usually be managed by conservative means  41-60% Severe Disability Pain remains the main problem in this group, but activities of daily living are affected. These patients require a detailed investigation  61-80% Crippled Back pain impinges on all aspects of the patient's life. Positive intervention is required  81-100% Bed-bound  These patients are either bed-bound or exaggerating their symptoms  Bluford FORBES Zoe DELENA Karon DELENA, et al. Surgery versus conservative management of stable thoracolumbar fracture: the PRESTO feasibility RCT. [place]): [employer]; 2021 Nov. [facility] Technology Assessment, No. 25.62.) Appendix 3, Oswestry Disability Index category descriptors. Available from: FindJewelers.cz  Minimally Clinically Important Difference (MCID) = 12.8%  COGNITION: Overall cognitive status: Pt reports he has had STM challenges lately     SENSATION: Pt reports tingling in bil neck and shoulders  MUSCLE  LENGTH: Hamstrings: Right 65 deg; Left 65 deg Quads: limited 25% bil Piriformis: limited 25% bil  POSTURE: No Significant postural limitations  PALPATION: Tender Rt facets L4/5, L5/S1 Tender bil facets C5-C7 Trigger points present: deep cervical multifidi lower cervical spine and upper traps bil  LUMBAR ROM:   AROM eval  Flexion Hands to ankles with LBP  Extension Full with pain  Right lateral flexion Full with Rt pain  Left lateral flexion Full with Rt pain  Right rotation 75% with pain  Left rotation 75% with pain   (Blank rows = not tested)  LOWER EXTREMITY ROM:    Limited end range knee flexion in prone secondary to tight quads Limited end range hip flexion secondary to tight gluteals Good hip IR/ER  LOWER EXTREMITY MMT:   Hips 4/5 bil all planes Poor TA stabilization during LE MMT   GAIT: No major gait deviations  CERVICAL ROM:   Active ROM A/PROM (deg) eval  Flexion 30  Extension 30  Right lateral flexion 25  Left lateral flexion 25  Right rotation 40  Left rotation 50  Pain all motions above  UPPER EXTREMITY ROM: WFL with end range stiffness and pain at full elevation  UPPER EXTREMITY MMT: Cervical: strong and painful when activating Rt musculature Bil UE strength: weak middle and lower traps 4/5 bil    TREATMENT DATE:  07/11/24: Standing hamstring stretch 3 x 30 sec Standing quad/hip flexor stretch 1 x 30 sec (patient states he does not feel a stretch even with 2 balance pads in chair) Seated piriformis stretch 3 x 30 sec Supine PPT x 10 PPT with 90/90 heel taps x 20 PPT with dying bug x 20 PPT with ball pass 2 x 10 ( had to stop at 6 on first set and finish after resting) MELT for C-spine: rotation  x 20, flex/ext x 20, figure 8 x 5 each side  07/03/24: Initiated HEP Discussed DN and cost - Pt ok with OOP cost for this service, has had it before at this clinic                                                                                                                                  PATIENT EDUCATION:  Education details: [email or web address] Person educated: Patient Education method: Explanation, Demonstration, Verbal cues, and Handouts Education comprehension: verbalized understanding and returned demonstration  HOME EXERCISE PROGRAM: Access Code: [email or web address] URL: [email or web address] Date: 07/11/2024 Prepared by: Delon Haddock  Exercises - Supine Lower Trunk Rotation  - 1 x daily - 7 x weekly - 1 sets - 10 reps - 5 hold - Supine Posterior Pelvic Tilt  - 1 x daily - 7 x weekly - 1 sets - 10 reps - 5 hold - Supine Cervical Retraction with Towel  - 1 x daily - 7 x weekly - 1 sets - 10 reps - 5 hold - Hooklying Single Knee to Chest Stretch  - 1 x daily - 7 x weekly - 1 sets - 3 reps - 20 hold - Supine Piriformis Stretch with Foot on Ground  - 1 x daily - 7 x weekly - 1 sets - 3 reps - 20 hold - Standing Row with Anchored Resistance  - 1 x daily - 7 x weekly - 2 sets - 10 reps - Standing Hamstring Stretch on Chair  - 1 x daily - 7 x weekly - 1 sets - 3 reps - 30 sec hold - Quadricep Stretch with Chair and Counter Support  - 1 x daily - 7 x weekly - 1 sets - 3 reps - 30 sec hold - Seated Figure 4 Piriformis Stretch  - 1 x daily - 7 x weekly - 1 sets - 3 reps - 30 sed hold - Supine 90/90 Alternating Heel Touches with Posterior Pelvic Tilt  - 1 x daily - 7 x weekly - 3 sets - 10 reps - Supine Dead Bug with Leg Extension  - 1 x daily - 7 x weekly - 3 sets - 10 reps - Supine Swiss Ball Handoff  - 1 x daily - 7 x weekly - 3 sets - 10 reps ASSESSMENT:  CLINICAL IMPRESSION: Tom White reports no improvements.  He was able to complete core work with minimal to no pain.  He reported doing ok at end of session.  With other episodes of care, his pain does not seem to change much but he is typically able to progress without exacerbation of pain.    Pt will benefit from skilled PT to address pain and limitations to maximize his  tolerance of daily activities.  OBJECTIVE IMPAIRMENTS: decreased activity tolerance, decreased mobility, decreased ROM, decreased strength, hypomobility, increased muscle spasms, impaired flexibility, impaired sensation, improper body mechanics, and pain.   ACTIVITY LIMITATIONS: lifting, bending, sitting,  standing, squatting, sleeping, and locomotion level  PARTICIPATION LIMITATIONS: cleaning, driving, shopping, community activity, and yard work  PERSONAL FACTORS: 1 comorbidity: Lt ankle arthrodesis are also affecting patient's functional outcome.   REHAB POTENTIAL: Excellent  CLINICAL DECISION MAKING: Evolving/moderate complexity  EVALUATION COMPLEXITY: Moderate   GOALS: Goals reviewed with patient? Yes  SHORT TERM GOALS: Target date: 07/31/24  Pt will be ind with initial HEP Baseline: Goal status: INITIAL  2.  Pt will report reduced pain with daily activities by at least 25% Baseline:  Goal status: INITIAL  3.  Pt will be able to sit up to 30 min without increased lumbar pain Baseline:  Goal status: INITIAL  4.  Pt will improve neck rotation to at least 55 deg bil without sharp pain Baseline:  Goal status: INITIAL  5.  Pt will be able to flex trunk to reach item on ground without increased back pain Baseline:  Goal status: INITIAL    LONG TERM GOALS: Target date: 08/28/24  Pt will be ind with advanced HEP Baseline:  Goal status: INITIAL  2.  Pt will improve ODI to </= 15/50 (30%) to demo reduced disability level Baseline:  Goal status: INITIAL  3.  Pt will be able to drive without any sharp neck pains when checking blind spots Baseline:  Goal status: INITIAL  4.  Pt will report improved pain in cervical spine to no greater than 3/10 most days of the week Baseline: 5-8/10 Goal status: INITIAL  5.  Pt will report improved pain in lumbar spine to no greater than 3/10 with daily tasks most days of the week Baseline:  Goal status: INITIAL   PLAN:  PT  FREQUENCY: 2x/week  PT DURATION: 8 weeks  PLANNED INTERVENTIONS: 97110-Therapeutic exercises, 97530- Therapeutic activity, 97112- Neuromuscular re-education, 97535- Self Care, 02859- Manual therapy, [id number]- Aquatic Therapy, [id number]- Electrical stimulation (unattended), [id number]- Electrical stimulation (manual), [id number]- Traction (mechanical), 20560 (1-2 muscles), 20561 (3+ muscles)- Dry Needling, Patient/Family education, Taping, Joint mobilization, Spinal mobilization, Cryotherapy, and Moist heat.  PLAN FOR NEXT SESSION: dry needle cervical and lumbar spine if patient agrees to fees (he was not sure about wanting to pay), review and progress HEP for LE and neck stretching, trunk and neck stabilization, hip strength bil   Tom White B. Gyanna Jarema, PT 07/11/24 3:43 PM [facility] [address] [place], [place] [address] Phone # [phone] Fax [phone]

## 2024-07-27 ENCOUNTER — Ambulatory Visit

## 2024-07-27 DIAGNOSIS — M542 Cervicalgia: Secondary | ICD-10-CM

## 2024-07-27 DIAGNOSIS — R293 Abnormal posture: Secondary | ICD-10-CM

## 2024-07-27 DIAGNOSIS — M6281 Muscle weakness (generalized): Secondary | ICD-10-CM

## 2024-07-27 DIAGNOSIS — R252 Cramp and spasm: Secondary | ICD-10-CM

## 2024-07-27 DIAGNOSIS — M5459 Other low back pain: Secondary | ICD-10-CM

## 2024-07-27 NOTE — Therapy (Signed)
 OUTPATIENT PHYSICAL THERAPY THORACOLUMBAR TREATMENT NOTE   Patient Name: Tom White MRN: [id number] DOB:1961-10-01, 63 y.o., male Today's Date: 07/27/2024  END OF SESSION:  PT End of Session - 07/27/24 1406     Visit Number 3    Date for Recertification  08/28/24    Authorization Type [employer] Medicare    Progress Note Due on Visit 10    PT Start Time 1400    PT Stop Time 1445    PT Time Calculation (min) 45 min    Activity Tolerance Patient tolerated treatment well    Behavior During Therapy [facility] for tasks assessed/performed          Past Medical History:  Diagnosis Date   Arthritis    had it in my left ankle (05/04/2017)   Environmental and seasonal allergies 06/07/2015   Fibrosis of subtalar joint, 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   Mild cognitive impairment of uncertain or unknown etiology 09/11/2021   Mixed hyperlipidemia 06/14/2016   Pain in left ankle and joints of left foot 06/17/2017   Pain in rectum    Pneumonia 2017   PTSD (post-traumatic stress disorder) 01/30/2020   Slow transit constipation 04/02/2021   Urinary incontinence    Past Surgical History:  Procedure Laterality Date   ANKLE ARTHROSCOPY Left ~ 2013   scraped out arthritis   ANKLE ARTHROSCOPY WITH FUSION Left 09/22/2017   Procedure: LEFT ANKLE POSTERIOR ARTHROSCOPIC SUBTALAR ARTHRODESIS;  Surgeon: Harden Jerona GAILS, MD;  Location: [facility] OR;  Service: Orthopedics;  Laterality: Left;   ANKLE FRACTURE SURGERY Left 12/1980   APPLICATION OF WOUND VAC Right 05/03/2017   foot   APPLICATION OF WOUND  VAC Right 05/03/2017   Procedure: APPLICATION OF WOUND VAC;  Surgeon: Barbarann Oneil BROCKS, MD;  Location: [facility] OR;  Service: Orthopedics;  Laterality: Right;   FRACTURE SURGERY     I & D EXTREMITY Right 05/03/2017   foot   I & D EXTREMITY Right 05/03/2017   Procedure: IRRIGATION AND DEBRIDEMENT EXTREMITY RIGHT, REPAIR OF POSTERIOR TIBIAL TENDON;  Surgeon: Barbarann Oneil BROCKS, MD;  Location: [facility] OR;  Service: Orthopedics;  Laterality: Right;   LAPAROSCOPIC CHOLECYSTECTOMY  1996?   POSTERIOR TIBIAL TENDON REPAIR Right 05/03/2017   SUBTALAR JOINT ARTHROEREISIS Left 1985?   TONSILLECTOMY     Patient Active Problem List   Diagnosis Date Noted   GERD (gastroesophageal reflux disease) 09/11/2021   Mild cognitive impairment of uncertain or unknown etiology 09/11/2021   Urinary incontinence    Pain in rectum    Major depressive disorder    History of concussion    Malignant melanoma of skin of back 04/16/2021   Slow transit constipation 04/02/2021   PTSD (post-traumatic stress disorder) 01/30/2020   Pain in left ankle and joints of left foot 06/17/2017   Fibrosis of subtalar joint, left 01/21/2017   Mixed hyperlipidemia 06/14/2016   Environmental and seasonal allergies 06/07/2015   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   Generalized anxiety disorder 06/07/2015    PCP: Ozell Reilly, MD  REFERRING PROVIDER:  Shepperson, Kirstin, PA-C  REFERRING DIAG: [phone] (ICD-10-CM) - Spondylosis without myelopathy or radiculopathy, cervical region M47.816 (ICD-10-CM) - Spondylosis without myelopathy or radiculopathy, lumbar region  Rationale for Evaluation and Treatment: Rehabilitation  THERAPY DIAG:  Cervicalgia  Other low back pain  Cramp and spasm  Muscle weakness (generalized)  Abnormal posture  ONSET DATE: 3 mos ago - fell  SUBJECTIVE:                                                                                                                                                                                            SUBJECTIVE STATEMENT: Pt reports neck is a little worse, back is about the same  PERTINENT HISTORY:  Lt ankle arthrodesis, PTSD, anxiety, depression  PAIN:   07/27/24 Are you having pain? Yes NPRS scale: 9/10 neck, 6/10 lumbar Pain location: bil neck, central lumbar Pain orientation: Medial and Lower (lumbar), base of skull and down to shoulders bil (cervical) PAIN TYPE: aching, dull, (lumbar) and sharp (neck) Pain description: constant  Aggravating factors: Cervical - sleep (is a stomach sleeper), turning head. Lumbar - sitting Relieving factors: unsure   PRECAUTIONS: None  RED FLAGS: None   WEIGHT BEARING RESTRICTIONS: No  FALLS:  Has patient fallen in last 6 months? Yes. Number of falls 1  LIVING ENVIRONMENT: Lives with: lives with their spouse Lives in: House/apartment Stairs: Yes: Internal: 16 steps; on right going up and on left going up and External: 5 steps; can reach both Has following equipment at home: None  OCCUPATION: retired, swimming at [employer]   PLOF: Independent  PATIENT GOALS: less pain, join his wife at the gym-3 days a week  NEXT MD VISIT: unsure  OBJECTIVE:  Note: Objective measures were completed at Evaluation unless otherwise noted.  DIAGNOSTIC FINDINGS:  Not accessible in chart but Pt reports neck and back have signif arthritis  PATIENT SURVEYS:  Modified Oswestry:  MODIFIED OSWESTRY DISABILITY SCALE  Date: 07/03/24 Score  Pain intensity 3 =  Pain medication provides me with moderate relief from pain.  2. Personal care (washing, dressing, etc.) 1 =  I can take care of myself normally, but it increases my pain.  3. Lifting 4 = I can lift only very light weights  4. Walking 1 = Pain prevents me from walking more than 1 mile.  5. Sitting 3 =  Pain prevents me from sitting more than  hour.  6. Standing 3 =  Pain prevents me from standing more than 1/2 hour.  7. Sleeping 3 =  Even when I take pain medication,  I sleep less than 4 hours.  8. Social Life 3 =  Pain prevents me from going out very often.  9. Traveling 4 = My pain restricts my travel to short necessary journeys under 1/2 hour.  10. Employment/ Homemaking 2 = I can perform most of my homemaking/job duties, but pain prevents me from performing more physically stressful activities (eg, lifting, vacuuming).  Total 27/50   Interpretation of scores: Score Category Description  0-20% Minimal Disability The patient can cope with most living activities. Usually no treatment is indicated apart from advice on lifting, sitting and exercise  21-40% Moderate Disability The patient experiences more pain and difficulty with sitting, lifting and standing. Travel and social life are more difficult and they may be disabled from work. Personal care, sexual activity and sleeping are not grossly affected, and the patient can usually be managed by conservative means  41-60% Severe Disability Pain remains the main problem in this group, but activities of daily living are affected. These patients require a detailed investigation  61-80% Crippled Back pain impinges on all aspects of the patient's life. Positive intervention is required  81-100% Bed-bound  These patients are either bed-bound or exaggerating their symptoms  Bluford FORBES Zoe DELENA Karon DELENA, et al. Surgery versus conservative management of stable thoracolumbar fracture: the PRESTO feasibility RCT. [place] ([place]): [employer]; 2021 Nov. [facility] Technology Assessment, No. 25.62.) Appendix 3, Oswestry Disability Index category descriptors. Available from: FindJewel[employer].cz  Minimally Clinically Important Difference (MCID) = 12.8%  COGNITION: Overall cognitive status: Pt reports he has had STM challenges lately     SENSATION: Pt reports tingling in bil neck and shoulders  MUSCLE LENGTH: Hamstrings: Right 65 deg; Left 65 deg Quads: limited 25% bil Piriformis:  limited 25% bil  POSTURE: No Significant postural limitations  PALPATION: Tender Rt facets L4/5, L5/S1 Tender bil facets C5-C7 Trigger points present: deep cervical multifidi lower cervical spine and upper traps bil  LUMBAR ROM:   AROM eval  Flexion Hands to ankles with LBP  Extension Full with pain  Right lateral flexion Full with Rt pain  Left lateral flexion Full with Rt pain  Right rotation 75% with pain  Left rotation 75% with pain   (Blank rows = not tested)  LOWER EXTREMITY ROM:    Limited end range knee flexion in prone secondary to tight quads Limited end range hip flexion secondary to tight gluteals Good hip IR/ER  LOWER EXTREMITY MMT:   Hips 4/5 bil all planes Poor TA stabilization during LE MMT   GAIT: No major gait deviations  CERVICAL ROM:   Active ROM A/PROM (deg) eval  Flexion 30  Extension 30  Right lateral flexion 25  Left lateral flexion 25  Right rotation 40  Left rotation 50  Pain all motions above  UPPER EXTREMITY ROM: WFL with end range stiffness and pain at full elevation  UPPER EXTREMITY MMT: Cervical: strong and painful when activating Rt musculature Bil UE strength: weak middle and lower traps 4/5 bil    TREATMENT DATE:  07/27/24: Nustep x 10 min PT present to discuss DN : educated on principle and precautions Standing hamstring stretch 3 x 30 sec Seated piriformis stretch 3 x 30 sec Prone wing taps x 10 Prone reach and pull x 10 Trigger Point Dry Needling Initial Treatment: Pt instructed on Dry Needling rational, procedures, and possible side effects. Pt instructed to expect mild to moderate muscle soreness later in the day and/or into the next day.  Pt instructed in methods to reduce muscle soreness. Pt instructed to continue prescribed HEP. Because  Dry Needling was performed over or adjacent to a lung field, pt was educated on S/S of pneumothorax and to seek immediate medical attention should they occur.  Patient was  educated on signs and symptoms of infection and other risk factors and advised to seek medical attention should they occur.  Patient verbalized understanding of these instructions and education.  Patient Verbal Consent Given: Yes Education Handout Provided: Yes Muscles Treated: Lumbar and cervical multifidi, bilateral upper traps and levator scap, bilateral sub occipitals Electrical Stimulation Performed: No Treatment Response/Outcome: Skilled palpation used to identify taut bands and trigger points.  Once identified, dry needling techniques used to treat these areas.  Twitch response ellicited along with palpable elongation of muscle.  Following treatment, patient reported feeling more relaxed   07/11/24: Standing hamstring stretch 3 x 30 sec Standing quad/hip flexor stretch 1 x 30 sec (patient states he does not feel a stretch even with 2 balance pads in chair) Seated piriformis stretch 3 x 30 sec Supine PPT x 10 PPT with 90/90 heel taps x 20 PPT with dying bug x 20 PPT with ball pass 2 x 10 ( had to stop at 6 on first set and finish after resting) MELT for C-spine: rotation x 20, flex/ext x 20, figure 8 x 5 each side  07/03/24: Initiated HEP Discussed DN and cost - Pt ok with OOP cost for this service, has had it before at this clinic                                                                                                                                 PATIENT EDUCATION:  Education details: [email or web address] Person educated: Patient Education method: Explanation, Demonstration, Verbal cues, and Handouts Education comprehension: verbalized understanding and returned demonstration  HOME EXERCISE PROGRAM: Access Code: [facility] URL: [email or web address] Date: 07/11/2024 Prepared by: Delon Haddock  Exercises - Supine Lower Trunk Rotation  - 1 x daily - 7 x weekly - 1 sets - 10 reps - 5 hold - Supine Posterior Pelvic Tilt  - 1 x daily - 7 x weekly - 1 sets - 10  reps - 5 hold - Supine Cervical Retraction with Towel  - 1 x daily - 7 x weekly - 1 sets - 10 reps - 5 hold - Hooklying Single Knee to Chest Stretch  - 1 x daily - 7 x weekly - 1 sets - 3 reps - 20 hold - Supine Piriformis Stretch with Foot on Ground  - 1 x daily - 7 x weekly - 1 sets - 3 reps - 20 hold - Standing Row with Anchored Resistance  - 1 x daily - 7 x weekly - 2 sets - 10 reps - Standing Hamstring Stretch on Chair  - 1 x daily - 7 x weekly - 1 sets - 3 reps - 30 sec hold - Quadricep Stretch with Chair and Counter Support  - 1 x daily - 7 x weekly -  1 sets - 3 reps - 30 sec hold - Seated Figure 4 Piriformis Stretch  - 1 x daily - 7 x weekly - 1 sets - 3 reps - 30 sed hold - Supine 90/90 Alternating Heel Touches with Posterior Pelvic Tilt  - 1 x daily - 7 x weekly - 3 sets - 10 reps - Supine Dead Bug with Leg Extension  - 1 x daily - 7 x weekly - 3 sets - 10 reps - Supine Swiss Ball Handoff  - 1 x daily - 7 x weekly - 3 sets - 10 reps ASSESSMENT:  CLINICAL IMPRESSION: Evyn continues to report no change and actually worse in neck.  He is able to do all stretches and exercise with ease.  He did have a little fatigue with the prone postural exercises.  His ability to do the level of exercise with ease is inconsistent with his reported pain levels.  We added dry needling today.  He had pronounced twitch response in bilateral upper traps and reported feeling relaxed at end of session.   Pt will benefit from skilled PT to address pain and limitations to maximize his tolerance of daily activities.  OBJECTIVE IMPAIRMENTS: decreased activity tolerance, decreased mobility, decreased ROM, decreased strength, hypomobility, increased muscle spasms, impaired flexibility, impaired sensation, improper body mechanics, and pain.   ACTIVITY LIMITATIONS: lifting, bending, sitting, standing, squatting, sleeping, and locomotion level  PARTICIPATION LIMITATIONS: cleaning, driving, shopping, community  activity, and yard work  PERSONAL FACTORS: 1 comorbidity: Lt ankle arthrodesis are also affecting patient's functional outcome.   REHAB POTENTIAL: Excellent  CLINICAL DECISION MAKING: Evolving/moderate complexity  EVALUATION COMPLEXITY: Moderate   GOALS: Goals reviewed with patient? Yes  SHORT TERM GOALS: Target date: 07/31/24  Pt will be ind with initial HEP Baseline: Goal status: INITIAL  2.  Pt will report reduced pain with daily activities by at least 25% Baseline:  Goal status: INITIAL  3.  Pt will be able to sit up to 30 min without increased lumbar pain Baseline:  Goal status: INITIAL  4.  Pt will improve neck rotation to at least 55 deg bil without sharp pain Baseline:  Goal status: INITIAL  5.  Pt will be able to flex trunk to reach item on ground without increased back pain Baseline:  Goal status: INITIAL    LONG TERM GOALS: Target date: 08/28/24  Pt will be ind with advanced HEP Baseline:  Goal status: INITIAL  2.  Pt will improve ODI to </= 15/50 (30%) to demo reduced disability level Baseline:  Goal status: INITIAL  3.  Pt will be able to drive without any sharp neck pains when checking blind spots Baseline:  Goal status: INITIAL  4.  Pt will report improved pain in cervical spine to no greater than 3/10 most days of the week Baseline: 5-8/10 Goal status: INITIAL  5.  Pt will report improved pain in lumbar spine to no greater than 3/10 with daily tasks most days of the week Baseline:  Goal status: INITIAL   PLAN:  PT FREQUENCY: 2x/week  PT DURATION: 8 weeks  PLANNED INTERVENTIONS: 97110-Therapeutic exercises, 97530- Therapeutic activity, 97112- Neuromuscular re-education, 97535- Self Care, [id number]- Manual therapy, [phone]- Aquatic Therapy, [id number]- Electrical stimulation (unattended), [phone]- Electrical stimulation (manual), [id number]- Traction (mechanical), 20560 (1-2 muscles), 20561 (3+ muscles)- Dry Needling, Patient/Family education, Taping,  Joint mobilization, Spinal mobilization, Cryotherapy, and Moist heat.  PLAN FOR NEXT SESSION: Assess response to dry needling to cervical and lumbar spine , review and progress HEP  for LE and neck stretching, trunk and neck stabilization, hip strength bil  Shante Archambeault B. Faizah Kandler, PT 07/27/24 5:47 PM [facility] [address], [address] [place], [place] [address] Phone # [phone] Fax [phone]

## 2024-07-31 ENCOUNTER — Encounter: Payer: Self-pay | Admitting: Physical Therapy

## 2024-07-31 ENCOUNTER — Ambulatory Visit: Admitting: Physical Therapy

## 2024-07-31 DIAGNOSIS — R293 Abnormal posture: Secondary | ICD-10-CM

## 2024-07-31 DIAGNOSIS — R252 Cramp and spasm: Secondary | ICD-10-CM

## 2024-07-31 DIAGNOSIS — M542 Cervicalgia: Secondary | ICD-10-CM

## 2024-07-31 DIAGNOSIS — M5459 Other low back pain: Secondary | ICD-10-CM

## 2024-07-31 DIAGNOSIS — M6281 Muscle weakness (generalized): Secondary | ICD-10-CM

## 2024-07-31 NOTE — Therapy (Signed)
 OUTPATIENT PHYSICAL THERAPY THORACOLUMBAR TREATMENT NOTE   Patient Name: Tom White MRN: [id number] DOB:Nov 27, 1960, 63 y.o., male Today's Date: 07/31/2024  END OF SESSION:  PT End of Session - 07/31/24 1357     Visit Number 4    Date for Recertification  08/28/24    Authorization Type [employer] Medicare    Progress Note Due on Visit 10    PT Start Time 1400    PT Stop Time 1445    PT Time Calculation (min) 45 min    Activity Tolerance Patient tolerated treatment well    Behavior During Therapy [facility] for tasks assessed/performed           Past Medical History:  Diagnosis Date   Arthritis    had it in my left ankle (05/04/2017)   Environmental and seasonal allergies 06/07/2015   Fibrosis of subtalar joint, 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   Mild cognitive impairment of uncertain or unknown etiology 09/11/2021   Mixed hyperlipidemia 06/14/2016   Pain in left ankle and joints of left foot 06/17/2017   Pain in rectum    Pneumonia 2017   PTSD (post-traumatic stress disorder) 01/30/2020   Slow transit constipation 04/02/2021   Urinary incontinence    Past Surgical History:  Procedure Laterality Date   ANKLE ARTHROSCOPY Left ~ 2013   scraped out arthritis   ANKLE ARTHROSCOPY WITH FUSION Left 09/22/2017   Procedure: LEFT ANKLE POSTERIOR ARTHROSCOPIC SUBTALAR ARTHRODESIS;  Surgeon: Harden Jerona GAILS, MD;  Location: [facility] OR;  Service: Orthopedics;  Laterality: Left;   ANKLE FRACTURE SURGERY Left 12/1980   APPLICATION OF WOUND VAC Right 05/03/2017   foot   APPLICATION OF WOUND  VAC Right 05/03/2017   Procedure: APPLICATION OF WOUND VAC;  Surgeon: Barbarann Oneil BROCKS, MD;  Location: [facility] OR;  Service: Orthopedics;  Laterality: Right;   FRACTURE SURGERY     I & D EXTREMITY Right 05/03/2017   foot   I & D EXTREMITY Right 05/03/2017   Procedure: IRRIGATION AND DEBRIDEMENT EXTREMITY RIGHT, REPAIR OF POSTERIOR TIBIAL TENDON;  Surgeon: Barbarann Oneil BROCKS, MD;  Location: [facility] OR;  Service: Orthopedics;  Laterality: Right;   LAPAROSCOPIC CHOLECYSTECTOMY  1996?   POSTERIOR TIBIAL TENDON REPAIR Right 05/03/2017   SUBTALAR JOINT ARTHROEREISIS Left 1985?   TONSILLECTOMY     Patient Active Problem List   Diagnosis Date Noted   GERD (gastroesophageal reflux disease) 09/11/2021   Mild cognitive impairment of uncertain or unknown etiology 09/11/2021   Urinary incontinence    Pain in rectum    Major depressive disorder    History of concussion    Malignant melanoma of skin of back 04/16/2021   Slow transit constipation 04/02/2021   PTSD (post-traumatic stress disorder) 01/30/2020   Pain in left ankle and joints of left foot 06/17/2017   Fibrosis of subtalar joint, left 01/21/2017   Mixed hyperlipidemia 06/14/2016   Environmental and seasonal allergies 06/07/2015   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   Generalized anxiety disorder 06/07/2015    PCP: Ozell Reilly, MD  REFERRING  PROVIDER: Donata Snowman, PA-C  REFERRING DIAG: [phone] (ICD-10-CM) - Spondylosis without myelopathy or radiculopathy, cervical region M47.816 (ICD-10-CM) - Spondylosis without myelopathy or radiculopathy, lumbar region  Rationale for Evaluation and Treatment: Rehabilitation  THERAPY DIAG:  Cervicalgia  Other low back pain  Cramp and spasm  Muscle weakness (generalized)  Abnormal posture  ONSET DATE: 3 mos ago - fell  SUBJECTIVE:                                                                                                                                                                                            SUBJECTIVE STATEMENT: My neck feels slightly better, my back feels about the same.  The DN helped but only for a few hours.    PERTINENT HISTORY:  Lt ankle arthrodesis, PTSD, anxiety, depression  PAIN:   07/27/24 Are you having pain? Yes NPRS scale: 7/10 neck, 8/10 lumbar Pain location: bil neck, central lumbar Pain orientation: Medial and Lower (lumbar), base of skull and down to shoulders bil (cervical) PAIN TYPE: aching, dull, (lumbar) and sharp (neck) Pain description: constant  Aggravating factors: Cervical - sleep (is a stomach sleeper), turning head. Lumbar - sitting Relieving factors: unsure   PRECAUTIONS: None  RED FLAGS: None   WEIGHT BEARING RESTRICTIONS: No  FALLS:  Has patient fallen in last 6 months? Yes. Number of falls 1  LIVING ENVIRONMENT: Lives with: lives with their spouse Lives in: House/apartment Stairs: Yes: Internal: 16 steps; on right going up and on left going up and External: 5 steps; can reach both Has following equipment at home: None  OCCUPATION: retired, swimming at [employer]   PLOF: Independent  PATIENT GOALS: less pain, join his wife at the gym-3 days a week  NEXT MD VISIT: unsure  OBJECTIVE:  Note: Objective measures were completed at Evaluation unless otherwise noted.  DIAGNOSTIC FINDINGS:  Not accessible in chart but Pt reports neck and back have signif arthritis  PATIENT SURVEYS:  Modified Oswestry:  MODIFIED OSWESTRY DISABILITY SCALE  Date: 07/03/24 Score  Pain intensity 3 =  Pain medication provides me with moderate relief from pain.  2. Personal care (washing, dressing, etc.) 1 =  I can take care of myself normally, but it increases my pain.  3. Lifting 4 = I can lift only very light weights  4. Walking 1 = Pain prevents me from walking more than 1 mile.  5. Sitting 3 =  Pain prevents me from sitting more than  hour.  6. Standing 3 =  Pain prevents me from standing more than 1/2 hour.  7.  Sleeping 3 =  Even when I take pain  medication, I sleep less than 4 hours.  8. Social Life 3 =  Pain prevents me from going out very often.  9. Traveling 4 = My pain restricts my travel to short necessary journeys under 1/2 hour.  10. Employment/ Homemaking 2 = I can perform most of my homemaking/job duties, but pain prevents me from performing more physically stressful activities (eg, lifting, vacuuming).  Total 27/50   Interpretation of scores: Score Category Description  0-20% Minimal Disability The patient can cope with most living activities. Usually no treatment is indicated apart from advice on lifting, sitting and exercise  21-40% Moderate Disability The patient experiences more pain and difficulty with sitting, lifting and standing. Travel and social life are more difficult and they may be disabled from work. Personal care, sexual activity and sleeping are not grossly affected, and the patient can usually be managed by conservative means  41-60% Severe Disability Pain remains the main problem in this group, but activities of daily living are affected. These patients require a detailed investigation  61-80% Crippled Back pain impinges on all aspects of the patient's life. Positive intervention is required  81-100% Bed-bound  These patients are either bed-bound or exaggerating their symptoms  Bluford FORBES Zoe DELENA Karon DELENA, et al. Surgery versus conservative management of stable thoracolumbar fracture: the PRESTO feasibility RCT. [place] ([place]): [employer]; 2021 Nov. [facility] Technology Assessment, No. 25.62.) Appendix 3, Oswestry Disability Index category descriptors. Available from: [email or web address].cz  Minimally Clinically Important Difference (MCID) = 12.8%  COGNITION: Overall cognitive status: Pt reports he has had STM challenges lately     SENSATION: Pt reports tingling in bil neck and shoulders  MUSCLE LENGTH: Hamstrings: Right 65 deg; Left  65 deg Quads: limited 25% bil Piriformis: limited 25% bil  POSTURE: No Significant postural limitations  PALPATION: Tender Rt facets L4/5, L5/S1 Tender bil facets C5-C7 Trigger points present: deep cervical multifidi lower cervical spine and upper traps bil  LUMBAR ROM:   AROM eval  Flexion Hands to ankles with LBP  Extension Full with pain  Right lateral flexion Full with Rt pain  Left lateral flexion Full with Rt pain  Right rotation 75% with pain  Left rotation 75% with pain   (Blank rows = not tested)  LOWER EXTREMITY ROM:    Limited end range knee flexion in prone secondary to tight quads Limited end range hip flexion secondary to tight gluteals Good hip IR/ER  LOWER EXTREMITY MMT:   Hips 4/5 bil all planes Poor TA stabilization during LE MMT   GAIT: No major gait deviations  CERVICAL ROM:   Active ROM A/PROM (deg) eval A/ROM 10/27  Flexion 30 45  Extension 30 40  Right lateral flexion 25 40  Left lateral flexion 25 42  Right rotation 40 55  Left rotation 50 60  Pain all motions above  UPPER EXTREMITY ROM: WFL with end range stiffness and pain at full elevation  UPPER EXTREMITY MMT: Cervical: strong and painful when activating Rt musculature Bil UE strength: weak middle and lower traps 4/5 bil    TREATMENT DATE:  07/31/24: NuStep L4 x 6' PT present to discuss status Supine LTR 3x10 each way SL open book x10 each way with top leg on foam roller - some Rt LBP into Rt Rot Supine hooklying neck retraction into towel with overlay of red tband x10 each: horiz abd, D2 flexion, bil ER Supine manual therapy: SO release bil, mob with movement to upper cervical spine with bil neck rotation,  contract/relax at end range bil neck rotation Supine bridge 10x5 Supine 90/90 heel taps x20 Neck ROM measurements updated Seated SNAG for bil neck rotation x10 each way Ice cervical spine x10'  07/27/24: Nustep x 10 min PT present to discuss DN : educated on  principle and precautions Standing hamstring stretch 3 x 30 sec Seated piriformis stretch 3 x 30 sec Prone wing taps x 10 Prone reach and pull x 10 Trigger Point Dry Needling Initial Treatment: Pt instructed on Dry Needling rational, procedures, and possible side effects. Pt instructed to expect mild to moderate muscle soreness later in the day and/or into the next day.  Pt instructed in methods to reduce muscle soreness. Pt instructed to continue prescribed HEP. Because Dry Needling was performed over or adjacent to a lung field, pt was educated on S/S of pneumothorax and to seek immediate medical attention should they occur.  Patient was educated on signs and symptoms of infection and other risk factors and advised to seek medical attention should they occur.  Patient verbalized understanding of these instructions and education.  Patient Verbal Consent Given: Yes Education Handout Provided: Yes Muscles Treated: Lumbar and cervical multifidi, bilateral upper traps and levator scap, bilateral sub occipitals Electrical Stimulation Performed: No Treatment Response/Outcome: Skilled palpation used to identify taut bands and trigger points.  Once identified, dry needling techniques used to treat these areas.  Twitch response ellicited along with palpable elongation of muscle.  Following treatment, patient reported feeling more relaxed   07/11/24: Standing hamstring stretch 3 x 30 sec Standing quad/hip flexor stretch 1 x 30 sec (patient states he does not feel a stretch even with 2 balance pads in chair) Seated piriformis stretch 3 x 30 sec Supine PPT x 10 PPT with 90/90 heel taps x 20 PPT with dying bug x 20 PPT with ball pass 2 x 10 ( had to stop at 6 on first set and finish after resting) MELT for C-spine: rotation x 20, flex/ext x 20, figure 8 x 5 each side  07/03/24: Initiated HEP Discussed DN and cost - Pt ok with OOP cost for this service, has had it before at this clinic                                                                                                                                  PATIENT EDUCATION:  Education details: [id number] Person educated: Patient Education method: Explanation, Demonstration, Verbal cues, and Handouts Education comprehension: verbalized understanding and returned demonstration  HOME EXERCISE PROGRAM: Access Code: [facility] URL: [email or web address] Date: 07/31/2024 Prepared by: Orvil Tarell Schollmeyer  Exercises - Supine Lower Trunk Rotation  - 1 x daily - 7 x weekly - 1 sets - 10 reps - 5 hold - Supine Posterior Pelvic Tilt  - 1 x daily - 7 x weekly - 1 sets - 10 reps - 5 hold - Supine Cervical Retraction with Towel  - 1 x daily - 7  x weekly - 1 sets - 10 reps - 5 hold - Hooklying Single Knee to Chest Stretch  - 1 x daily - 7 x weekly - 1 sets - 3 reps - 20 hold - Supine Piriformis Stretch with Foot on Ground  - 1 x daily - 7 x weekly - 1 sets - 3 reps - 20 hold - Standing Row with Anchored Resistance  - 1 x daily - 7 x weekly - 2 sets - 10 reps - Standing Hamstring Stretch on Chair  - 1 x daily - 7 x weekly - 1 sets - 3 reps - 30 sec hold - Quadricep Stretch with Chair and Counter Support  - 1 x daily - 7 x weekly - 1 sets - 3 reps - 30 sec hold - Seated Figure 4 Piriformis Stretch  - 1 x daily - 7 x weekly - 1 sets - 3 reps - 30 sed hold - Supine 90/90 Alternating Heel Touches with Posterior Pelvic Tilt  - 1 x daily - 7 x weekly - 3 sets - 10 reps - Supine Dead Bug with Leg Extension  - 1 x daily - 7 x weekly - 3 sets - 10 reps - Supine Swiss Ball Handoff  - 1 x daily - 7 x weekly - 3 sets - 10 reps - Sidelying Open Book Thoracic Rotation with Knee on Foam Roll  - 1 x daily - 7 x weekly - 1 sets - 10 reps ASSESSMENT:  CLINICAL IMPRESSION: Rayner reported short lived relief from last session's DN (a few hours of relief).  He arrived with lower pain rating in neck but increased in lumbar spine.  His lumbar spine pain  appears localized to Rt side and neck is bil.  He does demo signif improvement measured in all planes of neck ROM today and has end range pain in all directions.  PT advanced overlay of UE therex with neck stabilization today with good tolerance.  Pt reported soreness after manual therapy so ice used end of session to neck region.  OBJECTIVE IMPAIRMENTS: decreased activity tolerance, decreased mobility, decreased ROM, decreased strength, hypomobility, increased muscle spasms, impaired flexibility, impaired sensation, improper body mechanics, and pain.   ACTIVITY LIMITATIONS: lifting, bending, sitting, standing, squatting, sleeping, and locomotion level  PARTICIPATION LIMITATIONS: cleaning, driving, shopping, community activity, and yard work  PERSONAL FACTORS: 1 comorbidity: Lt ankle arthrodesis are also affecting patient's functional outcome.   REHAB POTENTIAL: Excellent  CLINICAL DECISION MAKING: Evolving/moderate complexity  EVALUATION COMPLEXITY: Moderate   GOALS: Goals reviewed with patient? Yes  SHORT TERM GOALS: Target date: 07/31/24  Pt will be ind with initial HEP Baseline: Goal status: MET 10/27  2.  Pt will report reduced pain with daily activities by at least 25% Baseline:  Goal status: ONGOING 10/27  3.  Pt will be able to sit up to 30 min without increased lumbar pain Baseline:  Goal status: INITIAL  4.  Pt will improve neck rotation to at least 55 deg bil without sharp pain Baseline:  Goal status: MET 10/27  5.  Pt will be able to flex trunk to reach item on ground without increased back pain Baseline:  Goal status: INITIAL    LONG TERM GOALS: Target date: 08/28/24  Pt will be ind with advanced HEP Baseline:  Goal status: INITIAL  2.  Pt will improve ODI to </= 15/50 (30%) to demo reduced disability level Baseline:  Goal status: INITIAL  3.  Pt will be able to drive  without any sharp neck pains when checking blind spots Baseline:  Goal status:  INITIAL  4.  Pt will report improved pain in cervical spine to no greater than 3/10 most days of the week Baseline: 5-8/10 Goal status: INITIAL  5.  Pt will report improved pain in lumbar spine to no greater than 3/10 with daily tasks most days of the week Baseline:  Goal status: INITIAL   PLAN:  PT FREQUENCY: 2x/week  PT DURATION: 8 weeks  PLANNED INTERVENTIONS: 97110-Therapeutic exercises, 97530- Therapeutic activity, 97112- Neuromuscular re-education, 97535- Self Care, 02859- Manual therapy, [phone]- Aquatic Therapy, [id number]- Electrical stimulation (unattended), [id number]- Electrical stimulation (manual), [id number]- Traction (mechanical), 20560 (1-2 muscles), 20561 (3+ muscles)- Dry Needling, Patient/Family education, Taping, Joint mobilization, Spinal mobilization, Cryotherapy, and Moist heat.  PLAN FOR NEXT SESSION: check remaining STGs (met a few already), repeat dry needling to cervical and lumbar spine if Pt interested (only had a few hours of relief with first trial), review and progress HEP for LE and neck stretching, trunk and neck stabilization, hip strength bil  Orvil Fester, PT 07/31/24 3:42 PM   [facility] [address], [address] [place], [place] [address] Phone # [phone] Fax [phone]

## 2024-08-03 ENCOUNTER — Ambulatory Visit

## 2024-08-03 DIAGNOSIS — M542 Cervicalgia: Secondary | ICD-10-CM

## 2024-08-03 DIAGNOSIS — M6281 Muscle weakness (generalized): Secondary | ICD-10-CM

## 2024-08-03 DIAGNOSIS — R293 Abnormal posture: Secondary | ICD-10-CM

## 2024-08-03 DIAGNOSIS — M5459 Other low back pain: Secondary | ICD-10-CM

## 2024-08-03 DIAGNOSIS — R252 Cramp and spasm: Secondary | ICD-10-CM

## 2024-08-03 NOTE — Therapy (Unsigned)
 OUTPATIENT PHYSICAL THERAPY THORACOLUMBAR TREATMENT NOTE   Patient Name: Tom White MRN: [id number] DOB:June 27, 1961, 63 y.o., male Today's Date: 08/04/2024  END OF SESSION:  PT End of Session - 08/03/24 1402     Visit Number 5    Date for Recertification  08/28/24    Authorization Type [employer] Medicare    Progress Note Due on Visit 10    PT Start Time 1402    PT Stop Time 1452    PT Time Calculation (min) 50 min    Activity Tolerance Patient tolerated treatment well    Behavior During Therapy [facility] for tasks assessed/performed           Past Medical History:  Diagnosis Date   Arthritis    had it in my left ankle (05/04/2017)   Environmental and seasonal allergies 06/07/2015   Fibrosis of subtalar joint, 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   Mild cognitive impairment of uncertain or unknown etiology 09/11/2021   Mixed hyperlipidemia 06/14/2016   Pain in left ankle and joints of left foot 06/17/2017   Pain in rectum    Pneumonia 2017   PTSD (post-traumatic stress disorder) 01/30/2020   Slow transit constipation 04/02/2021   Urinary incontinence    Past Surgical History:  Procedure Laterality Date   ANKLE ARTHROSCOPY Left ~ 2013   scraped out arthritis   ANKLE ARTHROSCOPY WITH FUSION Left 09/22/2017   Procedure: LEFT ANKLE POSTERIOR ARTHROSCOPIC SUBTALAR ARTHRODESIS;  Surgeon: Harden Jerona GAILS, MD;  Location: [facility] OR;  Service: Orthopedics;  Laterality: Left;   ANKLE FRACTURE SURGERY Left 12/1980   APPLICATION OF WOUND VAC Right 05/03/2017   foot   APPLICATION OF WOUND  VAC Right 05/03/2017   Procedure: APPLICATION OF WOUND VAC;  Surgeon: Barbarann Oneil BROCKS, MD;  Location: [facility] OR;  Service: Orthopedics;  Laterality: Right;   FRACTURE SURGERY     I & D EXTREMITY Right 05/03/2017   foot   I & D EXTREMITY Right 05/03/2017   Procedure: IRRIGATION AND DEBRIDEMENT EXTREMITY RIGHT, REPAIR OF POSTERIOR TIBIAL TENDON;  Surgeon: Barbarann Oneil BROCKS, MD;  Location: [facility] OR;  Service: Orthopedics;  Laterality: Right;   LAPAROSCOPIC CHOLECYSTECTOMY  1996?   POSTERIOR TIBIAL TENDON REPAIR Right 05/03/2017   SUBTALAR JOINT ARTHROEREISIS Left 1985?   TONSILLECTOMY     Patient Active Problem List   Diagnosis Date Noted   GERD (gastroesophageal reflux disease) 09/11/2021   Mild cognitive impairment of uncertain or unknown etiology 09/11/2021   Urinary incontinence    Pain in rectum    Major depressive disorder    History of concussion    Malignant melanoma of skin of back 04/16/2021   Slow transit constipation 04/02/2021   PTSD (post-traumatic stress disorder) 01/30/2020   Pain in left ankle and joints of left foot 06/17/2017   Fibrosis of subtalar joint, left 01/21/2017   Mixed hyperlipidemia 06/14/2016   Environmental and seasonal allergies 06/07/2015   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   Generalized anxiety disorder 06/07/2015    PCP: Ozell Reilly, MD  REFERRING  PROVIDER: Donata Snowman, PA-C  REFERRING DIAG: [phone] (ICD-10-CM) - Spondylosis without myelopathy or radiculopathy, cervical region M47.816 (ICD-10-CM) - Spondylosis without myelopathy or radiculopathy, lumbar region  Rationale for Evaluation and Treatment: Rehabilitation  THERAPY DIAG:  Cervicalgia  Cramp and spasm  Other low back pain  Abnormal posture  Muscle weakness (generalized)  ONSET DATE: 3 mos ago - fell  SUBJECTIVE:                                                                                                                                                                                            SUBJECTIVE STATEMENT: Patient response to how are you doing.  Patient states its a little worse today   PERTINENT HISTORY:  Lt ankle arthrodesis, PTSD, anxiety, depression  PAIN:   08/03/24 Are you having pain? Yes NPRS scale: 7/10 neck, 8/10 lumbar Pain location: bil neck, central lumbar Pain orientation: Medial and Lower (lumbar), base of skull and down to shoulders bil (cervical) PAIN TYPE: aching, dull, (lumbar) and sharp (neck) Pain description: constant  Aggravating factors: Cervical - sleep (is a stomach sleeper), turning head. Lumbar - sitting Relieving factors: unsure   PRECAUTIONS: None  RED FLAGS: None   WEIGHT BEARING RESTRICTIONS: No  FALLS:  Has patient fallen in last 6 months? Yes. Number of falls 1  LIVING ENVIRONMENT: Lives with: lives with their spouse Lives in: House/apartment Stairs: Yes: Internal: 16 steps; on right going up and on left going up and External: 5 steps; can reach both Has following equipment at home: None  OCCUPATION: retired, swimming at [employer]   PLOF: Independent  PATIENT GOALS: less pain, join his wife at the gym-3 days a week  NEXT MD VISIT: unsure  OBJECTIVE:  Note: Objective measures were completed at Evaluation unless otherwise noted.  DIAGNOSTIC FINDINGS:  Not accessible in chart but Pt reports neck and back have signif arthritis  PATIENT SURVEYS:  Modified Oswestry:  MODIFIED OSWESTRY DISABILITY SCALE  Date: 07/03/24 Score  Pain intensity 3 =  Pain medication provides me with moderate relief from pain.  2. Personal care (washing, dressing, etc.) 1 =  I can take care of myself normally, but it increases my pain.  3. Lifting 4 = I can lift only very light weights  4. Walking 1 = Pain prevents me from walking more than 1 mile.  5. Sitting 3 =  Pain prevents me from sitting more than  hour.  6. Standing 3 =  Pain prevents me from standing more than 1/2 hour.  7. Sleeping 3 =  Even  when I take pain medication, I sleep less than 4 hours.  8. Social Life 3 =  Pain prevents me from going out very often.  9. Traveling 4 = My pain restricts my travel to short necessary journeys under 1/2 hour.  10. Employment/ Homemaking 2 = I can perform most of my homemaking/job duties, but pain prevents me from performing more physically stressful activities (eg, lifting, vacuuming).  Total 27/50   Interpretation of scores: Score Category Description  0-20% Minimal Disability The patient can cope with most living activities. Usually no treatment is indicated apart from advice on lifting, sitting and exercise  21-40% Moderate Disability The patient experiences more pain and difficulty with sitting, lifting and standing. Travel and social life are more difficult and they may be disabled from work. Personal care, sexual activity and sleeping are not grossly affected, and the patient can usually be managed by conservative means  41-60% Severe Disability Pain remains the main problem in this group, but activities of daily living are affected. These patients require a detailed investigation  61-80% Crippled Back pain impinges on all aspects of the patient's life. Positive intervention is required  81-100% Bed-bound  These patients are either bed-bound or exaggerating their symptoms  Bluford FORBES Zoe DELENA Karon DELENA, et al. Surgery versus conservative management of stable thoracolumbar fracture: the PRESTO feasibility RCT. [place] ([place]): [employer]; 2021 Nov. [facility] Technology Assessment, No. 25.62.) Appendix 3, Oswestry Disability Index category descriptors. Available from: Findjewelers.cz  Minimally Clinically Important Difference (MCID) = 12.8%  COGNITION: Overall cognitive status: Pt reports he has had STM challenges lately     SENSATION: Pt reports tingling in bil neck and shoulders  MUSCLE LENGTH: Hamstrings: Right 65 deg; Left 65 deg Quads:  limited 25% bil Piriformis: limited 25% bil  POSTURE: No Significant postural limitations  PALPATION: Tender Rt facets L4/5, L5/S1 Tender bil facets C5-C7 Trigger points present: deep cervical multifidi lower cervical spine and upper traps bil  LUMBAR ROM:   AROM eval  Flexion Hands to ankles with LBP  Extension Full with pain  Right lateral flexion Full with Rt pain  Left lateral flexion Full with Rt pain  Right rotation 75% with pain  Left rotation 75% with pain   (Blank rows = not tested)  LOWER EXTREMITY ROM:    Limited end range knee flexion in prone secondary to tight quads Limited end range hip flexion secondary to tight gluteals Good hip IR/ER  LOWER EXTREMITY MMT:   Hips 4/5 bil all planes Poor TA stabilization during LE MMT   GAIT: No major gait deviations  CERVICAL ROM:   Active ROM A/PROM (deg) eval A/ROM 10/27  Flexion 30 45  Extension 30 40  Right lateral flexion 25 40  Left lateral flexion 25 42  Right rotation 40 55  Left rotation 50 60  Pain all motions above  UPPER EXTREMITY ROM: WFL with end range stiffness and pain at full elevation  UPPER EXTREMITY MMT: Cervical: strong and painful when activating Rt musculature Bil UE strength: weak middle and lower traps 4/5 bil    TREATMENT DATE:  08/03/24: UBE x 6'  (3/3) PT present to discuss status 3 way scapular stabilization with blue loop x 5 each side 4D ball rolls x 20 each direction on each UE 25 lb teal serious steel band chest press (hug the tree) 2 x 10 then serratus punch 2 x 10 Standing bilateral shoulder ER with red tband 2 x 10 Standing shoulder horizontal abduction 2 x 10 with red tband Standing single arm ER with blue  tband with handles 2 x 10 Prone wing taps 2 x 10 Prone reach and pull 2 x 10 MELT: c spine on blue foam roller : x 20 rotation, flex/ext x 20, figure 8's x 10 each side Ice cervical spine x10'  07/31/24: NuStep L4 x 6' PT present to discuss status Supine  LTR 3x10 each way SL open book x10 each way with top leg on foam roller - some Rt LBP into Rt Rot Supine hooklying neck retraction into towel with overlay of red tband x10 each: horiz abd, D2 flexion, bil ER Supine manual therapy: SO release bil, mob with movement to upper cervical spine with bil neck rotation, contract/relax at end range bil neck rotation Supine bridge 10x5 Supine 90/90 heel taps x20 Neck ROM measurements updated Seated SNAG for bil neck rotation x10 each way Ice cervical spine x10'  07/27/24: Nustep x 10 min PT present to discuss DN : educated on principle and precautions Standing hamstring stretch 3 x 30 sec Seated piriformis stretch 3 x 30 sec Prone wing taps x 10 Prone reach and pull x 10 Trigger Point Dry Needling Initial Treatment: Pt instructed on Dry Needling rational, procedures, and possible side effects. Pt instructed to expect mild to moderate muscle soreness later in the day and/or into the next day.  Pt instructed in methods to reduce muscle soreness. Pt instructed to continue prescribed HEP. Because Dry Needling was performed over or adjacent to a lung field, pt was educated on S/S of pneumothorax and to seek immediate medical attention should they occur.  Patient was educated on signs and symptoms of infection and other risk factors and advised to seek medical attention should they occur.  Patient verbalized understanding of these instructions and education.  Patient Verbal Consent Given: Yes Education Handout Provided: Yes Muscles Treated: Lumbar and cervical multifidi, bilateral upper traps and levator scap, bilateral sub occipitals Electrical Stimulation Performed: No Treatment Response/Outcome: Skilled palpation used to identify taut bands and trigger points.  Once identified, dry needling techniques used to treat these areas.  Twitch response ellicited along with palpable elongation of muscle.  Following treatment, patient reported feeling more  relaxed   07/03/24: Initiated HEP Discussed DN and cost - Pt ok with OOP cost for this service, has had it before at this clinic                                                                                                                                 PATIENT EDUCATION:  Education details: [email or web address] Person educated: Patient Education method: Explanation, Demonstration, Verbal cues, and Handouts Education comprehension: verbalized understanding and returned demonstration  HOME EXERCISE PROGRAM: Access Code: [email or web address] URL: [email or web address] Date: 07/31/2024 Prepared by: Orvil Beuhring  Exercises - Supine Lower Trunk Rotation  - 1 x daily - 7 x weekly - 1 sets - 10 reps - 5 hold - Supine Posterior Pelvic Tilt  - 1 x daily - 7 x  weekly - 1 sets - 10 reps - 5 hold - Supine Cervical Retraction with Towel  - 1 x daily - 7 x weekly - 1 sets - 10 reps - 5 hold - Hooklying Single Knee to Chest Stretch  - 1 x daily - 7 x weekly - 1 sets - 3 reps - 20 hold - Supine Piriformis Stretch with Foot on Ground  - 1 x daily - 7 x weekly - 1 sets - 3 reps - 20 hold - Standing Row with Anchored Resistance  - 1 x daily - 7 x weekly - 2 sets - 10 reps - Standing Hamstring Stretch on Chair  - 1 x daily - 7 x weekly - 1 sets - 3 reps - 30 sec hold - Quadricep Stretch with Chair and Counter Support  - 1 x daily - 7 x weekly - 1 sets - 3 reps - 30 sec hold - Seated Figure 4 Piriformis Stretch  - 1 x daily - 7 x weekly - 1 sets - 3 reps - 30 sed hold - Supine 90/90 Alternating Heel Touches with Posterior Pelvic Tilt  - 1 x daily - 7 x weekly - 3 sets - 10 reps - Supine Dead Bug with Leg Extension  - 1 x daily - 7 x weekly - 3 sets - 10 reps - Supine Swiss Ball Handoff  - 1 x daily - 7 x weekly - 3 sets - 10 reps - Sidelying Open Book Thoracic Rotation with Knee on Foam Roll  - 1 x daily - 7 x weekly - 1 sets - 10 reps ASSESSMENT:  CLINICAL IMPRESSION: Selig arrived stating he's a  little worse today.  We focused heavily on shoulder and postural strength.  He continues to be quite atrophied in the parascapular musculature and left shoulder compensation still evident.  He is typically able to do high level strengthening and denies pain throughout.  However, he typically returns to PT with c/o no change or elevated pain.  Ice used end of session to neck region to avoid pain.   OBJECTIVE IMPAIRMENTS: decreased activity tolerance, decreased mobility, decreased ROM, decreased strength, hypomobility, increased muscle spasms, impaired flexibility, impaired sensation, improper body mechanics, and pain.   ACTIVITY LIMITATIONS: lifting, bending, sitting, standing, squatting, sleeping, and locomotion level  PARTICIPATION LIMITATIONS: cleaning, driving, shopping, community activity, and yard work  PERSONAL FACTORS: 1 comorbidity: Lt ankle arthrodesis are also affecting patient's functional outcome.   REHAB POTENTIAL: Excellent  CLINICAL DECISION MAKING: Evolving/moderate complexity  EVALUATION COMPLEXITY: Moderate   GOALS: Goals reviewed with patient? Yes  SHORT TERM GOALS: Target date: 07/31/24  Pt will be ind with initial HEP Baseline: Goal status: MET 10/27  2.  Pt will report reduced pain with daily activities by at least 25% Baseline:  Goal status: ONGOING 10/27  3.  Pt will be able to sit up to 30 min without increased lumbar pain Baseline: 08/03/24 can do 10-15 min Goal status: In progress  4.  Pt will improve neck rotation to at least 55 deg bil without sharp pain Baseline:  Goal status: MET 10/27  5.  Pt will be able to flex trunk to reach item on ground without increased back pain Baseline:  Goal status: INITIAL    LONG TERM GOALS: Target date: 08/28/24  Pt will be ind with advanced HEP Baseline:  Goal status: INITIAL  2.  Pt will improve ODI to </= 15/50 (30%) to demo reduced disability level Baseline:  Goal status: INITIAL  3.  Pt will be  able to drive without any sharp neck pains when checking blind spots Baseline:  Goal status: INITIAL  4.  Pt will report improved pain in cervical spine to no greater than 3/10 most days of the week Baseline: 5-8/10 Goal status: INITIAL  5.  Pt will report improved pain in lumbar spine to no greater than 3/10 with daily tasks most days of the week Baseline:  Goal status: INITIAL   PLAN:  PT FREQUENCY: 2x/week  PT DURATION: 8 weeks  PLANNED INTERVENTIONS: 97110-Therapeutic exercises, 97530- Therapeutic activity, 97112- Neuromuscular re-education, 97535- Self Care, [id number]- Manual therapy, [phone]- Aquatic Therapy, [id number]- Electrical stimulation (unattended), [phone]- Electrical stimulation (manual), [id number]- Traction (mechanical), 20560 (1-2 muscles), 20561 (3+ muscles)- Dry Needling, Patient/Family education, Taping, Joint mobilization, Spinal mobilization, Cryotherapy, and Moist heat.  PLAN FOR NEXT SESSION: Repeat dry needling to cervical and lumbar spine if Pt interested (only had a few hours of relief with first trial), postural and core stabilization, left shoulder IR/ER strengthening, review and progress HEP for LE and neck stretching, trunk and neck stabilization, hip strength bil  Miroslava Santellan B. Thais Silberstein, PT 08/04/24 7:26 AM [facility] [address], [address] [place], [place] [address] Phone # [phone] Fax [phone]

## 2024-08-07 ENCOUNTER — Ambulatory Visit: Attending: Physician Assistant | Admitting: Physical Therapy

## 2024-08-07 ENCOUNTER — Encounter: Payer: Self-pay | Admitting: Physical Therapy

## 2024-08-07 DIAGNOSIS — R262 Difficulty in walking, not elsewhere classified: Secondary | ICD-10-CM | POA: Diagnosis present

## 2024-08-07 DIAGNOSIS — M542 Cervicalgia: Secondary | ICD-10-CM | POA: Diagnosis present

## 2024-08-07 DIAGNOSIS — M5459 Other low back pain: Secondary | ICD-10-CM | POA: Insufficient documentation

## 2024-08-07 DIAGNOSIS — R293 Abnormal posture: Secondary | ICD-10-CM | POA: Diagnosis present

## 2024-08-07 DIAGNOSIS — R252 Cramp and spasm: Secondary | ICD-10-CM | POA: Diagnosis present

## 2024-08-07 DIAGNOSIS — M25612 Stiffness of left shoulder, not elsewhere classified: Secondary | ICD-10-CM | POA: Insufficient documentation

## 2024-08-07 DIAGNOSIS — M6281 Muscle weakness (generalized): Secondary | ICD-10-CM | POA: Diagnosis present

## 2024-08-07 NOTE — Therapy (Signed)
 OUTPATIENT PHYSICAL THERAPY THORACOLUMBAR TREATMENT NOTE   Patient Name: Tom White MRN: [id number] DOB:Jun 21, 1961, 63 y.o., male Today's Date: 08/07/2024  END OF SESSION:  PT End of Session - 08/07/24 1405     Visit Number 6    Date for Recertification  08/28/24    Authorization Type [employer] Medicare    Progress Note Due on Visit 10    PT Start Time 1402    PT Stop Time 1450    PT Time Calculation (min) 48 min    Activity Tolerance Patient tolerated treatment well    Behavior During Therapy [facility] for tasks assessed/performed            Past Medical History:  Diagnosis Date   Arthritis    had it in my left ankle (05/04/2017)   Environmental and seasonal allergies 06/07/2015   Fibrosis of subtalar joint, 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   Mild cognitive impairment of uncertain or unknown etiology 09/11/2021   Mixed hyperlipidemia 06/14/2016   Pain in left ankle and joints of left foot 06/17/2017   Pain in rectum    Pneumonia 2017   PTSD (post-traumatic stress disorder) 01/30/2020   Slow transit constipation 04/02/2021   Urinary incontinence    Past Surgical History:  Procedure Laterality Date   ANKLE ARTHROSCOPY Left ~ 2013   scraped out arthritis   ANKLE ARTHROSCOPY WITH FUSION Left 09/22/2017   Procedure: LEFT ANKLE POSTERIOR ARTHROSCOPIC SUBTALAR ARTHRODESIS;  Surgeon: Harden Jerona GAILS, MD;  Location: [facility] OR;  Service: Orthopedics;  Laterality: Left;   ANKLE FRACTURE SURGERY Left 12/1980   APPLICATION OF WOUND VAC Right 05/03/2017   foot   APPLICATION OF WOUND  VAC Right 05/03/2017   Procedure: APPLICATION OF WOUND VAC;  Surgeon: Barbarann Oneil BROCKS, MD;  Location: [facility] OR;  Service: Orthopedics;  Laterality: Right;   FRACTURE SURGERY     I & D EXTREMITY Right 05/03/2017   foot   I & D EXTREMITY Right 05/03/2017   Procedure: IRRIGATION AND DEBRIDEMENT EXTREMITY RIGHT, REPAIR OF POSTERIOR TIBIAL TENDON;  Surgeon: Barbarann Oneil BROCKS, MD;  Location: [facility] OR;  Service: Orthopedics;  Laterality: Right;   LAPAROSCOPIC CHOLECYSTECTOMY  1996?   POSTERIOR TIBIAL TENDON REPAIR Right 05/03/2017   SUBTALAR JOINT ARTHROEREISIS Left 1985?   TONSILLECTOMY     Patient Active Problem List   Diagnosis Date Noted   GERD (gastroesophageal reflux disease) 09/11/2021   Mild cognitive impairment of uncertain or unknown etiology 09/11/2021   Urinary incontinence    Pain in rectum    Major depressive disorder    History of concussion    Malignant melanoma of skin of back 04/16/2021   Slow transit constipation 04/02/2021   PTSD (post-traumatic stress disorder) 01/30/2020   Pain in left ankle and joints of left foot 06/17/2017   Fibrosis of subtalar joint, left 01/21/2017   Mixed hyperlipidemia 06/14/2016   Environmental and seasonal allergies 06/07/2015   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   Generalized anxiety disorder 06/07/2015    PCP: Ozell Reilly, MD  REFERRING PROVIDER: Shepperson, Kirstin, PA-C  REFERRING DIAG: [phone] (ICD-10-CM) - Spondylosis without myelopathy or radiculopathy, cervical region M47.816 (ICD-10-CM) - Spondylosis without myelopathy or radiculopathy, lumbar region  Rationale for Evaluation and Treatment: Rehabilitation  THERAPY DIAG:  Cervicalgia  Cramp and spasm  Other low back pain  Abnormal posture  Muscle weakness (generalized)  ONSET DATE: 3 mos ago - fell  SUBJECTIVE:                                                                                                                                                                                            SUBJECTIVE STATEMENT: Overall I am doing better.  Less pain.  PERTINENT HISTORY:  Lt ankle arthrodesis, PTSD, anxiety, depression  PAIN:   08/03/24 Are you having pain? Yes NPRS scale: 7/10 neck, 6/10 lumbar Pain location: bil neck, central lumbar Pain orientation: Medial and Lower (lumbar), base of skull and down to shoulders bil (cervical) PAIN TYPE: aching, dull, (lumbar) and sharp (neck) Pain description: constant  Aggravating factors: Cervical - sleep (is a stomach sleeper), turning head. Lumbar - sitting Relieving factors: unsure   PRECAUTIONS: None  RED FLAGS: None   WEIGHT BEARING RESTRICTIONS: No  FALLS:  Has patient fallen in last 6 months? Yes. Number of falls 1  LIVING ENVIRONMENT: Lives with: lives with their spouse Lives in: House/apartment Stairs: Yes: Internal: 16 steps; on right going up and on left going up and External: 5 steps; can reach both Has following equipment at home: None  OCCUPATION: retired, swimming at [employer]   PLOF: Independent  PATIENT GOALS: less pain, join his wife at the gym-3 days a week  NEXT MD VISIT: unsure  OBJECTIVE:  Note: Objective measures were completed at Evaluation unless otherwise noted.  DIAGNOSTIC FINDINGS:  Not accessible in chart but Pt reports neck and back have signif arthritis  PATIENT SURVEYS:  Modified Oswestry:  MODIFIED OSWESTRY DISABILITY SCALE  Date: 07/03/24 Score  Pain intensity 3 =  Pain medication provides me with moderate relief from pain.  2. Personal care (washing, dressing, etc.) 1 =  I can take care of myself normally, but it increases my pain.  3. Lifting 4 = I can lift only very light weights  4. Walking 1 = Pain prevents me from walking more than 1 mile.  5. Sitting 3 =  Pain prevents me from sitting more than  hour.  6. Standing 3 =  Pain prevents me from standing more than 1/2 hour.  7. Sleeping 3 =  Even when I take pain medication, I sleep less than 4  hours.  8. Social Life 3 =  Pain prevents me from going out very often.  9. Traveling 4 = My pain restricts my travel to short necessary journeys under 1/2 hour.  10. Employment/ Homemaking 2 = I can perform most of my homemaking/job duties, but pain prevents me from performing more physically stressful activities (eg, lifting, vacuuming).  Total 27/50   Interpretation of scores: Score Category Description  0-20% Minimal Disability The patient can cope with most living activities. Usually no treatment is indicated apart from advice on lifting, sitting and exercise  21-40% Moderate Disability The patient experiences more pain and difficulty with sitting, lifting and standing. Travel and social life are more difficult and they may be disabled from work. Personal care, sexual activity and sleeping are not grossly affected, and the patient can usually be managed by conservative means  41-60% Severe Disability Pain remains the main problem in this group, but activities of daily living are affected. These patients require a detailed investigation  61-80% Crippled Back pain impinges on all aspects of the patient's life. Positive intervention is required  81-100% Bed-bound  These patients are either bed-bound or exaggerating their symptoms  Bluford FORBES Zoe DELENA Karon DELENA, et al. Surgery versus conservative management of stable thoracolumbar fracture: the PRESTO feasibility RCT. [place] ([place]): [employer]; 2021 Nov. [facility] Technology Assessment, No. 25.62.) Appendix 3, Oswestry Disability Index category descriptors. Available from: Findjewelers.cz  Minimally Clinically Important Difference (MCID) = 12.8%  COGNITION: Overall cognitive status: Pt reports he has had STM challenges lately     SENSATION: Pt reports tingling in bil neck and shoulders  MUSCLE LENGTH: Hamstrings: Right 65 deg; Left 65 deg Quads: limited 25% bil Piriformis: limited 25%  bil  POSTURE: No Significant postural limitations  PALPATION: Tender Rt facets L4/5, L5/S1 Tender bil facets C5-C7 Trigger points present: deep cervical multifidi lower cervical spine and upper traps bil  LUMBAR ROM:   AROM eval  Flexion Hands to ankles with LBP  Extension Full with pain  Right lateral flexion Full with Rt pain  Left lateral flexion Full with Rt pain  Right rotation 75% with pain  Left rotation 75% with pain   (Blank rows = not tested)  LOWER EXTREMITY ROM:    Limited end range knee flexion in prone secondary to tight quads Limited end range hip flexion secondary to tight gluteals Good hip IR/ER  LOWER EXTREMITY MMT:   Hips 4/5 bil all planes Poor TA stabilization during LE MMT   GAIT: No major gait deviations  CERVICAL ROM:   Active ROM A/PROM (deg) eval A/ROM 10/27  Flexion 30 45  Extension 30 40  Right lateral flexion 25 40  Left lateral flexion 25 42  Right rotation 40 55  Left rotation 50 60  Pain all motions above  UPPER EXTREMITY ROM: WFL with end range stiffness and pain at full elevation  UPPER EXTREMITY MMT: Cervical: strong and painful when activating Rt musculature Bil UE strength: weak middle and lower traps 4/5 bil    TREATMENT DATE:  08/07/24 NuStep L7 x 6' PT present to discuss status 3 way scapular stabilization with blue loop x 5 each side 4D ball rolls x 20 each direction on each UE Neck retraction into red tband standing holding band against wall Red tband horiz abd, D2 flexion red tband 2x10 each Standing single arm ER with blue tband with handles 1 x 10 (shoulder fatigue/burning) Modified deadlift 10lb from 8 box 2x10 Farmer carry 10lb 1 lap around ortho gym holding on each side  of trunk Prone wing taps 2 x 10 Prone reach and pull 2 x 10 Supine MELT - Pt reported the full size foam roller pushes his neck too far forward so we used half foam which was more comfortable Ice with 1/2 foam roller  x10'  08/03/24: UBE x 6'  (3/3) PT present to discuss status 3 way scapular stabilization with blue loop x 5 each side 4D ball rolls x 20 each direction on each UE 25 lb teal serious steel band chest press (hug the tree) 2 x 10 then serratus punch 2 x 10 Standing bilateral shoulder ER with red tband 2 x 10 Standing shoulder horizontal abduction 2 x 10 with red tband Standing single arm ER with blue tband with handles 2 x 10 Prone wing taps 2 x 10 Prone reach and pull 2 x 10 MELT: c spine on blue foam roller : x 20 rotation, flex/ext x 20, figure 8's x 10 each side Ice cervical spine x10'  07/31/24: NuStep L4 x 6' PT present to discuss status Supine LTR 3x10 each way SL open book x10 each way with top leg on foam roller - some Rt LBP into Rt Rot Supine hooklying neck retraction into towel with overlay of red tband x10 each: horiz abd, D2 flexion, bil ER Supine manual therapy: SO release bil, mob with movement to upper cervical spine with bil neck rotation, contract/relax at end range bil neck rotation Supine bridge 10x5 Supine 90/90 heel taps x20 Neck ROM measurements updated Seated SNAG for bil neck rotation x10 each way Ice cervical spine x10'                                                             PATIENT EDUCATION:  Education details: [email or web address] Person educated: Patient Education method: Explanation, Demonstration, Verbal cues, and Handouts Education comprehension: verbalized understanding and returned demonstration  HOME EXERCISE PROGRAM: Access Code: [email or web address] URL: [email or web address] Date: 07/31/2024 Prepared by: Orvil Azaliah Carrero  Exercises - Supine Lower Trunk Rotation  - 1 x daily - 7 x weekly - 1 sets - 10 reps - 5 hold - Supine Posterior Pelvic Tilt  - 1 x daily - 7 x weekly - 1 sets - 10 reps - 5 hold - Supine Cervical Retraction with Towel  - 1 x daily - 7 x weekly - 1 sets - 10 reps - 5 hold - Hooklying Single Knee to Chest Stretch  - 1 x  daily - 7 x weekly - 1 sets - 3 reps - 20 hold - Supine Piriformis Stretch with Foot on Ground  - 1 x daily - 7 x weekly - 1 sets - 3 reps - 20 hold - Standing Row with Anchored Resistance  - 1 x daily - 7 x weekly - 2 sets - 10 reps - Standing Hamstring Stretch on Chair  - 1 x daily - 7 x weekly - 1 sets - 3 reps - 30 sec hold - Quadricep Stretch with Chair and Counter Support  - 1 x daily - 7 x weekly - 1 sets - 3 reps - 30 sec hold - Seated Figure 4 Piriformis Stretch  - 1 x daily - 7 x weekly - 1 sets - 3 reps - 30 sed hold - Supine 90/90 Alternating Heel Touches with  Posterior Pelvic Tilt  - 1 x daily - 7 x weekly - 3 sets - 10 reps - Supine Dead Bug with Leg Extension  - 1 x daily - 7 x weekly - 3 sets - 10 reps - Supine Swiss Ball Handoff  - 1 x daily - 7 x weekly - 3 sets - 10 reps - Sidelying Open Book Thoracic Rotation with Knee on Foam Roll  - 1 x daily - 7 x weekly - 1 sets - 10 reps ASSESSMENT:  CLINICAL IMPRESSION: Pt reports he is feeling less pain overall but still reports 7/10 for neck pain.We continued to focus heavily on shoulder and postural strength to address atrophied parascapular musculature.  Left shoulder compensation still evident. He was able to do high level strengthening and denied pain throughout - just noted bil shoulder burning fatigue with progressed therex.  Switched from full to 1/2 size foam roller for MELT today for improved ability to relax over that size.  OBJECTIVE IMPAIRMENTS: decreased activity tolerance, decreased mobility, decreased ROM, decreased strength, hypomobility, increased muscle spasms, impaired flexibility, impaired sensation, improper body mechanics, and pain.   ACTIVITY LIMITATIONS: lifting, bending, sitting, standing, squatting, sleeping, and locomotion level  PARTICIPATION LIMITATIONS: cleaning, driving, shopping, community activity, and yard work  PERSONAL FACTORS: 1 comorbidity: Lt ankle arthrodesis are also affecting patient's  functional outcome.   REHAB POTENTIAL: Excellent  CLINICAL DECISION MAKING: Evolving/moderate complexity  EVALUATION COMPLEXITY: Moderate   GOALS: Goals reviewed with patient? Yes  SHORT TERM GOALS: Target date: 07/31/24  Pt will be ind with initial HEP Baseline: Goal status: MET 10/27  2.  Pt will report reduced pain with daily activities by at least 25% Baseline:  Goal status: ONGOING 10/27  3.  Pt will be able to sit up to 30 min without increased lumbar pain Baseline: 08/03/24 can do 10-15 min Goal status: In progress  4.  Pt will improve neck rotation to at least 55 deg bil without sharp pain Baseline:  Goal status: MET 10/27  5.  Pt will be able to flex trunk to reach item on ground without increased back pain Baseline:  Goal status: INITIAL    LONG TERM GOALS: Target date: 08/28/24  Pt will be ind with advanced HEP Baseline:  Goal status: INITIAL  2.  Pt will improve ODI to </= 15/50 (30%) to demo reduced disability level Baseline:  Goal status: INITIAL  3.  Pt will be able to drive without any sharp neck pains when checking blind spots Baseline:  Goal status: INITIAL  4.  Pt will report improved pain in cervical spine to no greater than 3/10 most days of the week Baseline: 5-8/10 Goal status: INITIAL  5.  Pt will report improved pain in lumbar spine to no greater than 3/10 with daily tasks most days of the week Baseline:  Goal status: INITIAL   PLAN:  PT FREQUENCY: 2x/week  PT DURATION: 8 weeks  PLANNED INTERVENTIONS: 97110-Therapeutic exercises, 97530- Therapeutic activity, 97112- Neuromuscular re-education, 97535- Self Care, [id number]- Manual therapy, [phone]- Aquatic Therapy, [id number]- Electrical stimulation (unattended), [phone]- Electrical stimulation (manual), [id number]- Traction (mechanical), 20560 (1-2 muscles), 20561 (3+ muscles)- Dry Needling, Patient/Family education, Taping, Joint mobilization, Spinal mobilization, Cryotherapy, and Moist  heat.  PLAN FOR NEXT SESSION: postural and core stabilization, left shoulder IR/ER strengthening, review and progress HEP for LE and neck stretching, trunk and neck stabilization, hip strength bil  Orvil Fester, PT 08/07/24 2:44 PM  [facility] [address] [place],  [place] [address] Phone # [phone] Fax [phone]

## 2024-08-10 ENCOUNTER — Ambulatory Visit

## 2024-08-10 DIAGNOSIS — R293 Abnormal posture: Secondary | ICD-10-CM

## 2024-08-10 DIAGNOSIS — M5459 Other low back pain: Secondary | ICD-10-CM

## 2024-08-10 DIAGNOSIS — M6281 Muscle weakness (generalized): Secondary | ICD-10-CM

## 2024-08-10 DIAGNOSIS — M25612 Stiffness of left shoulder, not elsewhere classified: Secondary | ICD-10-CM

## 2024-08-10 DIAGNOSIS — M542 Cervicalgia: Secondary | ICD-10-CM

## 2024-08-10 DIAGNOSIS — R262 Difficulty in walking, not elsewhere classified: Secondary | ICD-10-CM

## 2024-08-10 DIAGNOSIS — R252 Cramp and spasm: Secondary | ICD-10-CM

## 2024-08-10 NOTE — Therapy (Signed)
 OUTPATIENT PHYSICAL THERAPY THORACOLUMBAR TREATMENT NOTE   Patient Name: Tom White MRN: [id number] DOB:1961/07/18, 63 y.o., male Today's Date: 08/10/2024  END OF SESSION:  PT End of Session - 08/10/24 1404     Visit Number 7    Date for Recertification  08/28/24    Authorization Type [employer] Medicare    Progress Note Due on Visit 10    PT Start Time 1400    PT Stop Time 1445    PT Time Calculation (min) 45 min    Activity Tolerance Patient tolerated treatment well    Behavior During Therapy [facility] for tasks assessed/performed            Past Medical History:  Diagnosis Date   Arthritis    had it in my left ankle (05/04/2017)   Environmental and seasonal allergies 06/07/2015   Fibrosis of subtalar joint, 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   Mild cognitive impairment of uncertain or unknown etiology 09/11/2021   Mixed hyperlipidemia 06/14/2016   Pain in left ankle and joints of left foot 06/17/2017   Pain in rectum    Pneumonia 2017   PTSD (post-traumatic stress disorder) 01/30/2020   Slow transit constipation 04/02/2021   Urinary incontinence    Past Surgical History:  Procedure Laterality Date   ANKLE ARTHROSCOPY Left ~ 2013   scraped out arthritis   ANKLE ARTHROSCOPY WITH FUSION Left 09/22/2017   Procedure: LEFT ANKLE POSTERIOR ARTHROSCOPIC SUBTALAR ARTHRODESIS;  Surgeon: Harden Jerona GAILS, MD;  Location: [facility] OR;  Service: Orthopedics;  Laterality: Left;   ANKLE FRACTURE SURGERY Left 12/1980   APPLICATION OF WOUND VAC Right 05/03/2017   foot   APPLICATION OF WOUND  VAC Right 05/03/2017   Procedure: APPLICATION OF WOUND VAC;  Surgeon: Barbarann Oneil BROCKS, MD;  Location: [facility] OR;  Service: Orthopedics;  Laterality: Right;   FRACTURE SURGERY     I & D EXTREMITY Right 05/03/2017   foot   I & D EXTREMITY Right 05/03/2017   Procedure: IRRIGATION AND DEBRIDEMENT EXTREMITY RIGHT, REPAIR OF POSTERIOR TIBIAL TENDON;  Surgeon: Barbarann Oneil BROCKS, MD;  Location: [facility] OR;  Service: Orthopedics;  Laterality: Right;   LAPAROSCOPIC CHOLECYSTECTOMY  1996?   POSTERIOR TIBIAL TENDON REPAIR Right 05/03/2017   SUBTALAR JOINT ARTHROEREISIS Left 1985?   TONSILLECTOMY     Patient Active Problem List   Diagnosis Date Noted   GERD (gastroesophageal reflux disease) 09/11/2021   Mild cognitive impairment of uncertain or unknown etiology 09/11/2021   Urinary incontinence    Pain in rectum    Major depressive disorder    History of concussion    Malignant melanoma of skin of back 04/16/2021   Slow transit constipation 04/02/2021   PTSD (post-traumatic stress disorder) 01/30/2020   Pain in left ankle and joints of left foot 06/17/2017   Fibrosis of subtalar joint, left 01/21/2017   Mixed hyperlipidemia 06/14/2016   Environmental and seasonal allergies 06/07/2015   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   Generalized anxiety disorder 06/07/2015    PCP: Ozell Reilly, MD  REFERRING PROVIDER: Shepperson, Kirstin, PA-C  REFERRING DIAG: [phone] (ICD-10-CM) - Spondylosis without myelopathy or radiculopathy, cervical region M47.816 (ICD-10-CM) - Spondylosis without myelopathy or radiculopathy, lumbar region  Rationale for Evaluation and Treatment: Rehabilitation  THERAPY DIAG:  Cervicalgia  Cramp and spasm  Other low back pain  Abnormal posture  Muscle weakness (generalized)  Difficulty in walking, not elsewhere classified  Stiffness of left shoulder, not elsewhere classified  ONSET DATE: 3 mos ago - fell  SUBJECTIVE:                                                                                                                                                                                            SUBJECTIVE STATEMENT: My neck is killing me and my back hurts.    PERTINENT HISTORY:  Lt ankle arthrodesis, PTSD, anxiety, depression  PAIN:   08/03/24 Are you having pain? Yes NPRS scale: 7/10 neck, 6/10 lumbar Pain location: bil neck, central lumbar Pain orientation: Medial and Lower (lumbar), base of skull and down to shoulders bil (cervical) PAIN TYPE: aching, dull, (lumbar) and sharp (neck) Pain description: constant  Aggravating factors: Cervical - sleep (is a stomach sleeper), turning head. Lumbar - sitting Relieving factors: unsure   PRECAUTIONS: None  RED FLAGS: None   WEIGHT BEARING RESTRICTIONS: No  FALLS:  Has patient fallen in last 6 months? Yes. Number of falls 1  LIVING ENVIRONMENT: Lives with: lives with their spouse Lives in: House/apartment Stairs: Yes: Internal: 16 steps; on right going up and on left going up and External: 5 steps; can reach both Has following equipment at home: None  OCCUPATION: retired, swimming at [employer]   PLOF: Independent  PATIENT GOALS: less pain, join his wife at the gym-3 days a week  NEXT MD VISIT: unsure  OBJECTIVE:  Note: Objective measures were completed at Evaluation unless otherwise noted.  DIAGNOSTIC FINDINGS:  Not accessible in chart but Pt reports neck and back have signif arthritis  PATIENT SURVEYS:  Modified Oswestry:  MODIFIED OSWESTRY DISABILITY SCALE  Date: 07/03/24 Score  Pain intensity 3 =  Pain medication provides me with moderate relief from pain.  2. Personal care (washing, dressing, etc.) 1 =  I can take care of myself normally, but it increases my pain.  3. Lifting 4 = I can lift only very light weights  4. Walking 1 = Pain prevents me from walking more than 1 mile.  5. Sitting 3 =  Pain prevents me from sitting more than  hour.  6. Standing 3 =  Pain  prevents me from standing more than 1/2 hour.  7. Sleeping 3 =  Even  when I take pain medication, I sleep less than 4 hours.  8. Social Life 3 =  Pain prevents me from going out very often.  9. Traveling 4 = My pain restricts my travel to short necessary journeys under 1/2 hour.  10. Employment/ Homemaking 2 = I can perform most of my homemaking/job duties, but pain prevents me from performing more physically stressful activities (eg, lifting, vacuuming).  Total 27/50   Interpretation of scores: Score Category Description  0-20% Minimal Disability The patient can cope with most living activities. Usually no treatment is indicated apart from advice on lifting, sitting and exercise  21-40% Moderate Disability The patient experiences more pain and difficulty with sitting, lifting and standing. Travel and social life are more difficult and they may be disabled from work. Personal care, sexual activity and sleeping are not grossly affected, and the patient can usually be managed by conservative means  41-60% Severe Disability Pain remains the main problem in this group, but activities of daily living are affected. These patients require a detailed investigation  61-80% Crippled Back pain impinges on all aspects of the patient's life. Positive intervention is required  81-100% Bed-bound  These patients are either bed-bound or exaggerating their symptoms  Bluford FORBES Zoe DELENA Karon DELENA, et al. Surgery versus conservative management of stable thoracolumbar fracture: the PRESTO feasibility RCT. [place] ([place]): [employer]; 2021 Nov. [facility] Technology Assessment, No. 25.62.) Appendix 3, Oswestry Disability Index category descriptors. Available from: [email or web address].cz  Minimally Clinically Important Difference (MCID) = 12.8%  COGNITION: Overall cognitive status: Pt reports he has had STM challenges lately     SENSATION: Pt reports tingling in bil neck and  shoulders  MUSCLE LENGTH: Hamstrings: Right 65 deg; Left 65 deg Quads: limited 25% bil Piriformis: limited 25% bil  POSTURE: No Significant postural limitations  PALPATION: Tender Rt facets L4/5, L5/S1 Tender bil facets C5-C7 Trigger points present: deep cervical multifidi lower cervical spine and upper traps bil  LUMBAR ROM:   AROM eval  Flexion Hands to ankles with LBP  Extension Full with pain  Right lateral flexion Full with Rt pain  Left lateral flexion Full with Rt pain  Right rotation 75% with pain  Left rotation 75% with pain   (Blank rows = not tested)  LOWER EXTREMITY ROM:    Limited end range knee flexion in prone secondary to tight quads Limited end range hip flexion secondary to tight gluteals Good hip IR/ER  LOWER EXTREMITY MMT:   Hips 4/5 bil all planes Poor TA stabilization during LE MMT   GAIT: No major gait deviations  CERVICAL ROM:   Active ROM A/PROM (deg) eval A/ROM 10/27  Flexion 30 45  Extension 30 40  Right lateral flexion 25 40  Left lateral flexion 25 42  Right rotation 40 55  Left rotation 50 60  Pain all motions above  UPPER EXTREMITY ROM: WFL with end range stiffness and pain at full elevation  UPPER EXTREMITY MMT: Cervical: strong and painful when activating Rt musculature Bil UE strength: weak middle and lower traps 4/5 bil    TREATMENT DATE:  08/10/24 NuStep L7 x 6' PT present to discuss status and progress 3 way scapular stabilization with yellow loop x 5 each side 4D ball rolls x 20 each direction on each UE with yellow  Standing shoulder flexion and scaption with 5 lbs 2 x 10 each Prone shoulder extension, rows and horizontal abduction x 20 each with 5 lbs Side lying ER x 20  each UE with 5 lbs Supine serratus punch x 20 with 5lbs both Trigger Point Dry Needling Subsequent Treatment: Instructions provided previously at initial dry needling treatment.  Patient Verbal Consent Given: Yes Education Handout Provided:  Previously Provided Muscles Treated: cervical multifidi and upper traps Electrical Stimulation Performed: No Treatment Response/Outcome: Skilled palpation used to identify taut bands and trigger points.  Once identified, dry needling techniques used to treat these areas.  Twitch response ellicited along with palpable elongation of muscle in upper traps, multifidi elicited desired deep ache response.  Following treatment, patient reported decreased tension in the neck and shoulders.    08/07/24 NuStep L7 x 6' PT present to discuss status 3 way scapular stabilization with blue loop x 5 each side 4D ball rolls x 20 each direction on each UE Neck retraction into red tband standing holding band against wall Red tband horiz abd, D2 flexion red tband 2x10 each Standing single arm ER with blue tband with handles 1 x 10 (shoulder fatigue/burning) Modified deadlift 10lb from 8 box 2x10 Farmer carry 10lb 1 lap around ortho gym holding on each side of trunk Prone wing taps 2 x 10 Prone reach and pull 2 x 10 Supine MELT - Pt reported the full size foam roller pushes his neck too far forward so we used half foam which was more comfortable Ice with 1/2 foam roller x10'  08/03/24: UBE x 6'  (3/3) PT present to discuss status 3 way scapular stabilization with blue loop x 5 each side 4D ball rolls x 20 each direction on each UE 25 lb teal serious steel band chest press (hug the tree) 2 x 10 then serratus punch 2 x 10 Standing bilateral shoulder ER with red tband 2 x 10 Standing shoulder horizontal abduction 2 x 10 with red tband Standing single arm ER with blue tband with handles 2 x 10 Prone wing taps 2 x 10 Prone reach and pull 2 x 10 MELT: c spine on blue foam roller : x 20 rotation, flex/ext x 20, figure 8's x 10 each side Ice cervical spine x10'  07/31/24: NuStep L4 x 6' PT present to discuss status Supine LTR 3x10 each way SL open book x10 each way with top leg on foam roller - some Rt LBP  into Rt Rot Supine hooklying neck retraction into towel with overlay of red tband x10 each: horiz abd, D2 flexion, bil ER Supine manual therapy: SO release bil, mob with movement to upper cervical spine with bil neck rotation, contract/relax at end range bil neck rotation Supine bridge 10x5 Supine 90/90 heel taps x20 Neck ROM measurements updated Seated SNAG for bil neck rotation x10 each way Ice cervical spine x10'                                                             PATIENT EDUCATION:  Education details: [email or web address] Person educated: Patient Education method: Explanation, Demonstration, Verbal cues, and Handouts Education comprehension: verbalized understanding and returned demonstration  HOME EXERCISE PROGRAM: Access Code: [email or web address] URL: [email or web address] Date: 07/31/2024 Prepared by: Orvil Beuhring  Exercises - Supine Lower Trunk Rotation  - 1 x daily - 7 x weekly - 1 sets - 10 reps - 5 hold - Supine Posterior Pelvic Tilt  - 1 x daily - 7 x  weekly - 1 sets - 10 reps - 5 hold - Supine Cervical Retraction with Towel  - 1 x daily - 7 x weekly - 1 sets - 10 reps - 5 hold - Hooklying Single Knee to Chest Stretch  - 1 x daily - 7 x weekly - 1 sets - 3 reps - 20 hold - Supine Piriformis Stretch with Foot on Ground  - 1 x daily - 7 x weekly - 1 sets - 3 reps - 20 hold - Standing Row with Anchored Resistance  - 1 x daily - 7 x weekly - 2 sets - 10 reps - Standing Hamstring Stretch on Chair  - 1 x daily - 7 x weekly - 1 sets - 3 reps - 30 sec hold - Quadricep Stretch with Chair and Counter Support  - 1 x daily - 7 x weekly - 1 sets - 3 reps - 30 sec hold - Seated Figure 4 Piriformis Stretch  - 1 x daily - 7 x weekly - 1 sets - 3 reps - 30 sed hold - Supine 90/90 Alternating Heel Touches with Posterior Pelvic Tilt  - 1 x daily - 7 x weekly - 3 sets - 10 reps - Supine Dead Bug with Leg Extension  - 1 x daily - 7 x weekly - 3 sets - 10 reps - Supine Swiss Ball Handoff   - 1 x daily - 7 x weekly - 3 sets - 10 reps - Sidelying Open Book Thoracic Rotation with Knee on Foam Roll  - 1 x daily - 7 x weekly - 1 sets - 10 reps ASSESSMENT:  CLINICAL IMPRESSION: Symeon arrives with c/o increased pain.  We continued to focus on restoring proper arthrokinematics to both shoulders.  He was able to do all exercises with 5 lbs with fatigue only at last few reps.  He requested DN as this seems to have helped him the last time we did this.  He had less twitch responses today but reported improvement after session.  He would benefit from continued skilled PT to progress toward goals below.    OBJECTIVE IMPAIRMENTS: decreased activity tolerance, decreased mobility, decreased ROM, decreased strength, hypomobility, increased muscle spasms, impaired flexibility, impaired sensation, improper body mechanics, and pain.   ACTIVITY LIMITATIONS: lifting, bending, sitting, standing, squatting, sleeping, and locomotion level  PARTICIPATION LIMITATIONS: cleaning, driving, shopping, community activity, and yard work  PERSONAL FACTORS: 1 comorbidity: Lt ankle arthrodesis are also affecting patient's functional outcome.   REHAB POTENTIAL: Excellent  CLINICAL DECISION MAKING: Evolving/moderate complexity  EVALUATION COMPLEXITY: Moderate   GOALS: Goals reviewed with patient? Yes  SHORT TERM GOALS: Target date: 07/31/24  Pt will be ind with initial HEP Baseline: Goal status: MET 10/27  2.  Pt will report reduced pain with daily activities by at least 25% Baseline:  Goal status: ONGOING 10/27  3.  Pt will be able to sit up to 30 min without increased lumbar pain Baseline: 08/03/24 can do 10-15 min Goal status: In progress  4.  Pt will improve neck rotation to at least 55 deg bil without sharp pain Baseline:  Goal status: MET 10/27  5.  Pt will be able to flex trunk to reach item on ground without increased back pain Baseline:  Goal status: INITIAL    LONG TERM GOALS:  Target date: 08/28/24  Pt will be ind with advanced HEP Baseline:  Goal status: INITIAL  2.  Pt will improve ODI to </= 15/50 (30%) to demo reduced disability  level Baseline:  Goal status: INITIAL  3.  Pt will be able to drive without any sharp neck pains when checking blind spots Baseline:  Goal status: INITIAL  4.  Pt will report improved pain in cervical spine to no greater than 3/10 most days of the week Baseline: 5-8/10 Goal status: INITIAL  5.  Pt will report improved pain in lumbar spine to no greater than 3/10 with daily tasks most days of the week Baseline:  Goal status: INITIAL   PLAN:  PT FREQUENCY: 2x/week  PT DURATION: 8 weeks  PLANNED INTERVENTIONS: 97110-Therapeutic exercises, 97530- Therapeutic activity, 97112- Neuromuscular re-education, 97535- Self Care, [id number]- Manual therapy, [phone]- Aquatic Therapy, [id number]- Electrical stimulation (unattended), [phone]- Electrical stimulation (manual), [id number]- Traction (mechanical), 20560 (1-2 muscles), 20561 (3+ muscles)- Dry Needling, Patient/Family education, Taping, Joint mobilization, Spinal mobilization, Cryotherapy, and Moist heat.  PLAN FOR NEXT SESSION: postural and core stabilization, left shoulder IR/ER strengthening, review and progress HEP for LE and neck stretching, trunk and neck stabilization, hip strength bil  Keidan Aumiller B. Siham Bucaro, PT 08/10/24 2:50 PM [facility] [address], [address] [place], [place] [address] Phone # [phone] Fax [phone]

## 2024-08-15 ENCOUNTER — Ambulatory Visit

## 2024-08-15 DIAGNOSIS — R262 Difficulty in walking, not elsewhere classified: Secondary | ICD-10-CM

## 2024-08-15 DIAGNOSIS — R293 Abnormal posture: Secondary | ICD-10-CM

## 2024-08-15 DIAGNOSIS — M6281 Muscle weakness (generalized): Secondary | ICD-10-CM

## 2024-08-15 DIAGNOSIS — M542 Cervicalgia: Secondary | ICD-10-CM | POA: Diagnosis not present

## 2024-08-15 DIAGNOSIS — R252 Cramp and spasm: Secondary | ICD-10-CM

## 2024-08-15 DIAGNOSIS — M5459 Other low back pain: Secondary | ICD-10-CM

## 2024-08-15 NOTE — Therapy (Signed)
 OUTPATIENT PHYSICAL THERAPY THORACOLUMBAR TREATMENT NOTE   Patient Name: Tom White MRN: [id number] DOB:1961-03-15, 63 y.o., male Today's Date: 08/15/2024  END OF SESSION:  PT End of Session - 08/15/24 1403     Visit Number 8    Date for Recertification  08/28/24    Authorization Type [employer] Medicare    Progress Note Due on Visit 10    PT Start Time 1403    PT Stop Time 1443    PT Time Calculation (min) 40 min    Activity Tolerance Patient tolerated treatment well    Behavior During Therapy [facility] for tasks assessed/performed            Past Medical History:  Diagnosis Date   Arthritis    had it in my left ankle (05/04/2017)   Environmental and seasonal allergies 06/07/2015   Fibrosis of subtalar joint, 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   Mild cognitive impairment of uncertain or unknown etiology 09/11/2021   Mixed hyperlipidemia 06/14/2016   Pain in left ankle and joints of left foot 06/17/2017   Pain in rectum    Pneumonia 2017   PTSD (post-traumatic stress disorder) 01/30/2020   Slow transit constipation 04/02/2021   Urinary incontinence    Past Surgical History:  Procedure Laterality Date   ANKLE ARTHROSCOPY Left ~ 2013   scraped out arthritis   ANKLE ARTHROSCOPY WITH FUSION Left 09/22/2017   Procedure: LEFT ANKLE POSTERIOR ARTHROSCOPIC SUBTALAR ARTHRODESIS;  Surgeon: Harden Jerona GAILS, MD;  Location: [facility] OR;  Service: Orthopedics;  Laterality: Left;   ANKLE FRACTURE SURGERY Left 12/1980   APPLICATION OF WOUND VAC Right 05/03/2017   foot   APPLICATION OF  WOUND VAC Right 05/03/2017   Procedure: APPLICATION OF WOUND VAC;  Surgeon: Barbarann Oneil BROCKS, MD;  Location: [facility] OR;  Service: Orthopedics;  Laterality: Right;   FRACTURE SURGERY     I & D EXTREMITY Right 05/03/2017   foot   I & D EXTREMITY Right 05/03/2017   Procedure: IRRIGATION AND DEBRIDEMENT EXTREMITY RIGHT, REPAIR OF POSTERIOR TIBIAL TENDON;  Surgeon: Barbarann Oneil BROCKS, MD;  Location: [facility] OR;  Service: Orthopedics;  Laterality: Right;   LAPAROSCOPIC CHOLECYSTECTOMY  1996?   POSTERIOR TIBIAL TENDON REPAIR Right 05/03/2017   SUBTALAR JOINT ARTHROEREISIS Left 1985?   TONSILLECTOMY     Patient Active Problem List   Diagnosis Date Noted   GERD (gastroesophageal reflux disease) 09/11/2021   Mild cognitive impairment of uncertain or unknown etiology 09/11/2021   Urinary incontinence    Pain in rectum    Major depressive disorder    History of concussion    Malignant melanoma of skin of back 04/16/2021   Slow transit constipation 04/02/2021   PTSD (post-traumatic stress disorder) 01/30/2020   Pain in left ankle and joints of left foot 06/17/2017   Fibrosis of subtalar joint, left 01/21/2017   Mixed hyperlipidemia 06/14/2016   Environmental and seasonal allergies 06/07/2015   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   Generalized anxiety disorder 06/07/2015    PCP: Ozell Reilly, MD  REFERRING PROVIDER: Shepperson, Kirstin, PA-C  REFERRING DIAG: [phone] (ICD-10-CM) - Spondylosis without myelopathy or radiculopathy, cervical region M47.816 (ICD-10-CM) - Spondylosis without myelopathy or radiculopathy, lumbar region  Rationale for Evaluation and Treatment: Rehabilitation  THERAPY DIAG:  Cervicalgia  Cramp and spasm  Other low back pain  Abnormal posture  Muscle weakness (generalized)  Difficulty in walking, not elsewhere classified  ONSET DATE: 3 mos ago - fell  SUBJECTIVE:                                                                                                                                                                                            SUBJECTIVE STATEMENT: My neck feels good today.  I think we are making some progress.  My back is a little more bothersome today   PERTINENT HISTORY:  Lt ankle arthrodesis, PTSD, anxiety, depression  PAIN:   08/15/24 Are you having pain? Yes NPRS scale: 4-5/10 neck, 6-7/10 lumbar Pain location: bil neck, central lumbar Pain orientation: Medial and Lower (lumbar), base of skull and down to shoulders bil (cervical) PAIN TYPE: aching, dull, (lumbar) and sharp (neck) Pain description: constant  Aggravating factors: Cervical - sleep (is a stomach sleeper), turning head. Lumbar - sitting Relieving factors: unsure   PRECAUTIONS: None  RED FLAGS: None   WEIGHT BEARING RESTRICTIONS: No  FALLS:  Has patient fallen in last 6 months? Yes. Number of falls 1  LIVING ENVIRONMENT: Lives with: lives with their spouse Lives in: House/apartment Stairs: Yes: Internal: 16 steps; on right going up and on left going up and External: 5 steps; can reach both Has following equipment at home: None  OCCUPATION: retired, swimming at [employer]   PLOF: Independent  PATIENT GOALS: less pain, join his wife at the gym-3 days a week  NEXT MD VISIT: unsure  OBJECTIVE:  Note: Objective measures were completed at Evaluation unless otherwise noted.  DIAGNOSTIC FINDINGS:  Not accessible in chart but Pt reports neck and back have signif arthritis  PATIENT SURVEYS:  Modified Oswestry:  MODIFIED OSWESTRY DISABILITY SCALE  Date: 07/03/24 Score  Pain intensity 3 =  Pain medication provides me with moderate relief from pain.  2. Personal care (washing, dressing, etc.) 1 =  I can take care of myself normally, but it increases my pain.  3. Lifting 4 = I can lift only very light weights  4. Walking 1 = Pain prevents me from walking more than 1 mile.  5. Sitting 3 =  Pain prevents me from sitting more than  hour.  6.  Standing 3 =  Pain prevents me from standing more than 1/2 hour.  7. Sleeping  3 =  Even when I take pain medication, I sleep less than 4 hours.  8. Social Life 3 =  Pain prevents me from going out very often.  9. Traveling 4 = My pain restricts my travel to short necessary journeys under 1/2 hour.  10. Employment/ Homemaking 2 = I can perform most of my homemaking/job duties, but pain prevents me from performing more physically stressful activities (eg, lifting, vacuuming).  Total 27/50   Interpretation of scores: Score Category Description  0-20% Minimal Disability The patient can cope with most living activities. Usually no treatment is indicated apart from advice on lifting, sitting and exercise  21-40% Moderate Disability The patient experiences more pain and difficulty with sitting, lifting and standing. Travel and social life are more difficult and they may be disabled from work. Personal care, sexual activity and sleeping are not grossly affected, and the patient can usually be managed by conservative means  41-60% Severe Disability Pain remains the main problem in this group, but activities of daily living are affected. These patients require a detailed investigation  61-80% Crippled Back pain impinges on all aspects of the patient's life. Positive intervention is required  81-100% Bed-bound  These patients are either bed-bound or exaggerating their symptoms  Bluford FORBES Zoe DELENA Karon DELENA, et al. Surgery versus conservative management of stable thoracolumbar fracture: the PRESTO feasibility RCT. [place]): [employer]; 2021 Nov. [facility] Technology Assessment, No. 25.62.) Appendix 3, Oswestry Disability Index category descriptors. Available from: [email or web address].cz  Minimally Clinically Important Difference (MCID) = 12.8%  COGNITION: Overall cognitive status: Pt reports he has had STM challenges lately     SENSATION: Pt reports tingling in bil  neck and shoulders  MUSCLE LENGTH: Hamstrings: Right 65 deg; Left 65 deg Quads: limited 25% bil Piriformis: limited 25% bil  POSTURE: No Significant postural limitations  PALPATION: Tender Rt facets L4/5, L5/S1 Tender bil facets C5-C7 Trigger points present: deep cervical multifidi lower cervical spine and upper traps bil  LUMBAR ROM:   AROM eval  Flexion Hands to ankles with LBP  Extension Full with pain  Right lateral flexion Full with Rt pain  Left lateral flexion Full with Rt pain  Right rotation 75% with pain  Left rotation 75% with pain   (Blank rows = not tested)  LOWER EXTREMITY ROM:    Limited end range knee flexion in prone secondary to tight quads Limited end range hip flexion secondary to tight gluteals Good hip IR/ER  LOWER EXTREMITY MMT:   Hips 4/5 bil all planes Poor TA stabilization during LE MMT   GAIT: No major gait deviations  CERVICAL ROM:   Active ROM A/PROM (deg) eval A/ROM 10/27  Flexion 30 45  Extension 30 40  Right lateral flexion 25 40  Left lateral flexion 25 42  Right rotation 40 55  Left rotation 50 60  Pain all motions above  UPPER EXTREMITY ROM: WFL with end range stiffness and pain at full elevation  UPPER EXTREMITY MMT: Cervical: strong and painful when activating Rt musculature Bil UE strength: weak middle and lower traps 4/5 bil    TREATMENT DATE:  08/15/24 UBE x 6 min 3/3 Standing hamstring stretch 3 x 30 Standing quad/hip flexor stretch 3 x 30 Seated piriformis stretch 3 x 30 Supine PPT x 20 PPT with 90/90 heel tap x 20 PPT with dying bug x 20 Trigger Point Dry Needling Subsequent Treatment: Instructions provided previously at initial dry needling treatment.  Patient Verbal Consent Given: Yes  Education Handout Provided: Previously Provided Muscles Treated: lumbar multifidi Electrical Stimulation Performed: No Treatment Response/Outcome: Skilled palpation used to identify taut bands and trigger points.   Once identified, dry needling techniques used to treat these areas.  Twitch response ellicited along with palpable elongation of muscle.  Following treatment, patient reported    08/10/24 NuStep L7 x 6' PT present to discuss status and progress 3 way scapular stabilization with yellow loop x 5 each side 4D ball rolls x 20 each direction on each UE with yellow  Standing shoulder flexion and scaption with 5 lbs 2 x 10 each Prone shoulder extension, rows and horizontal abduction x 20 each with 5 lbs Side lying ER x 20 each UE with 5 lbs Supine serratus punch x 20 with 5lbs both Trigger Point Dry Needling Subsequent Treatment: Instructions provided previously at initial dry needling treatment.  Patient Verbal Consent Given: Yes Education Handout Provided: Previously Provided Muscles Treated: cervical multifidi and upper traps Electrical Stimulation Performed: No Treatment Response/Outcome: Skilled palpation used to identify taut bands and trigger points.  Once identified, dry needling techniques used to treat these areas.  Twitch response ellicited along with palpable elongation of muscle in upper traps, multifidi elicited desired deep ache response.  Following treatment, patient reported decreased tension in the neck and shoulders.    08/07/24 NuStep L7 x 6' PT present to discuss status 3 way scapular stabilization with blue loop x 5 each side 4D ball rolls x 20 each direction on each UE Neck retraction into red tband standing holding band against wall Red tband horiz abd, D2 flexion red tband 2x10 each Standing single arm ER with blue tband with handles 1 x 10 (shoulder fatigue/burning) Modified deadlift 10lb from 8 box 2x10 Farmer carry 10lb 1 lap around ortho gym holding on each side of trunk Prone wing taps 2 x 10 Prone reach and pull 2 x 10 Supine MELT - Pt reported the full size foam roller pushes his neck too far forward so we used half foam which was more comfortable Ice with 1/2  foam roller x10'  08/03/24: UBE x 6'  (3/3) PT present to discuss status 3 way scapular stabilization with blue loop x 5 each side 4D ball rolls x 20 each direction on each UE 25 lb teal serious steel band chest press (hug the tree) 2 x 10 then serratus punch 2 x 10 Standing bilateral shoulder ER with red tband 2 x 10 Standing shoulder horizontal abduction 2 x 10 with red tband Standing single arm ER with blue tband with handles 2 x 10 Prone wing taps 2 x 10 Prone reach and pull 2 x 10 MELT: c spine on blue foam roller : x 20 rotation, flex/ext x 20, figure 8's x 10 each side Ice cervical spine x10'                                       PATIENT EDUCATION:  Education details: [email or web address] Person educated: Patient Education method: Explanation, Demonstration, Verbal cues, and Handouts Education comprehension: verbalized understanding and returned demonstration  HOME EXERCISE PROGRAM: Access Code: [email or web address] URL: [email or web address] Date: 07/31/2024 Prepared by: Orvil Beuhring  Exercises - Supine Lower Trunk Rotation  - 1 x daily - 7 x weekly - 1 sets - 10 reps - 5 hold - Supine Posterior Pelvic Tilt  - 1 x daily - 7 x weekly - 1 sets -  10 reps - 5 hold - Supine Cervical Retraction with Towel  - 1 x daily - 7 x weekly - 1 sets - 10 reps - 5 hold - Hooklying Single Knee to Chest Stretch  - 1 x daily - 7 x weekly - 1 sets - 3 reps - 20 hold - Supine Piriformis Stretch with Foot on Ground  - 1 x daily - 7 x weekly - 1 sets - 3 reps - 20 hold - Standing Row with Anchored Resistance  - 1 x daily - 7 x weekly - 2 sets - 10 reps - Standing Hamstring Stretch on Chair  - 1 x daily - 7 x weekly - 1 sets - 3 reps - 30 sec hold - Quadricep Stretch with Chair and Counter Support  - 1 x daily - 7 x weekly - 1 sets - 3 reps - 30 sec hold - Seated Figure 4 Piriformis Stretch  - 1 x daily - 7 x weekly - 1 sets - 3 reps - 30 sed hold - Supine 90/90 Alternating Heel Touches with  Posterior Pelvic Tilt  - 1 x daily - 7 x weekly - 3 sets - 10 reps - Supine Dead Bug with Leg Extension  - 1 x daily - 7 x weekly - 3 sets - 10 reps - Supine Swiss Ball Handoff  - 1 x daily - 7 x weekly - 3 sets - 10 reps - Sidelying Open Book Thoracic Rotation with Knee on Foam Roll  - 1 x daily - 7 x weekly - 1 sets - 10 reps ASSESSMENT:  CLINICAL IMPRESSION: Meiko arrived with decreased neck and shoulder pain but reported slight increased in back pain.  We addressed this with stretching and core exercises.  He was able to complete these with some verbal cues for correct technique.  He fatigues easily on higher level core strength but is able to complete all reps with rest breaks.  We did DN to lumbar spine today.  He reported decreased pain and tightness in the lumbar area post treatment.   He would benefit from continued skilled PT to progress toward goals below.    OBJECTIVE IMPAIRMENTS: decreased activity tolerance, decreased mobility, decreased ROM, decreased strength, hypomobility, increased muscle spasms, impaired flexibility, impaired sensation, improper body mechanics, and pain.   ACTIVITY LIMITATIONS: lifting, bending, sitting, standing, squatting, sleeping, and locomotion level  PARTICIPATION LIMITATIONS: cleaning, driving, shopping, community activity, and yard work  PERSONAL FACTORS: 1 comorbidity: Lt ankle arthrodesis are also affecting patient's functional outcome.   REHAB POTENTIAL: Excellent  CLINICAL DECISION MAKING: Evolving/moderate complexity  EVALUATION COMPLEXITY: Moderate   GOALS: Goals reviewed with patient? Yes  SHORT TERM GOALS: Target date: 07/31/24  Pt will be ind with initial HEP Baseline: Goal status: MET 10/27  2.  Pt will report reduced pain with daily activities by at least 25% Baseline:  Goal status: ONGOING 10/27  3.  Pt will be able to sit up to 30 min without increased lumbar pain Baseline: 08/03/24 can do 10-15 min Goal status: In  progress  4.  Pt will improve neck rotation to at least 55 deg bil without sharp pain Baseline:  Goal status: MET 10/27  5.  Pt will be able to flex trunk to reach item on ground without increased back pain Baseline:  Goal status: INITIAL    LONG TERM GOALS: Target date: 08/28/24  Pt will be ind with advanced HEP Baseline:  Goal status: INITIAL  2.  Pt will improve  ODI to </= 15/50 (30%) to demo reduced disability level Baseline:  Goal status: INITIAL  3.  Pt will be able to drive without any sharp neck pains when checking blind spots Baseline:  Goal status: INITIAL  4.  Pt will report improved pain in cervical spine to no greater than 3/10 most days of the week Baseline: 5-8/10 Goal status: INITIAL  5.  Pt will report improved pain in lumbar spine to no greater than 3/10 with daily tasks most days of the week Baseline:  Goal status: INITIAL   PLAN:  PT FREQUENCY: 2x/week  PT DURATION: 8 weeks  PLANNED INTERVENTIONS: 97110-Therapeutic exercises, 97530- Therapeutic activity, 97112- Neuromuscular re-education, 97535- Self Care, [id number]- Manual therapy, [phone]- Aquatic Therapy, [id number]- Electrical stimulation (unattended), [phone]- Electrical stimulation (manual), [id number]- Traction (mechanical), 20560 (1-2 muscles), 20561 (3+ muscles)- Dry Needling, Patient/Family education, Taping, Joint mobilization, Spinal mobilization, Cryotherapy, and Moist heat.  PLAN FOR NEXT SESSION: continue postural and core stabilization, left shoulder IR/ER strengthening, review and progress HEP for LE and neck stretching, trunk and neck stabilization, hip strength bil  Akaya Proffit B. Yoshika Vensel, PT 08/15/24 5:04 PM [facility] [address] [place], [place] [address] Phone # [phone] Fax [phone]

## 2024-08-17 ENCOUNTER — Ambulatory Visit

## 2024-08-17 DIAGNOSIS — R252 Cramp and spasm: Secondary | ICD-10-CM

## 2024-08-17 DIAGNOSIS — M542 Cervicalgia: Secondary | ICD-10-CM | POA: Diagnosis not present

## 2024-08-17 DIAGNOSIS — M5459 Other low back pain: Secondary | ICD-10-CM

## 2024-08-17 DIAGNOSIS — R293 Abnormal posture: Secondary | ICD-10-CM

## 2024-08-17 DIAGNOSIS — R262 Difficulty in walking, not elsewhere classified: Secondary | ICD-10-CM

## 2024-08-17 DIAGNOSIS — M6281 Muscle weakness (generalized): Secondary | ICD-10-CM

## 2024-08-17 DIAGNOSIS — M25612 Stiffness of left shoulder, not elsewhere classified: Secondary | ICD-10-CM

## 2024-08-17 NOTE — Therapy (Signed)
 OUTPATIENT PHYSICAL THERAPY THORACOLUMBAR TREATMENT NOTE   Patient Name: Tom White MRN: [id number] DOB:22-Oct-1960, 63 y.o., male Today's Date: 08/17/2024  END OF SESSION:  PT End of Session - 08/17/24 1411     Visit Number 9    Date for Recertification  08/28/24    Authorization Type [employer] Medicare    Progress Note Due on Visit 10    PT Start Time 1400    PT Stop Time 1452    PT Time Calculation (min) 52 min    Activity Tolerance Patient tolerated treatment well    Behavior During Therapy WFL for tasks assessed/performed            Past Medical History:  Diagnosis Date   Arthritis    had it in my left ankle (05/04/2017)   Environmental and seasonal allergies 06/07/2015   Fibrosis of subtalar joint, 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   Mild cognitive impairment of uncertain or unknown etiology 09/11/2021   Mixed hyperlipidemia 06/14/2016   Pain in left ankle and joints of left foot 06/17/2017   Pain in rectum    Pneumonia 2017   PTSD (post-traumatic stress disorder) 01/30/2020   Slow transit constipation 04/02/2021   Urinary incontinence    Past Surgical History:  Procedure Laterality Date   ANKLE ARTHROSCOPY Left ~ 2013   scraped out arthritis   ANKLE ARTHROSCOPY WITH FUSION Left 09/22/2017   Procedure: LEFT ANKLE POSTERIOR ARTHROSCOPIC SUBTALAR ARTHRODESIS;  Surgeon: Harden Jerona GAILS, MD;  Location[facility] OR;  Service: Orthopedics;  Laterality: Left;   ANKLE FRACTURE SURGERY Left 12/1980   APPLICATION OF WOUND VAC Right 05/03/2017   foot   APPLICATION OF  WOUND VAC Right 05/03/2017   Procedure: APPLICATION OF WOUND VAC;  Surgeon: Barbarann Oneil BROCKS, MD;  Location: [facility] OR;  Service: Orthopedics;  Laterality: Right;   FRACTURE SURGERY     I & D EXTREMITY Right 05/03/2017   foot   I & D EXTREMITY Right 05/03/2017   Procedure: IRRIGATION AND DEBRIDEMENT EXTREMITY RIGHT, REPAIR OF POSTERIOR TIBIAL TENDON;  Surgeon: Barbarann Oneil BROCKS, MD;  Location: [facility] OR;  Service: Orthopedics;  Laterality: Right;   LAPAROSCOPIC CHOLECYSTECTOMY  1996?   POSTERIOR TIBIAL TENDON REPAIR Right 05/03/2017   SUBTALAR JOINT ARTHROEREISIS Left 1985?   TONSILLECTOMY     Patient Active Problem List   Diagnosis Date Noted   GERD (gastroesophageal reflux disease) 09/11/2021   Mild cognitive impairment of uncertain or unknown etiology 09/11/2021   Urinary incontinence    Pain in rectum    Major depressive disorder    History of concussion    Malignant melanoma of skin of back 04/16/2021   Slow transit constipation 04/02/2021   PTSD (post-traumatic stress disorder) 01/30/2020   Pain in left ankle and joints of left foot 06/17/2017   Fibrosis of subtalar joint, left 01/21/2017   Mixed hyperlipidemia 06/14/2016   Environmental and seasonal allergies 06/07/2015   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   Generalized anxiety disorder 06/07/2015    PCP: Ozell Reilly, MD  REFERRING PROVIDER: Donata Snowman, PA-C  REFERRING DIAG: [phone] (ICD-10-CM) - Spondylosis without myelopathy or radiculopathy, cervical region M47.816 (ICD-10-CM) - Spondylosis without myelopathy or radiculopathy, lumbar region  Rationale for Evaluation and Treatment: Rehabilitation  THERAPY DIAG:  Cervicalgia  Other low back pain  Cramp and spasm  Difficulty in walking, not elsewhere classified  Abnormal posture  Muscle weakness (generalized)  Stiffness of left shoulder, not elsewhere classified  ONSET DATE: 3 mos ago - fell  SUBJECTIVE:                                                                                                                                                                                            SUBJECTIVE STATEMENT: I'm actually feeling pretty good today   PERTINENT HISTORY:  Lt ankle arthrodesis, PTSD, anxiety, depression  PAIN:   08/17/24 Are you having pain? Yes NPRS scale: 3-4/10 neck, 5-6/10 lumbar Pain location: bil neck, central lumbar Pain orientation: Medial and Lower (lumbar), base of skull and down to shoulders bil (cervical) PAIN TYPE: aching, dull, (lumbar) and sharp (neck) Pain description: constant  Aggravating factors: Cervical - sleep (is a stomach sleeper), turning head. Lumbar - sitting Relieving factors: unsure   PRECAUTIONS: None  RED FLAGS: None   WEIGHT BEARING RESTRICTIONS: No  FALLS:  Has patient fallen in last 6 months? Yes. Number of falls 1  LIVING ENVIRONMENT: Lives with: lives with their spouse Lives in: House/apartment Stairs: Yes: Internal: 16 steps; on right going up and on left going up and External: 5 steps; can reach both Has following equipment at home: None  OCCUPATION: retired, swimming at [employer]   PLOF: Independent  PATIENT GOALS: less pain, join his wife at the gym-3 days a week  NEXT MD VISIT: unsure  OBJECTIVE:  Note: Objective measures were completed at Evaluation unless otherwise noted.  DIAGNOSTIC FINDINGS:  Not accessible in chart but Pt reports neck and back have signif arthritis  PATIENT SURVEYS:  Modified Oswestry:  MODIFIED OSWESTRY DISABILITY SCALE  Date: 07/03/24 Score  Pain intensity 3 =  Pain medication provides me with moderate relief from pain.  2. Personal care (washing, dressing, etc.) 1 =  I can take care of myself normally, but it increases my pain.  3. Lifting 4 = I can lift only very light weights  4. Walking 1 = Pain prevents me from walking more than 1 mile.  5. Sitting 3 =  Pain prevents me from sitting more than  hour.  6. Standing 3 =  Pain  prevents me from standing more than 1/2 hour.  7. Sleeping 3 =  Even when I take pain  medication, I sleep less than 4 hours.  8. Social Life 3 =  Pain prevents me from going out very often.  9. Traveling 4 = My pain restricts my travel to short necessary journeys under 1/2 hour.  10. Employment/ Homemaking 2 = I can perform most of my homemaking/job duties, but pain prevents me from performing more physically stressful activities (eg, lifting, vacuuming).  Total 27/50   Interpretation of scores: Score Category Description  0-20% Minimal Disability The patient can cope with most living activities. Usually no treatment is indicated apart from advice on lifting, sitting and exercise  21-40% Moderate Disability The patient experiences more pain and difficulty with sitting, lifting and standing. Travel and social life are more difficult and they may be disabled from work. Personal care, sexual activity and sleeping are not grossly affected, and the patient can usually be managed by conservative means  41-60% Severe Disability Pain remains the main problem in this group, but activities of daily living are affected. These patients require a detailed investigation  61-80% Crippled Back pain impinges on all aspects of the patient's life. Positive intervention is required  81-100% Bed-bound  These patients are either bed-bound or exaggerating their symptoms  Bluford FORBES Zoe DELENA Karon DELENA, et al. Surgery versus conservative management of stable thoracolumbar fracture: the PRESTO feasibility RCT. [place] ([place]): [employer]; 2021 Nov. [facility] Technology Assessment, No. 25.62.) Appendix 3, Oswestry Disability Index category descriptors. Available from: Findjewelers.cz  Minimally Clinically Important Difference (MCID) = 12.8%  COGNITION: Overall cognitive status: Pt reports he has had STM challenges lately     SENSATION: Pt reports tingling in bil neck and  shoulders  MUSCLE LENGTH: Hamstrings: Right 65 deg; Left 65 deg Quads: limited 25% bil Piriformis: limited 25% bil  POSTURE: No Significant postural limitations  PALPATION: Tender Rt facets L4/5, L5/S1 Tender bil facets C5-C7 Trigger points present: deep cervical multifidi lower cervical spine and upper traps bil  LUMBAR ROM:   AROM eval  Flexion Hands to ankles with LBP  Extension Full with pain  Right lateral flexion Full with Rt pain  Left lateral flexion Full with Rt pain  Right rotation 75% with pain  Left rotation 75% with pain   (Blank rows = not tested)  LOWER EXTREMITY ROM:    Limited end range knee flexion in prone secondary to tight quads Limited end range hip flexion secondary to tight gluteals Good hip IR/ER  LOWER EXTREMITY MMT:   Hips 4/5 bil all planes Poor TA stabilization during LE MMT   GAIT: No major gait deviations  CERVICAL ROM:   Active ROM A/PROM (deg) eval A/ROM 10/27  Flexion 30 45  Extension 30 40  Right lateral flexion 25 40  Left lateral flexion 25 42  Right rotation 40 55  Left rotation 50 60  Pain all motions above  UPPER EXTREMITY ROM: WFL with end range stiffness and pain at full elevation  UPPER EXTREMITY MMT: Cervical: strong and painful when activating Rt musculature Bil UE strength: weak middle and lower traps 4/5 bil    TREATMENT DATE:  08/17/24 Nustep x 6 min level 5 PT present to discuss status Standing hamstring stretch 3 x 30 Standing quad/hip flexor stretch 3 x 30 Seated piriformis stretch 3 x 30 Supine PPT x 20 PPT with 90/90 heel tap x 20 PPT with dying bug x 20 PPT with swiss ball pass 2 x 10 Prone plank x 3 (10 sec, 15 sec, 20 sec) Ice to lumbar spine  x 10 min at end of session in hook lying  08/15/24 UBE x 6 min 3/3 Standing hamstring stretch 3 x 30 Standing quad/hip flexor stretch 3 x 30 Seated piriformis stretch 3 x 30 Supine PPT x 20 PPT with 90/90 heel tap x 20 PPT with dying bug  x 20 Trigger Point Dry Needling Subsequent Treatment: Instructions provided previously at initial dry needling treatment.  Patient Verbal Consent Given: Yes Education Handout Provided: Previously Provided Muscles Treated: lumbar multifidi Electrical Stimulation Performed: No Treatment Response/Outcome: Skilled palpation used to identify taut bands and trigger points.  Once identified, dry needling techniques used to treat these areas.  Twitch response ellicited along with palpable elongation of muscle.  Following treatment, patient reported    08/10/24 NuStep L7 x 6' PT present to discuss status and progress 3 way scapular stabilization with yellow loop x 5 each side 4D ball rolls x 20 each direction on each UE with yellow  Standing shoulder flexion and scaption with 5 lbs 2 x 10 each Prone shoulder extension, rows and horizontal abduction x 20 each with 5 lbs Side lying ER x 20 each UE with 5 lbs Supine serratus punch x 20 with 5lbs both Trigger Point Dry Needling Subsequent Treatment: Instructions provided previously at initial dry needling treatment.  Patient Verbal Consent Given: Yes Education Handout Provided: Previously Provided Muscles Treated: cervical multifidi and upper traps Electrical Stimulation Performed: No Treatment Response/Outcome: Skilled palpation used to identify taut bands and trigger points.  Once identified, dry needling techniques used to treat these areas.  Twitch response ellicited along with palpable elongation of muscle in upper traps, multifidi elicited desired deep ache response.  Following treatment, patient reported decreased tension in the neck and shoulders.    PATIENT EDUCATION:  Education details: [email or web address] Person educated: Patient Education method: Explanation, Demonstration, Verbal cues, and Handouts Education comprehension: verbalized understanding and returned demonstration  HOME EXERCISE PROGRAM: Access Code: [email or web address] URL:  [email or web address] Date: 07/31/2024 Prepared by: Orvil Beuhring  Exercises - Supine Lower Trunk Rotation  - 1 x daily - 7 x weekly - 1 sets - 10 reps - 5 hold - Supine Posterior Pelvic Tilt  - 1 x daily - 7 x weekly - 1 sets - 10 reps - 5 hold - Supine Cervical Retraction with Towel  - 1 x daily - 7 x weekly - 1 sets - 10 reps - 5 hold - Hooklying Single Knee to Chest Stretch  - 1 x daily - 7 x weekly - 1 sets - 3 reps - 20 hold - Supine Piriformis Stretch with Foot on Ground  - 1 x daily - 7 x weekly - 1 sets - 3 reps - 20 hold - Standing Row with Anchored Resistance  - 1 x daily - 7 x weekly - 2 sets - 10 reps - Standing Hamstring Stretch on Chair  - 1 x daily - 7 x weekly - 1 sets - 3 reps - 30 sec hold - Quadricep Stretch with Chair and Counter Support  - 1 x daily - 7 x weekly - 1 sets - 3 reps - 30 sec hold - Seated Figure 4 Piriformis Stretch  - 1 x daily - 7 x weekly - 1 sets - 3 reps - 30 sed hold - Supine 90/90 Alternating Heel Touches with Posterior Pelvic Tilt  - 1 x daily - 7 x weekly - 3 sets - 10 reps - Supine Dead Bug with Leg Extension  - 1 x daily - 7  x weekly - 3 sets - 10 reps - Supine Swiss Ball Handoff  - 1 x daily - 7 x weekly - 3 sets - 10 reps - Sidelying Open Book Thoracic Rotation with Knee on Foam Roll  - 1 x daily - 7 x weekly - 1 sets - 10 reps ASSESSMENT:  CLINICAL IMPRESSION: Tom White arrived with less pain overall.  He wanted to focus on the lumbar spine as he felt that he was still weak in his core.  We focused on flexibility and core work today.  He did well, only fatiguing with the higher level core exercises such as ball pass and plank.  He would benefit from continued skilled PT to progress toward goals below.    OBJECTIVE IMPAIRMENTS: decreased activity tolerance, decreased mobility, decreased ROM, decreased strength, hypomobility, increased muscle spasms, impaired flexibility, impaired sensation, improper body mechanics, and pain.    ACTIVITY LIMITATIONS: lifting, bending, sitting, standing, squatting, sleeping, and locomotion level  PARTICIPATION LIMITATIONS: cleaning, driving, shopping, community activity, and yard work  PERSONAL FACTORS: 1 comorbidity: Lt ankle arthrodesis are also affecting patient's functional outcome.   REHAB POTENTIAL: Excellent  CLINICAL DECISION MAKING: Evolving/moderate complexity  EVALUATION COMPLEXITY: Moderate   GOALS: Goals reviewed with patient? Yes  SHORT TERM GOALS: Target date: 07/31/24  Pt will be ind with initial HEP Baseline: Goal status: MET 10/27  2.  Pt will report reduced pain with daily activities by at least 25% Baseline:  Goal status: ONGOING 10/27  3.  Pt will be able to sit up to 30 min without increased lumbar pain Baseline: 08/03/24 can do 10-15 min Goal status: In progress  4.  Pt will improve neck rotation to at least 55 deg bil without sharp pain Baseline:  Goal status: MET 10/27  5.  Pt will be able to flex trunk to reach item on ground without increased back pain Baseline:  Goal status: MET 08/17/24    LONG TERM GOALS: Target date: 08/28/24  Pt will be ind with advanced HEP Baseline:  Goal status: INITIAL  2.  Pt will improve ODI to </= 15/50 (30%) to demo reduced disability level Baseline:  Goal status: INITIAL  3.  Pt will be able to drive without any sharp neck pains when checking blind spots Baseline:  Goal status: INITIAL  4.  Pt will report improved pain in cervical spine to no greater than 3/10 most days of the week Baseline: 5-8/10 Goal status: INITIAL  5.  Pt will report improved pain in lumbar spine to no greater than 3/10 with daily tasks most days of the week Baseline:  Goal status: INITIAL   PLAN:  PT FREQUENCY: 2x/week  PT DURATION: 8 weeks  PLANNED INTERVENTIONS: 97110-Therapeutic exercises, 97530- Therapeutic activity, 97112- Neuromuscular re-education, 97535- Self Care, [id number]- Manual therapy, [phone]-  Aquatic Therapy, [id number]- Electrical stimulation (unattended), [phone]- Electrical stimulation (manual), [id number]- Traction (mechanical), 20560 (1-2 muscles), 20561 (3+ muscles)- Dry Needling, Patient/Family education, Taping, Joint mobilization, Spinal mobilization, Cryotherapy, and Moist heat.  PLAN FOR NEXT SESSION: continue postural and core stabilization, left shoulder IR/ER strengthening, review and progress HEP for LE and neck stretching, trunk and neck stabilization, hip strength bil  Queenie Aufiero B. Caylin Nass, PT 08/17/24 3:52 PM [facility] [address] [place], [place] [address] Phone # [phone] Fax [phone]

## 2024-08-22 ENCOUNTER — Ambulatory Visit

## 2024-08-22 DIAGNOSIS — M542 Cervicalgia: Secondary | ICD-10-CM | POA: Diagnosis not present

## 2024-08-22 DIAGNOSIS — R262 Difficulty in walking, not elsewhere classified: Secondary | ICD-10-CM

## 2024-08-22 DIAGNOSIS — M5459 Other low back pain: Secondary | ICD-10-CM

## 2024-08-22 DIAGNOSIS — M6281 Muscle weakness (generalized): Secondary | ICD-10-CM

## 2024-08-22 DIAGNOSIS — R293 Abnormal posture: Secondary | ICD-10-CM

## 2024-08-22 DIAGNOSIS — R252 Cramp and spasm: Secondary | ICD-10-CM

## 2024-08-22 NOTE — Therapy (Signed)
 OUTPATIENT PHYSICAL THERAPY THORACOLUMBAR TREATMENT NOTE Progress Note Reporting Period 07/03/24 to 08/22/24  See note below for Objective Data and Assessment of Progress/Goals.       Patient Name: Tom White MRN: [id number] DOB:January 02, 1961, 63 y.o., male Today's Date: 08/22/2024  END OF SESSION:  PT End of Session - 08/22/24 1401     Visit Number 10    Date for Recertification  08/28/24    Authorization Type [employer] Medicare    PT Start Time 1401    PT Stop Time 1445    PT Time Calculation (min) 44 min    Activity Tolerance Patient tolerated treatment well    Behavior During Therapy [facility] for tasks assessed/performed            Past Medical History:  Diagnosis Date   Arthritis    had it in my left ankle (05/04/2017)   Environmental and seasonal allergies 06/07/2015   Fibrosis of subtalar joint, 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   Mild cognitive impairment of uncertain or unknown etiology 09/11/2021   Mixed hyperlipidemia 06/14/2016   Pain in left ankle and joints of left foot 06/17/2017   Pain in rectum    Pneumonia 2017   PTSD (post-traumatic stress disorder) 01/30/2020   Slow transit constipation 04/02/2021   Urinary incontinence    Past Surgical History:  Procedure Laterality Date   ANKLE ARTHROSCOPY Left ~ 2013   scraped out arthritis   ANKLE ARTHROSCOPY WITH FUSION Left 09/22/2017   Procedure: LEFT ANKLE POSTERIOR ARTHROSCOPIC SUBTALAR ARTHRODESIS;  Surgeon: Harden Jerona GAILS, MD;  Location: [facility] OR;  Service: Orthopedics;  Laterality: Left;   ANKLE FRACTURE  SURGERY Left 12/1980   APPLICATION OF WOUND VAC Right 05/03/2017   foot   APPLICATION OF WOUND VAC Right 05/03/2017   Procedure: APPLICATION OF WOUND VAC;  Surgeon: Barbarann Oneil BROCKS, MD;  Location: [facility] OR;  Service: Orthopedics;  Laterality: Right;   FRACTURE SURGERY     I & D EXTREMITY Right 05/03/2017   foot   I & D EXTREMITY Right 05/03/2017   Procedure: IRRIGATION AND DEBRIDEMENT EXTREMITY RIGHT, REPAIR OF POSTERIOR TIBIAL TENDON;  Surgeon: Barbarann Oneil BROCKS, MD;  Location: [facility] OR;  Service: Orthopedics;  Laterality: Right;   LAPAROSCOPIC CHOLECYSTECTOMY  1996?   POSTERIOR TIBIAL TENDON REPAIR Right 05/03/2017   SUBTALAR JOINT ARTHROEREISIS Left 1985?   TONSILLECTOMY     Patient Active Problem List   Diagnosis Date Noted   GERD (gastroesophageal reflux disease) 09/11/2021   Mild cognitive impairment of uncertain or unknown etiology 09/11/2021   Urinary incontinence    Pain in rectum    Major depressive disorder    History of concussion    Malignant melanoma of skin of back 04/16/2021   Slow transit constipation 04/02/2021   PTSD (post-traumatic stress disorder) 01/30/2020   Pain in left ankle and joints of left foot 06/17/2017   Fibrosis of subtalar joint, left 01/21/2017   Mixed hyperlipidemia 06/14/2016   Environmental and seasonal allergies 06/07/2015   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  Generalized anxiety disorder 06/07/2015    PCP: Ozell Reilly, MD  REFERRING PROVIDER: Donata Snowman, PA-C  REFERRING DIAG: [phone] (ICD-10-CM) - Spondylosis without myelopathy or radiculopathy, cervical region M47.816 (ICD-10-CM) - Spondylosis without myelopathy or radiculopathy, lumbar region  Rationale for Evaluation and Treatment: Rehabilitation  THERAPY DIAG:  Cervicalgia  Other low back pain  Muscle weakness (generalized)  Cramp and spasm  Abnormal posture  Difficulty in walking, not elsewhere classified  ONSET DATE: 3 mos ago - fell  SUBJECTIVE:                                                                                                                                                                                            SUBJECTIVE STATEMENT: Doing pretty good  Neck pain reported at 5/10 and back pain at 4/10.    PERTINENT HISTORY:  Lt ankle arthrodesis, PTSD, anxiety, depression  PAIN:   08/22/24 Are you having pain? Yes NPRS scale: 5/10 neck, 4/10 lumbar Pain location: bil neck, central lumbar Pain orientation: Medial and Lower (lumbar), base of skull and down to shoulders bil (cervical) PAIN TYPE: aching, dull, (lumbar) and sharp (neck) Pain description: constant  Aggravating factors: Cervical - sleep (is a stomach sleeper), turning head. Lumbar - sitting Relieving factors: unsure   PRECAUTIONS: None  RED FLAGS: None   WEIGHT BEARING RESTRICTIONS: No  FALLS:  Has patient fallen in last 6 months? Yes. Number of falls 1  LIVING ENVIRONMENT: Lives with: lives with their spouse Lives in: House/apartment Stairs: Yes: Internal: 16 steps; on right going up and on left going up and External: 5 steps; can reach both Has following equipment at home: None  OCCUPATION: retired, swimming at [employer]   PLOF: Independent  PATIENT GOALS: less pain, join his wife at the gym-3 days a week  NEXT MD VISIT: unsure  OBJECTIVE:  Note: Objective measures were completed at Evaluation unless otherwise noted.  DIAGNOSTIC FINDINGS:  Not accessible in chart but Pt reports neck and back have signif arthritis  PATIENT SURVEYS:  Modified Oswestry:  MODIFIED OSWESTRY DISABILITY SCALE   Date: 07/03/24 Score 08/22/24  Pain intensity 3 =  Pain medication provides me with moderate relief from pain.   2. Personal care (washing, dressing, etc.) 1 =  I can take care of myself normally, but it increases my pain.   3. Lifting 4 = I can lift only very light weights   4. Walking 1 = Pain prevents me from walking more than 1 mile.   5. Sitting  3 =  Pain prevents me from sitting more than  hour.   6. Standing 3 =  Pain prevents me from standing more than 1/2 hour.   7. Sleeping 3 =  Even when I take pain medication, I sleep less than 4 hours.   8. Social Life 3 =  Pain prevents me from going out very often.   9. Traveling 4 = My pain restricts my travel to short necessary journeys under 1/2 hour.   10. Employment/ Homemaking 2 = I can perform most of my homemaking/job duties, but pain prevents me from performing more physically stressful activities (eg, lifting, vacuuming).   Total 27/50 9/50   Interpretation of scores: Score Category Description  0-20% Minimal Disability The patient can cope with most living activities. Usually no treatment is indicated apart from advice on lifting, sitting and exercise  21-40% Moderate Disability The patient experiences more pain and difficulty with sitting, lifting and standing. Travel and social life are more difficult and they may be disabled from work. Personal care, sexual activity and sleeping are not grossly affected, and the patient can usually be managed by conservative means  41-60% Severe Disability Pain remains the main problem in this group, but activities of daily living are affected. These patients require a detailed investigation  61-80% Crippled Back pain impinges on all aspects of the patient's life. Positive intervention is required  81-100% Bed-bound  These patients are either bed-bound or exaggerating their symptoms  Bluford FORBES Zoe DELENA Karon DELENA, et al. Surgery versus conservative management of stable thoracolumbar fracture: the PRESTO feasibility RCT. [place] ([place]): [employer]; 2021 Nov. [facility] Technology Assessment, No. 25.62.) Appendix 3, Oswestry Disability Index category descriptors. Available from: Findjewelers.cz  Minimally Clinically Important Difference (MCID) = 12.8%  COGNITION: Overall cognitive status: Pt reports he has  had STM challenges lately     SENSATION: Pt reports tingling in bil neck and shoulders  MUSCLE LENGTH: Hamstrings: Right 65 deg; Left 65 deg Quads: limited 25% bil Piriformis: limited 25% bil  POSTURE: No Significant postural limitations  PALPATION: Tender Rt facets L4/5, L5/S1 Tender bil facets C5-C7 Trigger points present: deep cervical multifidi lower cervical spine and upper traps bil  LUMBAR ROM:   AROM eval  Flexion Hands to ankles with LBP  Extension Full with pain  Right lateral flexion Full with Rt pain  Left lateral flexion Full with Rt pain  Right rotation 75% with pain  Left rotation 75% with pain   (Blank rows = not tested)  LOWER EXTREMITY ROM:    Limited end range knee flexion in prone secondary to tight quads Limited end range hip flexion secondary to tight gluteals Good hip IR/ER  LOWER EXTREMITY MMT:   Hips 4/5 bil all planes Poor TA stabilization during LE MMT   GAIT: No major gait deviations  CERVICAL ROM:   Active ROM A/PROM (deg) eval A/ROM 10/27 AROM 08/22/24  Flexion 30 45 WFL  Extension 30 40 WFL  Right lateral flexion 25 40 WFL  Left lateral flexion 25 42 WFL  Right rotation 40 55 WFL  Left rotation 50 60 WFL  Pain all motions above  UPPER EXTREMITY ROM: WFL with end range stiffness and pain at full elevation  UPPER EXTREMITY MMT: Cervical: strong and painful when activating Rt musculature Bil UE strength: weak middle and lower traps 4/5 bil    TREATMENT DATE:  08/22/24 UBE x 6 min level 1 3/3  PT present to discuss status 3 way scapular stabilization with yellow loop x 5 each side 4D ball rolls x 20 each direction on each UE with  yellow  Standing shoulder flexion and scaption with 5 lbs 2 x 10 each (no compensatory issues on either direction) Standing shoulder shrugs with hex bar x 20 Prone wing taps 2 x 10 Prone reach and pull 2 x 10 MELT for C spine:  rotation x 20, flex/ext x 20, figure 8's x 20 each side Manual C  spine PROM, upper trap and levator stretches, sub-occipital release  Seated push ups on Teclor bars 2 x 10   08/17/24 Nustep x 6 min level 5 PT present to discuss status Standing hamstring stretch 3 x 30 Standing quad/hip flexor stretch 3 x 30 Seated piriformis stretch 3 x 30 Supine PPT x 20 PPT with 90/90 heel tap x 20 PPT with dying bug x 20 PPT with swiss ball pass 2 x 10 Prone plank x 3 (10 sec, 15 sec, 20 sec) Ice to lumbar spine x 10 min at end of session in hook lying  08/15/24 UBE x 6 min 3/3 Standing hamstring stretch 3 x 30 Standing quad/hip flexor stretch 3 x 30 Seated piriformis stretch 3 x 30 Supine PPT x 20 PPT with 90/90 heel tap x 20 PPT with dying bug x 20 Trigger Point Dry Needling Subsequent Treatment: Instructions provided previously at initial dry needling treatment.  Patient Verbal Consent Given: Yes Education Handout Provided: Previously Provided Muscles Treated: lumbar multifidi Electrical Stimulation Performed: No Treatment Response/Outcome: Skilled palpation used to identify taut bands and trigger points.  Once identified, dry needling techniques used to treat these areas.  Twitch response ellicited along with palpable elongation of muscle.  Following treatment, patient reported   PATIENT EDUCATION:  Education details: [email or web address] Person educated: Patient Education method: Explanation, Demonstration, Verbal cues, and Handouts Education comprehension: verbalized understanding and returned demonstration  HOME EXERCISE PROGRAM: Access Code: [email or web address] URL: [email or web address] Date: 07/31/2024 Prepared by: Orvil Beuhring  Exercises - Supine Lower Trunk Rotation  - 1 x daily - 7 x weekly - 1 sets - 10 reps - 5 hold - Supine Posterior Pelvic Tilt  - 1 x daily - 7 x weekly - 1 sets - 10 reps - 5 hold - Supine Cervical Retraction with Towel  - 1 x daily - 7 x weekly - 1 sets - 10 reps - 5 hold - Hooklying Single Knee to Chest  Stretch  - 1 x daily - 7 x weekly - 1 sets - 3 reps - 20 hold - Supine Piriformis Stretch with Foot on Ground  - 1 x daily - 7 x weekly - 1 sets - 3 reps - 20 hold - Standing Row with Anchored Resistance  - 1 x daily - 7 x weekly - 2 sets - 10 reps - Standing Hamstring Stretch on Chair  - 1 x daily - 7 x weekly - 1 sets - 3 reps - 30 sec hold - Quadricep Stretch with Chair and Counter Support  - 1 x daily - 7 x weekly - 1 sets - 3 reps - 30 sec hold - Seated Figure 4 Piriformis Stretch  - 1 x daily - 7 x weekly - 1 sets - 3 reps - 30 sed hold - Supine 90/90 Alternating Heel Touches with Posterior Pelvic Tilt  - 1 x daily - 7 x weekly - 3 sets - 10 reps - Supine Dead Bug with Leg Extension  - 1 x daily - 7 x weekly - 3 sets - 10 reps - Supine Swiss Ball Handoff  - 1 x daily - 7 x weekly -  3 sets - 10 reps - Sidelying Open Book Thoracic Rotation with Knee on Foam Roll  - 1 x daily - 7 x weekly - 1 sets - 10 reps ASSESSMENT:  CLINICAL IMPRESSION: Tayvien arrived with minimal c/o, actually states I'm doing pretty good today, but his pain level reports are worse today.  He completes all tasks with ease other than the 2 prone exercises.  He does fatigue quite easily with these.  He had good passive C spine ROM today.  Oswestry score was significantly improved.  He should continue to do well.  He would benefit from continued skilled PT to progress toward goals below.    OBJECTIVE IMPAIRMENTS: decreased activity tolerance, decreased mobility, decreased ROM, decreased strength, hypomobility, increased muscle spasms, impaired flexibility, impaired sensation, improper body mechanics, and pain.   ACTIVITY LIMITATIONS: lifting, bending, sitting, standing, squatting, sleeping, and locomotion level  PARTICIPATION LIMITATIONS: cleaning, driving, shopping, community activity, and yard work  PERSONAL FACTORS: 1 comorbidity: Lt ankle arthrodesis are also affecting patient's functional outcome.   REHAB POTENTIAL:  Excellent  CLINICAL DECISION MAKING: Evolving/moderate complexity  EVALUATION COMPLEXITY: Moderate   GOALS: Goals reviewed with patient? Yes  SHORT TERM GOALS: Target date: 07/31/24  Pt will be ind with initial HEP Baseline: Goal status: MET 10/27  2.  Pt will report reduced pain with daily activities by at least 25% Baseline:  Goal status: MET 08/22/24  3.  Pt will be able to sit up to 30 min without increased lumbar pain Baseline: 08/03/24 can do 10-15 min Goal status: MET 08/22/24  4.  Pt will improve neck rotation to at least 55 deg bil without sharp pain Baseline:  Goal status: MET 10/27  5.  Pt will be able to flex trunk to reach item on ground without increased back pain Baseline:  Goal status: MET 08/17/24    LONG TERM GOALS: Target date: 08/28/24  Pt will be ind with advanced HEP Baseline:  Goal status: In progress  2.  Pt will improve ODI to </= 15/50 (30%) to demo reduced disability level Baseline:  Goal status: MET 08/22/24  3.  Pt will be able to drive without any sharp neck pains when checking blind spots Baseline:  Goal status: In Progress  4.  Pt will report improved pain in cervical spine to no greater than 3/10 most days of the week Baseline: 5-8/10 Goal status: In progress  5.  Pt will report improved pain in lumbar spine to no greater than 3/10 with daily tasks most days of the week Baseline:  Goal status: In progress  PLAN:  PT FREQUENCY: 2x/week  PT DURATION: 8 weeks  PLANNED INTERVENTIONS: 97110-Therapeutic exercises, 97530- Therapeutic activity, 97112- Neuromuscular re-education, 97535- Self Care, [id number]- Manual therapy, [phone]- Aquatic Therapy, [phone]- Electrical stimulation (unattended), [phone]- Electrical stimulation (manual), [id number]- Traction (mechanical), [phone] (1-2 muscles), [id number] (3+ muscles)- Dry Needling, Patient/Family education, Taping, Joint mobilization, Spinal mobilization, Cryotherapy, and Moist heat.  PLAN FOR NEXT  SESSION: continue postural and core stabilization, left shoulder IR/ER strengthening, review and progress HEP for LE and neck stretching, trunk and neck stabilization, hip strength bil  Mozella Rexrode B. Caretha Rumbaugh, PT 08/22/24 2:50 PM [facility] [address], [address] [place], [place] [address] Phone # [phone] Fax [phone]

## 2024-08-24 ENCOUNTER — Ambulatory Visit

## 2024-08-24 DIAGNOSIS — M542 Cervicalgia: Secondary | ICD-10-CM

## 2024-08-24 DIAGNOSIS — R293 Abnormal posture: Secondary | ICD-10-CM

## 2024-08-24 DIAGNOSIS — R252 Cramp and spasm: Secondary | ICD-10-CM

## 2024-08-24 DIAGNOSIS — M5459 Other low back pain: Secondary | ICD-10-CM

## 2024-08-24 DIAGNOSIS — M6281 Muscle weakness (generalized): Secondary | ICD-10-CM

## 2024-08-24 DIAGNOSIS — R262 Difficulty in walking, not elsewhere classified: Secondary | ICD-10-CM

## 2024-08-24 NOTE — Therapy (Signed)
 OUTPATIENT PHYSICAL THERAPY THORACOLUMBAR TREATMENT NOTE PHYSICAL THERAPY DISCHARGE SUMMARY  Visits from Start of Care: 11  Current functional level related to goals / functional outcomes: See below   Remaining deficits: See below   Education / Equipment: See below   Patient agrees to discharge. Patient goals were met. Patient is being discharged due to meeting the stated rehab goals.      Patient Name: Mccrae Speciale MRN: [id number] DOB:[birth year], 63 y.o., male Today's Date: 08/24/2024  END OF SESSION:  PT End of Session - 08/24/24 1105     Visit Number 11    Date for Recertification  08/28/24    Authorization Type [employer] Medicare    Progress Note Due on Visit 10    PT Start Time 1057    PT Stop Time 1135    PT Time Calculation (min) 38 min    Activity Tolerance Patient tolerated treatment well    Behavior During Therapy WFL for tasks assessed/performed            Past Medical History:  Diagnosis Date   Arthritis    had it in my left ankle (05/04/2017)   Environmental and seasonal allergies 06/07/2015   Fibrosis of subtalar joint, 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   Mild cognitive impairment of uncertain or unknown etiology 09/11/2021   Mixed hyperlipidemia 06/14/2016   Pain in left ankle and joints of left foot 06/17/2017   Pain in rectum    Pneumonia 2017   PTSD (post-traumatic stress disorder) 01/30/2020   Slow transit constipation 04/02/2021   Urinary incontinence    Past Surgical History:  Procedure Laterality Date   ANKLE ARTHROSCOPY Left ~ 2013    scraped out arthritis   ANKLE ARTHROSCOPY WITH FUSION Left 09/22/2017   Procedure: LEFT ANKLE POSTERIOR ARTHROSCOPIC SUBTALAR ARTHRODESIS;  Surgeon: Harden Jerona GAILS, MD;  Location[facility] OR;  Service: Orthopedics;  Laterality: Left;   ANKLE FRACTURE SURGERY Left 12/1980   APPLICATION OF WOUND VAC Right 05/03/2017   foot   APPLICATION OF WOUND VAC Right 05/03/2017   Procedure: APPLICATION OF WOUND VAC;  Surgeon: Barbarann Oneil BROCKS, MD;  Location: [facility] OR;  Service: Orthopedics;  Laterality: Right;   FRACTURE SURGERY     I & D EXTREMITY Right 05/03/2017   foot   I & D EXTREMITY Right 05/03/2017   Procedure: IRRIGATION AND DEBRIDEMENT EXTREMITY RIGHT, REPAIR OF POSTERIOR TIBIAL TENDON;  Surgeon: Barbarann Oneil BROCKS, MD;  Location: [facility] OR;  Service: Orthopedics;  Laterality: Right;   LAPAROSCOPIC CHOLECYSTECTOMY  1996?   POSTERIOR TIBIAL TENDON REPAIR Right 05/03/2017   SUBTALAR JOINT ARTHROEREISIS Left 1985?   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   Mixed hyperlipidemia 06/14/2016   Environmental and seasonal allergies 06/07/2015   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   Generalized anxiety disorder 06/07/2015    PCP: Ozell Reilly, MD  REFERRING PROVIDER: Donata Snowman, PA-C  REFERRING DIAG: [phone] (ICD-10-CM) - Spondylosis without myelopathy or radiculopathy, cervical region M47.816 (ICD-10-CM) - Spondylosis without myelopathy or radiculopathy, lumbar region  Rationale for Evaluation  and Treatment: Rehabilitation  THERAPY DIAG:  Cervicalgia  Other low back pain  Muscle weakness (generalized)  Cramp and spasm  Abnormal posture  Difficulty in walking, not elsewhere classified  ONSET DATE: 3 mos ago - fell  SUBJECTIVE:                                                                                                                                                                                           SUBJECTIVE STATEMENT: Patient reports he is doing well.  Denies any pain today and feels he is ready for DC.    PERTINENT HISTORY:  Lt ankle arthrodesis, PTSD, anxiety, depression  PAIN:   08/22/24 Are you having pain? Yes NPRS scale: 0/10 neck, 0/10 lumbar Pain location: bil neck, central lumbar Pain orientation: Medial and Lower (lumbar), base of skull and down to shoulders bil (cervical) PAIN TYPE: aching, dull, (lumbar) and sharp (neck) Pain description: constant  Aggravating factors: Cervical - sleep (is a stomach sleeper), turning head. Lumbar - sitting Relieving factors: unsure   PRECAUTIONS: None  RED FLAGS: None   WEIGHT BEARING RESTRICTIONS: No  FALLS:  Has patient fallen in last 6 months? Yes. Number of falls 1  LIVING ENVIRONMENT: Lives with: lives with their spouse Lives in: House/apartment Stairs: Yes: Internal: 16 steps; on right going up and on left going up and External: 5 steps; can reach both Has following equipment at home: None  OCCUPATION: retired, swimming at [employer]   PLOF: Independent  PATIENT GOALS: less pain, join his wife at the gym-3 days a week  NEXT MD VISIT: unsure  OBJECTIVE:  Note: Objective measures were completed at Evaluation unless otherwise noted.  DIAGNOSTIC FINDINGS:  Not accessible in chart but Pt reports neck and back have signif arthritis  PATIENT SURVEYS:  Modified Oswestry:  MODIFIED OSWESTRY DISABILITY SCALE   Date: 07/03/24 Score 08/22/24  Pain intensity 3 =  Pain medication provides me with  moderate relief from pain.   2. Personal care (washing, dressing, etc.) 1 =  I can take care of myself normally, but it increases my  pain.   3. Lifting 4 = I can lift only very light weights   4. Walking 1 = Pain prevents me from walking more than 1 mile.   5. Sitting 3 =  Pain prevents me from sitting more than  hour.   6. Standing 3 =  Pain prevents me from standing more than 1/2 hour.   7. Sleeping 3 =  Even when I take pain medication, I sleep less than 4 hours.   8. Social Life 3 =  Pain prevents me from going out very often.   9. Traveling 4 = My pain restricts my travel to short necessary journeys under 1/2 hour.   10. Employment/ Homemaking 2 = I can perform most of my homemaking/job duties, but pain prevents me from performing more physically stressful activities (eg, lifting, vacuuming).   Total 27/50 9/50   Interpretation of scores: Score Category Description  0-20% Minimal Disability The patient can cope with most living activities. Usually no treatment is indicated apart from advice on lifting, sitting and exercise  21-40% Moderate Disability The patient experiences more pain and difficulty with sitting, lifting and standing. Travel and social life are more difficult and they may be disabled from work. Personal care, sexual activity and sleeping are not grossly affected, and the patient can usually be managed by conservative means  41-60% Severe Disability Pain remains the main problem in this group, but activities of daily living are affected. These patients require a detailed investigation  61-80% Crippled Back pain impinges on all aspects of the patient's life. Positive intervention is required  81-100% Bed-bound  These patients are either bed-bound or exaggerating their symptoms  Bluford FORBES Zoe DELENA Karon DELENA, et al. Surgery versus conservative management of stable thoracolumbar fracture: the PRESTO feasibility RCT. [place] ([place]): [employer]; 2021 Nov. [facility]  Technology Assessment, No. 25.62.) Appendix 3, Oswestry Disability Index category descriptors. Available from: [email or web address]  Minimally Clinically Important Difference (MCID) = 12.8%  COGNITION: Overall cognitive status: Pt reports he has had STM challenges lately     SENSATION: Pt reports tingling in bil neck and shoulders  MUSCLE LENGTH: Hamstrings: Right 65 deg; Left 65 deg Quads: limited 25% bil Piriformis: limited 25% bil  POSTURE: No Significant postural limitations  PALPATION: Tender Rt facets L4/5, L5/S1 Tender bil facets C5-C7 Trigger points present: deep cervical multifidi lower cervical spine and upper traps bil  LUMBAR ROM:   AROM eval 08/24/24  Flexion Hands to ankles with LBP Hands to floor with no pain  Extension Full with pain Full with mild discomfort  Right lateral flexion Full with Rt pain Full without pain  Left lateral flexion Full with Rt pain Full without pain  Right rotation 75% with pain Full without pain  Left rotation 75% with pain Full without pain   (Blank rows = not tested)  LOWER EXTREMITY ROM:    Limited end range knee flexion in prone secondary to tight quads Limited end range hip flexion secondary to tight gluteals Good hip IR/ER  LOWER EXTREMITY MMT:   Hips 4/5 bil all planes Poor TA stabilization during LE MMT   GAIT: No major gait deviations  CERVICAL ROM:   Active ROM A/PROM (deg) eval A/ROM 10/27 AROM 08/22/24  Flexion 30 45 WFL  Extension 30 40 WFL  Right lateral flexion 25 40 WFL  Left lateral flexion 25 42 WFL  Right rotation 40 55 WFL  Left rotation 50 60 WFL  Pain all motions above 08/24/24: no pain  on any of the C spine motions but rotation right was a little uncomfortable  UPPER EXTREMITY ROM: WFL with end range stiffness and pain at full elevation  UPPER EXTREMITY MMT: Cervical: strong and painful when activating Rt musculature Bil UE strength: weak middle and lower traps  4/5 bil    TREATMENT DATE:  08/24/24 Nustep x 6 min level 5 DC assessment completed Educated on DC plan and how to progress.  Important take aways including consistency with HEP, proper posture and use of good body mechanics Reviewed entire HEP and updated for DC.    08/22/24 UBE x 6 min level 1 3/3  PT present to discuss status 3 way scapular stabilization with yellow loop x 5 each side 4D ball rolls x 20 each direction on each UE with yellow  Standing shoulder flexion and scaption with 5 lbs 2 x 10 each (no compensatory issues on either direction) Standing shoulder shrugs with hex bar x 20 Prone wing taps 2 x 10 Prone reach and pull 2 x 10 MELT for C spine:  rotation x 20, flex/ext x 20, figure 8's x 20 each side Manual C spine PROM, upper trap and levator stretches, sub-occipital release  Seated push ups on Teclor bars 2 x 10   08/17/24 Nustep x 6 min level 5 PT present to discuss status Standing hamstring stretch 3 x 30 Standing quad/hip flexor stretch 3 x 30 Seated piriformis stretch 3 x 30 Supine PPT x 20 PPT with 90/90 heel tap x 20 PPT with dying bug x 20 PPT with swiss ball pass 2 x 10 Prone plank x 3 (10 sec, 15 sec, 20 sec) Ice to lumbar spine x 10 min at end of session in hook lying    PATIENT EDUCATION:  Education details: [email or web address] Person educated: Patient Education method: Programmer, Multimedia, Demonstration, Verbal cues, and Handouts Education comprehension: verbalized understanding and returned demonstration  HOME EXERCISE PROGRAM: Access Code: [email or web address] URL: [email or web address] Date: 07/31/2024 Prepared by: Orvil Beuhring  Exercises - Supine Lower Trunk Rotation  - 1 x daily - 7 x weekly - 1 sets - 10 reps - 5 hold - Supine Posterior Pelvic Tilt  - 1 x daily - 7 x weekly - 1 sets - 10 reps - 5 hold - Supine Cervical Retraction with Towel  - 1 x daily - 7 x weekly - 1 sets - 10 reps - 5 hold - Hooklying Single Knee to Chest Stretch  -  1 x daily - 7 x weekly - 1 sets - 3 reps - 20 hold - Supine Piriformis Stretch with Foot on Ground  - 1 x daily - 7 x weekly - 1 sets - 3 reps - 20 hold - Standing Row with Anchored Resistance  - 1 x daily - 7 x weekly - 2 sets - 10 reps - Standing Hamstring Stretch on Chair  - 1 x daily - 7 x weekly - 1 sets - 3 reps - 30 sec hold - Quadricep Stretch with Chair and Counter Support  - 1 x daily - 7 x weekly - 1 sets - 3 reps - 30 sec hold - Seated Figure 4 Piriformis Stretch  - 1 x daily - 7 x weekly - 1 sets - 3 reps - 30 sed hold - Supine 90/90 Alternating Heel Touches with Posterior Pelvic Tilt  - 1 x daily - 7 x weekly - 3 sets - 10 reps - Supine Dead Bug with Leg Extension  - 1 x daily -  7 x weekly - 3 sets - 10 reps - Supine Swiss Ball Handoff  - 1 x daily - 7 x weekly - 3 sets - 10 reps - Sidelying Open Book Thoracic Rotation with Knee on Foam Roll  - 1 x daily - 7 x weekly - 1 sets - 10 reps ASSESSMENT:  CLINICAL IMPRESSION: Pacey has met all goals.  He is independent and compliant with his HEP.  Objective findings are all improved and he reports no pain since last visit.  We will DC at this time.    OBJECTIVE IMPAIRMENTS: decreased activity tolerance, decreased mobility, decreased ROM, decreased strength, hypomobility, increased muscle spasms, impaired flexibility, impaired sensation, improper body mechanics, and pain.   ACTIVITY LIMITATIONS: lifting, bending, sitting, standing, squatting, sleeping, and locomotion level  PARTICIPATION LIMITATIONS: cleaning, driving, shopping, community activity, and yard work  PERSONAL FACTORS: 1 comorbidity: Lt ankle arthrodesis are also affecting patient's functional outcome.   REHAB POTENTIAL: Excellent  CLINICAL DECISION MAKING: Evolving/moderate complexity  EVALUATION COMPLEXITY: Moderate   GOALS: Goals reviewed with patient? Yes  SHORT TERM GOALS: Target date: 07/31/24  Pt will be ind with initial HEP Baseline: Goal status: MET  10/27  2.  Pt will report reduced pain with daily activities by at least 25% Baseline:  Goal status: MET 08/22/24  3.  Pt will be able to sit up to 30 min without increased lumbar pain Baseline: 111/18/25 can do 30+ min Goal status: MET 08/22/24  4.  Pt will improve neck rotation to at least 55 deg bil without sharp pain Baseline:  Goal status: MET 10/27  5.  Pt will be able to flex trunk to reach item on ground without increased back pain Baseline:  Goal status: MET 08/17/24    LONG TERM GOALS: Target date: 08/28/24  Pt will be ind with advanced HEP Baseline:  Goal status: MET 08/24/24  2.  Pt will improve ODI to </= 15/50 (30%) to demo reduced disability level Baseline:  Goal status: MET 08/22/24  3.  Pt will be able to drive without any sharp neck pains when checking blind spots Baseline: a little sore at times but tolerable Goal status: In Progress  4.  Pt will report improved pain in cervical spine to no greater than 3/10 most days of the week Baseline: 0/10 Goal status: MET 08/24/24  5.  Pt will report improved pain in lumbar spine to no greater than 3/10 with daily tasks most days of the week Baseline:  Goal status: MET  PLAN:  PT FREQUENCY: 2x/week  PT DURATION: 8 weeks  PLANNED INTERVENTIONS: 97110-Therapeutic exercises, 97530- Therapeutic activity, 97112- Neuromuscular re-education, 97535- Self Care, 02859- Manual therapy, [phone]- Aquatic Therapy, [id number]- Electrical stimulation (unattended), [phone]- Electrical stimulation (manual), [id number]- Traction (mechanical), 20560 (1-2 muscles), 20561 (3+ muscles)- Dry Needling, Patient/Family education, Taping, Joint mobilization, Spinal mobilization, Cryotherapy, and Moist heat.  PLAN FOR NEXT SESSION: DC  Drevion Offord B. Bassel Gaskill, PT 08/24/24 11:44 AM [facility] [address], [address] [place], [place] [address] Phone # [phone] Fax [phone]
